# Patient Record
Sex: Female | Born: 1960
Health system: Southern US, Community
[De-identification: ages and names within clinical notes are randomized; demographics above are authoritative.]

## PROBLEM LIST (undated history)

## (undated) DIAGNOSIS — I10 Essential (primary) hypertension: Secondary | ICD-10-CM

## (undated) DIAGNOSIS — E785 Hyperlipidemia, unspecified: Secondary | ICD-10-CM

## (undated) DIAGNOSIS — M1990 Unspecified osteoarthritis, unspecified site: Secondary | ICD-10-CM

## (undated) DIAGNOSIS — M2550 Pain in unspecified joint: Secondary | ICD-10-CM

## (undated) DIAGNOSIS — T7840XA Allergy, unspecified, initial encounter: Secondary | ICD-10-CM

## (undated) DIAGNOSIS — K5900 Constipation, unspecified: Secondary | ICD-10-CM

## (undated) HISTORY — PX: COLONOSCOPY: SHX174

## (undated) HISTORY — DX: Allergy, unspecified, initial encounter: T78.40XA

## (undated) HISTORY — DX: Pain in unspecified joint: M25.50

## (undated) HISTORY — DX: Constipation, unspecified: K59.00

## (undated) HISTORY — DX: Hyperlipidemia, unspecified: E78.5

## (undated) HISTORY — DX: Unspecified osteoarthritis, unspecified site: M19.90

## (undated) HISTORY — DX: Essential (primary) hypertension: I10

---

## 2009-03-14 HISTORY — PX: CHOLECYSTECTOMY: SHX55

## 2011-12-15 ENCOUNTER — Encounter: Payer: Self-pay | Admitting: Family Medicine

## 2011-12-15 ENCOUNTER — Other Ambulatory Visit (HOSPITAL_COMMUNITY)
Admission: RE | Admit: 2011-12-15 | Discharge: 2011-12-15 | Disposition: A | Discharge status: Home / Self Care | Payer: 59 | Source: Ambulatory Visit | Attending: Family Medicine | Admitting: Family Medicine

## 2011-12-15 ENCOUNTER — Ambulatory Visit (INDEPENDENT_AMBULATORY_CARE_PROVIDER_SITE_OTHER): Payer: 59 | Admitting: Family Medicine

## 2011-12-15 VITALS — BP 132/88 | HR 57 | Temp 98.1°F | Ht 64.5 in | Wt 160.6 lb

## 2011-12-15 DIAGNOSIS — E785 Hyperlipidemia, unspecified: Secondary | ICD-10-CM

## 2011-12-15 DIAGNOSIS — Z01419 Encounter for gynecological examination (general) (routine) without abnormal findings: Secondary | ICD-10-CM | POA: Insufficient documentation

## 2011-12-15 DIAGNOSIS — Z124 Encounter for screening for malignant neoplasm of cervix: Secondary | ICD-10-CM

## 2011-12-15 DIAGNOSIS — Z0000 Encounter for general adult medical examination without abnormal findings: Secondary | ICD-10-CM

## 2011-12-15 DIAGNOSIS — I10 Essential (primary) hypertension: Secondary | ICD-10-CM

## 2011-12-15 LAB — BASIC METABOLIC PANEL
BUN: 15 mg/dL (ref 6–23)
CO2: 30 mEq/L (ref 19–32)
Calcium: 9.2 mg/dL (ref 8.4–10.5)
Creatinine, Ser: 0.9 mg/dL (ref 0.4–1.2)
GFR: 67.79 mL/min (ref >60.00)
Glucose, Bld: 83 mg/dL (ref 70–99)

## 2011-12-15 LAB — POCT URINALYSIS DIPSTICK
Blood, UA: NEGATIVE
Nitrite, UA: NEGATIVE
Protein, UA: NEGATIVE
Spec Grav, UA: <=1.005
Urobilinogen, UA: 0.2

## 2011-12-15 LAB — CBC WITH DIFFERENTIAL/PLATELET
Basophils Absolute: 0 10*3/uL (ref 0.0–0.1)
Eosinophils Absolute: 0.1 10*3/uL (ref 0.0–0.7)
Hemoglobin: 13.8 g/dL (ref 12.0–15.0)
Lymphocytes Relative: 33.5 % (ref 12.0–46.0)
MCHC: 33.5 g/dL (ref 30.0–36.0)
Monocytes Relative: 7.1 % (ref 3.0–12.0)
Neutro Abs: 2.8 10*3/uL (ref 1.4–7.7)
Neutrophils Relative %: 56.7 % (ref 43.0–77.0)
Platelets: 210 10*3/uL (ref 150.0–400.0)
RDW: 13 % (ref 11.5–14.6)

## 2011-12-15 LAB — LDL CHOLESTEROL, DIRECT: Direct LDL: 159.4 mg/dL

## 2011-12-15 LAB — HEPATIC FUNCTION PANEL
Albumin: 3.9 g/dL (ref 3.5–5.2)
Alkaline Phosphatase: 87 U/L (ref 39–117)
Total Bilirubin: 0.9 mg/dL (ref 0.3–1.2)

## 2011-12-15 LAB — LIPID PANEL
HDL: 49 mg/dL (ref >39.00)
Total CHOL/HDL Ratio: 5
VLDL: 23 mg/dL (ref 0.0–40.0)

## 2011-12-15 LAB — MICROALBUMIN / CREATININE URINE RATIO: Microalb, Ur: 0.2 mg/dL (ref 0.0–1.9)

## 2011-12-15 NOTE — Patient Instructions (Signed)
Preventive Care for Adults, Female A healthy lifestyle and preventive care can promote health and wellness. Preventive health guidelines for women include the following key practices.  A routine yearly physical is a good way to check with your caregiver about your health and preventive screening. It is a chance to share any concerns and updates on your health, and to receive a thorough exam.  Visit your dentist for a routine exam and preventive care every 6 months. Brush your teeth twice a day and floss once a day. Good oral hygiene prevents tooth decay and gum disease.  The frequency of eye exams is based on your age, health, family medical history, use of contact lenses, and other factors. Follow your caregiver's recommendations for frequency of eye exams.  Eat a healthy diet. Foods like vegetables, fruits, whole grains, low-fat dairy products, and lean protein foods contain the nutrients you need without too many calories. Decrease your intake of foods high in solid fats, added sugars, and salt. Eat the right amount of calories for you.Get information about a proper diet from your caregiver, if necessary.  Regular physical exercise is one of the most important things you can do for your health. Most adults should get at least 150 minutes of moderate-intensity exercise (any activity that increases your heart rate and causes you to sweat) each week. In addition, most adults need muscle-strengthening exercises on 2 or more days a week.  Maintain a healthy weight. The body mass index (BMI) is a screening tool to identify possible weight problems. It provides an estimate of body fat based on height and weight. Your caregiver can help determine your BMI, and can help you achieve or maintain a healthy weight.For adults 20 years and older:  A BMI below 18.5 is considered underweight.  A BMI of 18.5 to 24.9 is normal.  A BMI of 25 to 29.9 is considered overweight.  A BMI of 30 and above is  considered obese.  Maintain normal blood lipids and cholesterol levels by exercising and minimizing your intake of saturated fat. Eat a balanced diet with plenty of fruit and vegetables. Blood tests for lipids and cholesterol should begin at age 20 and be repeated every 5 years. If your lipid or cholesterol levels are high, you are over 50, or you are at high risk for heart disease, you may need your cholesterol levels checked more frequently.Ongoing high lipid and cholesterol levels should be treated with medicines if diet and exercise are not effective.  If you smoke, find out from your caregiver how to quit. If you do not use tobacco, do not start.  If you are pregnant, do not drink alcohol. If you are breastfeeding, be very cautious about drinking alcohol. If you are not pregnant and choose to drink alcohol, do not exceed 1 drink per day. One drink is considered to be 12 ounces (355 mL) of beer, 5 ounces (148 mL) of wine, or 1.5 ounces (44 mL) of liquor.  Avoid use of street drugs. Do not share needles with anyone. Ask for help if you need support or instructions about stopping the use of drugs.  High blood pressure causes heart disease and increases the risk of stroke. Your blood pressure should be checked at least every 1 to 2 years. Ongoing high blood pressure should be treated with medicines if weight loss and exercise are not effective.  If you are 55 to 53 years old, ask your caregiver if you should take aspirin to prevent strokes.  Diabetes   screening involves taking a blood sample to check your fasting blood sugar level. This should be done once every 3 years, after age 45, if you are within normal weight and without risk factors for diabetes. Testing should be considered at a younger age or be carried out more frequently if you are overweight and have at least 1 risk factor for diabetes.  Breast cancer screening is essential preventive care for women. You should practice "breast  self-awareness." This means understanding the normal appearance and feel of your breasts and may include breast self-examination. Any changes detected, no matter how small, should be reported to a caregiver. Women in their 20s and 30s should have a clinical breast exam (CBE) by a caregiver as part of a regular health exam every 1 to 3 years. After age 40, women should have a CBE every year. Starting at age 40, women should consider having a mammography (breast X-ray test) every year. Women who have a family history of breast cancer should talk to their caregiver about genetic screening. Women at a high risk of breast cancer should talk to their caregivers about having magnetic resonance imaging (MRI) and a mammography every year.  The Pap test is a screening test for cervical cancer. A Pap test can show cell changes on the cervix that might become cervical cancer if left untreated. A Pap test is a procedure in which cells are obtained and examined from the lower end of the uterus (cervix).  Women should have a Pap test starting at age 21.  Between ages 21 and 29, Pap tests should be repeated every 2 years.  Beginning at age 30, you should have a Pap test every 3 years as long as the past 3 Pap tests have been normal.  Some women have medical problems that increase the chance of getting cervical cancer. Talk to your caregiver about these problems. It is especially important to talk to your caregiver if a new problem develops soon after your last Pap test. In these cases, your caregiver may recommend more frequent screening and Pap tests.  The above recommendations are the same for women who have or have not gotten the vaccine for human papillomavirus (HPV).  If you had a hysterectomy for a problem that was not cancer or a condition that could lead to cancer, then you no longer need Pap tests. Even if you no longer need a Pap test, a regular exam is a good idea to make sure no other problems are  starting.  If you are between ages 65 and 70, and you have had normal Pap tests going back 10 years, you no longer need Pap tests. Even if you no longer need a Pap test, a regular exam is a good idea to make sure no other problems are starting.  If you have had past treatment for cervical cancer or a condition that could lead to cancer, you need Pap tests and screening for cancer for at least 20 years after your treatment.  If Pap tests have been discontinued, risk factors (such as a new sexual partner) need to be reassessed to determine if screening should be resumed.  The HPV test is an additional test that may be used for cervical cancer screening. The HPV test looks for the virus that can cause the cell changes on the cervix. The cells collected during the Pap test can be tested for HPV. The HPV test could be used to screen women aged 30 years and older, and should   be used in women of any age who have unclear Pap test results. After the age of 30, women should have HPV testing at the same frequency as a Pap test.  Colorectal cancer can be detected and often prevented. Most routine colorectal cancer screening begins at the age of 50 and continues through age 75. However, your caregiver may recommend screening at an earlier age if you have risk factors for colon cancer. On a yearly basis, your caregiver may provide home test kits to check for hidden blood in the stool. Use of a small camera at the end of a tube, to directly examine the colon (sigmoidoscopy or colonoscopy), can detect the earliest forms of colorectal cancer. Talk to your caregiver about this at age 50, when routine screening begins. Direct examination of the colon should be repeated every 5 to 10 years through age 75, unless early forms of pre-cancerous polyps or small growths are found.  Hepatitis C blood testing is recommended for all people born from 1945 through 1965 and any individual with known risks for hepatitis C.  Practice  safe sex. Use condoms and avoid high-risk sexual practices to reduce the spread of sexually transmitted infections (STIs). STIs include gonorrhea, chlamydia, syphilis, trichomonas, herpes, HPV, and human immunodeficiency virus (HIV). Herpes, HIV, and HPV are viral illnesses that have no cure. They can result in disability, cancer, and death. Sexually active women aged 25 and younger should be checked for chlamydia. Older women with new or multiple partners should also be tested for chlamydia. Testing for other STIs is recommended if you are sexually active and at increased risk.  Osteoporosis is a disease in which the bones lose minerals and strength with aging. This can result in serious bone fractures. The risk of osteoporosis can be identified using a bone density scan. Women ages 65 and over and women at risk for fractures or osteoporosis should discuss screening with their caregivers. Ask your caregiver whether you should take a calcium supplement or vitamin D to reduce the rate of osteoporosis.  Menopause can be associated with physical symptoms and risks. Hormone replacement therapy is available to decrease symptoms and risks. You should talk to your caregiver about whether hormone replacement therapy is right for you.  Use sunscreen with sun protection factor (SPF) of 30 or more. Apply sunscreen liberally and repeatedly throughout the day. You should seek shade when your shadow is shorter than you. Protect yourself by wearing long sleeves, pants, a wide-brimmed hat, and sunglasses year round, whenever you are outdoors.  Once a month, do a whole body skin exam, using a mirror to look at the skin on your back. Notify your caregiver of new moles, moles that have irregular borders, moles that are larger than a pencil eraser, or moles that have changed in shape or color.  Stay current with required immunizations.  Influenza. You need a dose every fall (or winter). The composition of the flu vaccine  changes each year, so being vaccinated once is not enough.  Pneumococcal polysaccharide. You need 1 to 2 doses if you smoke cigarettes or if you have certain chronic medical conditions. You need 1 dose at age 65 (or older) if you have never been vaccinated.  Tetanus, diphtheria, pertussis (Tdap, Td). Get 1 dose of Tdap vaccine if you are younger than age 65, are over 65 and have contact with an infant, are a healthcare worker, are pregnant, or simply want to be protected from whooping cough. After that, you need a Td   booster dose every 10 years. Consult your caregiver if you have not had at least 3 tetanus and diphtheria-containing shots sometime in your life or have a deep or dirty wound.  HPV. You need this vaccine if you are a woman age 26 or younger. The vaccine is given in 3 doses over 6 months.  Measles, mumps, rubella (MMR). You need at least 1 dose of MMR if you were born in 1957 or later. You may also need a second dose.  Meningococcal. If you are age 19 to 21 and a first-year college student living in a residence hall, or have one of several medical conditions, you need to get vaccinated against meningococcal disease. You may also need additional booster doses.  Zoster (shingles). If you are age 60 or older, you should get this vaccine.  Varicella (chickenpox). If you have never had chickenpox or you were vaccinated but received only 1 dose, talk to your caregiver to find out if you need this vaccine.  Hepatitis A. You need this vaccine if you have a specific risk factor for hepatitis A virus infection or you simply wish to be protected from this disease. The vaccine is usually given as 2 doses, 6 to 18 months apart.  Hepatitis B. You need this vaccine if you have a specific risk factor for hepatitis B virus infection or you simply wish to be protected from this disease. The vaccine is given in 3 doses, usually over 6 months. Preventive Services / Frequency Ages 19 to 39  Blood  pressure check.** / Every 1 to 2 years.  Lipid and cholesterol check.** / Every 5 years beginning at age 20.  Clinical breast exam.** / Every 3 years for women in their 20s and 30s.  Pap test.** / Every 2 years from ages 21 through 29. Every 3 years starting at age 30 through age 65 or 70 with a history of 3 consecutive normal Pap tests.  HPV screening.** / Every 3 years from ages 30 through ages 65 to 70 with a history of 3 consecutive normal Pap tests.  Hepatitis C blood test.** / For any individual with known risks for hepatitis C.  Skin self-exam. / Monthly.  Influenza immunization.** / Every year.  Pneumococcal polysaccharide immunization.** / 1 to 2 doses if you smoke cigarettes or if you have certain chronic medical conditions.  Tetanus, diphtheria, pertussis (Tdap, Td) immunization. / A one-time dose of Tdap vaccine. After that, you need a Td booster dose every 10 years.  HPV immunization. / 3 doses over 6 months, if you are 26 and younger.  Measles, mumps, rubella (MMR) immunization. / You need at least 1 dose of MMR if you were born in 1957 or later. You may also need a second dose.  Meningococcal immunization. / 1 dose if you are age 19 to 21 and a first-year college student living in a residence hall, or have one of several medical conditions, you need to get vaccinated against meningococcal disease. You may also need additional booster doses.  Varicella immunization.** / Consult your caregiver.  Hepatitis A immunization.** / Consult your caregiver. 2 doses, 6 to 18 months apart.  Hepatitis B immunization.** / Consult your caregiver. 3 doses usually over 6 months. Ages 40 to 64  Blood pressure check.** / Every 1 to 2 years.  Lipid and cholesterol check.** / Every 5 years beginning at age 20.  Clinical breast exam.** / Every year after age 40.  Mammogram.** / Every year beginning at age 40   and continuing for as long as you are in good health. Consult with your  caregiver.  Pap test.** / Every 3 years starting at age 30 through age 65 or 70 with a history of 3 consecutive normal Pap tests.  HPV screening.** / Every 3 years from ages 30 through ages 65 to 70 with a history of 3 consecutive normal Pap tests.  Fecal occult blood test (FOBT) of stool. / Every year beginning at age 50 and continuing until age 75. You may not need to do this test if you get a colonoscopy every 10 years.  Flexible sigmoidoscopy or colonoscopy.** / Every 5 years for a flexible sigmoidoscopy or every 10 years for a colonoscopy beginning at age 50 and continuing until age 75.  Hepatitis C blood test.** / For all people born from 1945 through 1965 and any individual with known risks for hepatitis C.  Skin self-exam. / Monthly.  Influenza immunization.** / Every year.  Pneumococcal polysaccharide immunization.** / 1 to 2 doses if you smoke cigarettes or if you have certain chronic medical conditions.  Tetanus, diphtheria, pertussis (Tdap, Td) immunization.** / A one-time dose of Tdap vaccine. After that, you need a Td booster dose every 10 years.  Measles, mumps, rubella (MMR) immunization. / You need at least 1 dose of MMR if you were born in 1957 or later. You may also need a second dose.  Varicella immunization.** / Consult your caregiver.  Meningococcal immunization.** / Consult your caregiver.  Hepatitis A immunization.** / Consult your caregiver. 2 doses, 6 to 18 months apart.  Hepatitis B immunization.** / Consult your caregiver. 3 doses, usually over 6 months. Ages 65 and over  Blood pressure check.** / Every 1 to 2 years.  Lipid and cholesterol check.** / Every 5 years beginning at age 20.  Clinical breast exam.** / Every year after age 40.  Mammogram.** / Every year beginning at age 40 and continuing for as long as you are in good health. Consult with your caregiver.  Pap test.** / Every 3 years starting at age 30 through age 65 or 70 with a 3  consecutive normal Pap tests. Testing can be stopped between 65 and 70 with 3 consecutive normal Pap tests and no abnormal Pap or HPV tests in the past 10 years.  HPV screening.** / Every 3 years from ages 30 through ages 65 or 70 with a history of 3 consecutive normal Pap tests. Testing can be stopped between 65 and 70 with 3 consecutive normal Pap tests and no abnormal Pap or HPV tests in the past 10 years.  Fecal occult blood test (FOBT) of stool. / Every year beginning at age 50 and continuing until age 75. You may not need to do this test if you get a colonoscopy every 10 years.  Flexible sigmoidoscopy or colonoscopy.** / Every 5 years for a flexible sigmoidoscopy or every 10 years for a colonoscopy beginning at age 50 and continuing until age 75.  Hepatitis C blood test.** / For all people born from 1945 through 1965 and any individual with known risks for hepatitis C.  Osteoporosis screening.** / A one-time screening for women ages 65 and over and women at risk for fractures or osteoporosis.  Skin self-exam. / Monthly.  Influenza immunization.** / Every year.  Pneumococcal polysaccharide immunization.** / 1 dose at age 65 (or older) if you have never been vaccinated.  Tetanus, diphtheria, pertussis (Tdap, Td) immunization. / A one-time dose of Tdap vaccine if you are over   65 and have contact with an infant, are a healthcare worker, or simply want to be protected from whooping cough. After that, you need a Td booster dose every 10 years.  Varicella immunization.** / Consult your caregiver.  Meningococcal immunization.** / Consult your caregiver.  Hepatitis A immunization.** / Consult your caregiver. 2 doses, 6 to 18 months apart.  Hepatitis B immunization.** / Check with your caregiver. 3 doses, usually over 6 months. ** Family history and personal history of risk and conditions may change your caregiver's recommendations. Document Released: 04/26/2001 Document Revised: 05/23/2011  Document Reviewed: 07/26/2010 [employer] Patient Information 2013 [employer].  

## 2011-12-15 NOTE — Progress Notes (Signed)
Subjective:     Tami Monroe is a 51 y.o. female and is here for a comprehensive physical exam. The patient reports problems - mild headache-- she thought it might be her bp.----she also has calf spasms in R leg that come and go.   Usually at night but did occur 1x during day.  She could see the spasms.    History   Social History  . Marital Status: Single    Spouse Name: N/A    Number of Children: N/A  . Years of Education: N/A   Occupational History  . Not on file.   Social History Main Topics  . Smoking status: Never Smoker   . Smokeless tobacco: Never Used  . Alcohol Use: Yes     OCC  . Drug Use: No  . Sexually Active: Not on file   Other Topics Concern  . Not on file   Social History Narrative  . No narrative on file   No health maintenance topics applied.  The following portions of the patient's history were reviewed and updated as appropriate:  She  has a past medical history of Allergy; Hypertension; and Hyperlipidemia. She  does not have a problem list on file. She  has past surgical history that includes Cholecystectomy (2011). Her family history includes Alcohol abuse in her father; Arthritis in her mother; Cancer in an unspecified family member; Diabetes in her mother; Heart disease in her mother; Hyperlipidemia in her mother; Hypertension in her mother; and Lung cancer in her father. She  reports that she has never smoked. She has never used smokeless tobacco. She reports that she drinks alcohol. She reports that she does not use illicit drugs. She currently has no medications in their medication list. No current outpatient prescriptions on file prior to visit.   She  has no known allergies..  Review of Systems Review of Systems  Constitutional: Negative for activity change, appetite change and fatigue.  HENT: Negative for hearing loss, congestion, tinnitus and ear discharge.  dentist-- due Eyes: Negative for visual disturbance (see optho  q1y)--due Respiratory: Negative for cough, chest tightness and shortness of breath.   Cardiovascular: Negative for chest pain, palpitations and leg swelling.  Gastrointestinal: Negative for abdominal pain, diarrhea, constipation and abdominal distention.  Genitourinary: Negative for urgency, frequency, decreased urine volume and difficulty urinating.  Musculoskeletal: Negative for back pain, arthralgias and gait problem.  Skin: Negative for color change, pallor and rash.  Neurological: Negative for dizziness, light-headedness, numbness and headaches.  Hematological: Negative for adenopathy. Does not bruise/bleed easily.  Psychiatric/Behavioral: Negative for suicidal ideas, confusion, sleep disturbance, self-injury, dysphoric mood, decreased concentration and agitation.       Objective:    BP 132/88  Pulse 57  Temp 98.1 F (36.7 C) (Oral)  Ht 5' 4.5" (1.638 m)  Wt 160 lb 9.6 oz (72.848 kg)  BMI 27.14 kg/m2  SpO2 97% General appearance: alert, cooperative, appears stated age and no distress Head: Normocephalic, without obvious abnormality, atraumatic Eyes: negative findings: lids and lashes normal, conjunctivae and sclerae normal and pupils equal, round, reactive to light and accomodation, positive findings: na Ears: normal TM's and external ear canals both ears Nose: Nares normal. Septum midline. Mucosa normal. No drainage or sinus tenderness. Throat: lips, mucosa, and tongue normal; teeth and gums normal Neck: no adenopathy, no carotid bruit, no JVD, supple, symmetrical, trachea midline and thyroid not enlarged, symmetric, no tenderness/mass/nodules Back: symmetric, no curvature. ROM normal. No CVA tenderness. Lungs: clear to auscultation bilaterally Breasts:  normal appearance, no masses or tenderness Heart: regular rate and rhythm, S1, S2 normal, no murmur, click, rub or gallop Abdomen: soft, non-tender; bowel sounds normal; no masses,  no organomegaly Pelvic: cervix normal in  appearance, external genitalia normal, no adnexal masses or tenderness, no cervical motion tenderness, rectovaginal septum normal, uterus normal size, shape, and consistency and vagina normal without discharge Extremities: extremities normal, atraumatic, no cyanosis or edema Pulses: 2+ and symmetric Skin: Skin color, texture, turgor normal. No rashes or lesions Lymph nodes: Cervical, supraclavicular, and axillary nodes normal. Neurologic: Alert and oriented X 3, normal strength and tone. Normal symmetric reflexes. Normal coordination and gait psych-- no anxiety, depression    Assessment:    Healthy female exam.      Plan:    ghm utd Check labs See After Visit Summary for Counseling Recommendations

## 2011-12-20 ENCOUNTER — Telehealth: Payer: Self-pay

## 2011-12-20 NOTE — Telephone Encounter (Signed)
e

## 2012-01-10 ENCOUNTER — Ambulatory Visit (HOSPITAL_BASED_OUTPATIENT_CLINIC_OR_DEPARTMENT_OTHER)
Admission: RE | Admit: 2012-01-10 | Discharge: 2012-01-10 | Disposition: A | Discharge status: Home / Self Care | Payer: 59 | Source: Ambulatory Visit | Attending: Family Medicine | Admitting: Family Medicine

## 2012-01-10 DIAGNOSIS — Z1231 Encounter for screening mammogram for malignant neoplasm of breast: Secondary | ICD-10-CM | POA: Insufficient documentation

## 2012-01-10 DIAGNOSIS — Z0000 Encounter for general adult medical examination without abnormal findings: Secondary | ICD-10-CM

## 2012-06-19 ENCOUNTER — Other Ambulatory Visit (INDEPENDENT_AMBULATORY_CARE_PROVIDER_SITE_OTHER): Payer: BC Managed Care – PPO

## 2012-06-19 DIAGNOSIS — E785 Hyperlipidemia, unspecified: Secondary | ICD-10-CM

## 2012-06-19 LAB — HEPATIC FUNCTION PANEL
ALT: 13 U/L (ref 0–35)
Bilirubin, Direct: 0 mg/dL (ref 0.0–0.3)
Total Bilirubin: 0.6 mg/dL (ref 0.3–1.2)

## 2012-06-19 LAB — LIPID PANEL
HDL: 44 mg/dL (ref >39.00)
Total CHOL/HDL Ratio: 5
VLDL: 20.4 mg/dL (ref 0.0–40.0)

## 2014-09-16 ENCOUNTER — Telehealth: Payer: Self-pay | Admitting: Family Medicine

## 2014-09-16 NOTE — Telephone Encounter (Signed)
pre visit letter mailed 09/16/14 °

## 2014-10-07 ENCOUNTER — Other Ambulatory Visit (HOSPITAL_COMMUNITY)
Admission: RE | Admit: 2014-10-07 | Discharge: 2014-10-07 | Disposition: A | Discharge status: Home / Self Care | Payer: BLUE CROSS/BLUE SHIELD | Source: Ambulatory Visit | Attending: Family Medicine | Admitting: Family Medicine

## 2014-10-07 ENCOUNTER — Encounter: Payer: Self-pay | Admitting: Family Medicine

## 2014-10-07 ENCOUNTER — Ambulatory Visit (INDEPENDENT_AMBULATORY_CARE_PROVIDER_SITE_OTHER): Payer: BLUE CROSS/BLUE SHIELD | Admitting: Family Medicine

## 2014-10-07 VITALS — BP 124/82 | HR 61 | Temp 98.1°F | Ht 65.0 in | Wt 197.4 lb

## 2014-10-07 DIAGNOSIS — Z124 Encounter for screening for malignant neoplasm of cervix: Secondary | ICD-10-CM

## 2014-10-07 DIAGNOSIS — Z01419 Encounter for gynecological examination (general) (routine) without abnormal findings: Secondary | ICD-10-CM | POA: Insufficient documentation

## 2014-10-07 DIAGNOSIS — Z1151 Encounter for screening for human papillomavirus (HPV): Secondary | ICD-10-CM | POA: Diagnosis present

## 2014-10-07 DIAGNOSIS — Z0000 Encounter for general adult medical examination without abnormal findings: Secondary | ICD-10-CM | POA: Diagnosis not present

## 2014-10-07 DIAGNOSIS — Z1231 Encounter for screening mammogram for malignant neoplasm of breast: Secondary | ICD-10-CM

## 2014-10-07 LAB — LIPID PANEL
CHOL/HDL RATIO: 5
Cholesterol: 284 mg/dL — ABNORMAL HIGH (ref 0–200)
HDL: 57.2 mg/dL (ref >39.00)
LDL Cholesterol: 205 mg/dL — ABNORMAL HIGH (ref 0–99)
NonHDL: 226.8
Triglycerides: 108 mg/dL (ref 0.0–149.0)
VLDL: 21.6 mg/dL (ref 0.0–40.0)

## 2014-10-07 LAB — BASIC METABOLIC PANEL
BUN: 18 mg/dL (ref 6–23)
CALCIUM: 9.5 mg/dL (ref 8.4–10.5)
CO2: 29 meq/L (ref 19–32)
CREATININE: 0.91 mg/dL (ref 0.40–1.20)
Chloride: 101 mEq/L (ref 96–112)
GFR: 67.94 mL/min (ref >60.00)
Glucose, Bld: 86 mg/dL (ref 70–99)
Potassium: 3.7 mEq/L (ref 3.5–5.1)
SODIUM: 138 meq/L (ref 135–145)

## 2014-10-07 LAB — CBC WITH DIFFERENTIAL/PLATELET
Basophils Absolute: 0 10*3/uL (ref 0.0–0.1)
Basophils Relative: 0.5 % (ref 0.0–3.0)
EOS ABS: 0.1 10*3/uL (ref 0.0–0.7)
Eosinophils Relative: 2.6 % (ref 0.0–5.0)
HEMATOCRIT: 43.5 % (ref 36.0–46.0)
Hemoglobin: 14.9 g/dL (ref 12.0–15.0)
Lymphocytes Relative: 37.2 % (ref 12.0–46.0)
Lymphs Abs: 2.1 10*3/uL (ref 0.7–4.0)
MCHC: 34.1 g/dL (ref 30.0–36.0)
MCV: 91.1 fl (ref 78.0–100.0)
MONO ABS: 0.4 10*3/uL (ref 0.1–1.0)
MONOS PCT: 7.4 % (ref 3.0–12.0)
Neutro Abs: 3 10*3/uL (ref 1.4–7.7)
Neutrophils Relative %: 52.3 % (ref 43.0–77.0)
PLATELETS: 252 10*3/uL (ref 150.0–400.0)
RBC: 4.78 Mil/uL (ref 3.87–5.11)
RDW: 13.4 % (ref 11.5–15.5)
WBC: 5.7 10*3/uL (ref 4.0–10.5)

## 2014-10-07 LAB — HEPATIC FUNCTION PANEL
ALK PHOS: 107 U/L (ref 39–117)
ALT: 19 U/L (ref 0–35)
AST: 21 U/L (ref 0–37)
Albumin: 4.5 g/dL (ref 3.5–5.2)
BILIRUBIN TOTAL: 0.7 mg/dL (ref 0.2–1.2)
Bilirubin, Direct: 0.2 mg/dL (ref 0.0–0.3)
TOTAL PROTEIN: 7.7 g/dL (ref 6.0–8.3)

## 2014-10-07 LAB — TSH: TSH: 1.84 u[IU]/mL (ref 0.35–4.50)

## 2014-10-07 NOTE — Patient Instructions (Signed)
Preventive Care for Adults A healthy lifestyle and preventive care can promote health and wellness. Preventive health guidelines for women include the following key practices.  A routine yearly physical is a good way to check with your health care provider about your health and preventive screening. It is a chance to share any concerns and updates on your health and to receive a thorough exam.  Visit your dentist for a routine exam and preventive care every 6 months. Brush your teeth twice a day and floss once a day. Good oral hygiene prevents tooth decay and gum disease.  The frequency of eye exams is based on your age, health, family medical history, use of contact lenses, and other factors. Follow your health care provider's recommendations for frequency of eye exams.  Eat a healthy diet. Foods like vegetables, fruits, whole grains, low-fat dairy products, and lean protein foods contain the nutrients you need without too many calories. Decrease your intake of foods high in solid fats, added sugars, and salt. Eat the right amount of calories for you.Get information about a proper diet from your health care provider, if necessary.  Regular physical exercise is one of the most important things you can do for your health. Most adults should get at least 150 minutes of moderate-intensity exercise (any activity that increases your heart rate and causes you to sweat) each week. In addition, most adults need muscle-strengthening exercises on 2 or more days a week.  Maintain a healthy weight. The body mass index (BMI) is a screening tool to identify possible weight problems. It provides an estimate of body fat based on height and weight. Your health care provider can find your BMI and can help you achieve or maintain a healthy weight.For adults 20 years and older:  A BMI below 18.5 is considered underweight.  A BMI of 18.5 to 24.9 is normal.  A BMI of 25 to 29.9 is considered overweight.  A BMI of  30 and above is considered obese.  Maintain normal blood lipids and cholesterol levels by exercising and minimizing your intake of saturated fat. Eat a balanced diet with plenty of fruit and vegetables. Blood tests for lipids and cholesterol should begin at age 76 and be repeated every 5 years. If your lipid or cholesterol levels are high, you are over 50, or you are at high risk for heart disease, you may need your cholesterol levels checked more frequently.Ongoing high lipid and cholesterol levels should be treated with medicines if diet and exercise are not working.  If you smoke, find out from your health care provider how to quit. If you do not use tobacco, do not start.  Lung cancer screening is recommended for adults aged 22-80 years who are at high risk for developing lung cancer because of a history of smoking. A yearly low-dose CT scan of the lungs is recommended for people who have at least a 30-pack-year history of smoking and are a current smoker or have quit within the past 15 years. A pack year of smoking is smoking an average of 1 pack of cigarettes a day for 1 year (for example: 1 pack a day for 30 years or 2 packs a day for 15 years). Yearly screening should continue until the smoker has stopped smoking for at least 15 years. Yearly screening should be stopped for people who develop a health problem that would prevent them from having lung cancer treatment.  If you are pregnant, do not drink alcohol. If you are breastfeeding,  be very cautious about drinking alcohol. If you are not pregnant and choose to drink alcohol, do not have more than 1 drink per day. One drink is considered to be 12 ounces (355 mL) of beer, 5 ounces (148 mL) of wine, or 1.5 ounces (44 mL) of liquor.  Avoid use of street drugs. Do not share needles with anyone. Ask for help if you need support or instructions about stopping the use of drugs.  High blood pressure causes heart disease and increases the risk of  stroke. Your blood pressure should be checked at least every 1 to 2 years. Ongoing high blood pressure should be treated with medicines if weight loss and exercise do not work.  If you are 3-86 years old, ask your health care provider if you should take aspirin to prevent strokes.  Diabetes screening involves taking a blood sample to check your fasting blood sugar level. This should be done once every 3 years, after age 67, if you are within normal weight and without risk factors for diabetes. Testing should be considered at a younger age or be carried out more frequently if you are overweight and have at least 1 risk factor for diabetes.  Breast cancer screening is essential preventive care for women. You should practice "breast self-awareness." This means understanding the normal appearance and feel of your breasts and may include breast self-examination. Any changes detected, no matter how small, should be reported to a health care provider. Women in their 8s and 30s should have a clinical breast exam (CBE) by a health care provider as part of a regular health exam every 1 to 3 years. After age 70, women should have a CBE every year. Starting at age 25, women should consider having a mammogram (breast X-ray test) every year. Women who have a family history of breast cancer should talk to their health care provider about genetic screening. Women at a high risk of breast cancer should talk to their health care providers about having an MRI and a mammogram every year.  Breast cancer gene (BRCA)-related cancer risk assessment is recommended for women who have family members with BRCA-related cancers. BRCA-related cancers include breast, ovarian, tubal, and peritoneal cancers. Having family members with these cancers may be associated with an increased risk for harmful changes (mutations) in the breast cancer genes BRCA1 and BRCA2. Results of the assessment will determine the need for genetic counseling and  BRCA1 and BRCA2 testing.  Routine pelvic exams to screen for cancer are no longer recommended for nonpregnant women who are considered low risk for cancer of the pelvic organs (ovaries, uterus, and vagina) and who do not have symptoms. Ask your health care provider if a screening pelvic exam is right for you.  If you have had past treatment for cervical cancer or a condition that could lead to cancer, you need Pap tests and screening for cancer for at least 20 years after your treatment. If Pap tests have been discontinued, your risk factors (such as having a new sexual partner) need to be reassessed to determine if screening should be resumed. Some women have medical problems that increase the chance of getting cervical cancer. In these cases, your health care provider may recommend more frequent screening and Pap tests.  The HPV test is an additional test that may be used for cervical cancer screening. The HPV test looks for the virus that can cause the cell changes on the cervix. The cells collected during the Pap test can be  tested for HPV. The HPV test could be used to screen women aged 30 years and older, and should be used in women of any age who have unclear Pap test results. After the age of 30, women should have HPV testing at the same frequency as a Pap test.  Colorectal cancer can be detected and often prevented. Most routine colorectal cancer screening begins at the age of 50 years and continues through age 75 years. However, your health care provider may recommend screening at an earlier age if you have risk factors for colon cancer. On a yearly basis, your health care provider may provide home test kits to check for hidden blood in the stool. Use of a small camera at the end of a tube, to directly examine the colon (sigmoidoscopy or colonoscopy), can detect the earliest forms of colorectal cancer. Talk to your health care provider about this at age 50, when routine screening begins. Direct  exam of the colon should be repeated every 5-10 years through age 75 years, unless early forms of pre-cancerous polyps or small growths are found.  People who are at an increased risk for hepatitis B should be screened for this virus. You are considered at high risk for hepatitis B if:  You were born in a country where hepatitis B occurs often. Talk with your health care provider about which countries are considered high risk.  Your parents were born in a high-risk country and you have not received a shot to protect against hepatitis B (hepatitis B vaccine).  You have HIV or AIDS.  You use needles to inject street drugs.  You live with, or have sex with, someone who has hepatitis B.  You get hemodialysis treatment.  You take certain medicines for conditions like cancer, organ transplantation, and autoimmune conditions.  Hepatitis C blood testing is recommended for all people born from 1945 through 1965 and any individual with known risks for hepatitis C.  Practice safe sex. Use condoms and avoid high-risk sexual practices to reduce the spread of sexually transmitted infections (STIs). STIs include gonorrhea, chlamydia, syphilis, trichomonas, herpes, HPV, and human immunodeficiency virus (HIV). Herpes, HIV, and HPV are viral illnesses that have no cure. They can result in disability, cancer, and death.  You should be screened for sexually transmitted illnesses (STIs) including gonorrhea and chlamydia if:  You are sexually active and are younger than 24 years.  You are older than 24 years and your health care provider tells you that you are at risk for this type of infection.  Your sexual activity has changed since you were last screened and you are at an increased risk for chlamydia or gonorrhea. Ask your health care provider if you are at risk.  If you are at risk of being infected with HIV, it is recommended that you take a prescription medicine daily to prevent HIV infection. This is  called preexposure prophylaxis (PrEP). You are considered at risk if:  You are a heterosexual woman, are sexually active, and are at increased risk for HIV infection.  You take drugs by injection.  You are sexually active with a partner who has HIV.  Talk with your health care provider about whether you are at high risk of being infected with HIV. If you choose to begin PrEP, you should first be tested for HIV. You should then be tested every 3 months for as long as you are taking PrEP.  Osteoporosis is a disease in which the bones lose minerals and strength   with aging. This can result in serious bone fractures or breaks. The risk of osteoporosis can be identified using a bone density scan. Women ages 65 years and over and women at risk for fractures or osteoporosis should discuss screening with their health care providers. Ask your health care provider whether you should take a calcium supplement or vitamin D to reduce the rate of osteoporosis.  Menopause can be associated with physical symptoms and risks. Hormone replacement therapy is available to decrease symptoms and risks. You should talk to your health care provider about whether hormone replacement therapy is right for you.  Use sunscreen. Apply sunscreen liberally and repeatedly throughout the day. You should seek shade when your shadow is shorter than you. Protect yourself by wearing long sleeves, pants, a wide-brimmed hat, and sunglasses year round, whenever you are outdoors.  Once a month, do a whole body skin exam, using a mirror to look at the skin on your back. Tell your health care provider of new moles, moles that have irregular borders, moles that are larger than a pencil eraser, or moles that have changed in shape or color.  Stay current with required vaccines (immunizations).  Influenza vaccine. All adults should be immunized every year.  Tetanus, diphtheria, and acellular pertussis (Td, Tdap) vaccine. Pregnant women should  receive 1 dose of Tdap vaccine during each pregnancy. The dose should be obtained regardless of the length of time since the last dose. Immunization is preferred during the 27th-36th week of gestation. An adult who has not previously received Tdap or who does not know her vaccine status should receive 1 dose of Tdap. This initial dose should be followed by tetanus and diphtheria toxoids (Td) booster doses every 10 years. Adults with an unknown or incomplete history of completing a 3-dose immunization series with Td-containing vaccines should begin or complete a primary immunization series including a Tdap dose. Adults should receive a Td booster every 10 years.  Varicella vaccine. An adult without evidence of immunity to varicella should receive 2 doses or a second dose if she has previously received 1 dose. Pregnant females who do not have evidence of immunity should receive the first dose after pregnancy. This first dose should be obtained before leaving the health care facility. The second dose should be obtained 4-8 weeks after the first dose.  Human papillomavirus (HPV) vaccine. Females aged 13-26 years who have not received the vaccine previously should obtain the 3-dose series. The vaccine is not recommended for use in pregnant females. However, pregnancy testing is not needed before receiving a dose. If a female is found to be pregnant after receiving a dose, no treatment is needed. In that case, the remaining doses should be delayed until after the pregnancy. Immunization is recommended for any person with an immunocompromised condition through the age of 26 years if she did not get any or all doses earlier. During the 3-dose series, the second dose should be obtained 4-8 weeks after the first dose. The third dose should be obtained 24 weeks after the first dose and 16 weeks after the second dose.  Zoster vaccine. One dose is recommended for adults aged 60 years or older unless certain conditions are  present.  Measles, mumps, and rubella (MMR) vaccine. Adults born before 1957 generally are considered immune to measles and mumps. Adults born in 1957 or later should have 1 or more doses of MMR vaccine unless there is a contraindication to the vaccine or there is laboratory evidence of immunity to   each of the three diseases. A routine second dose of MMR vaccine should be obtained at least 28 days after the first dose for students attending postsecondary schools, health care workers, or international travelers. People who received inactivated measles vaccine or an unknown type of measles vaccine during 1963-1967 should receive 2 doses of MMR vaccine. People who received inactivated mumps vaccine or an unknown type of mumps vaccine before 1979 and are at high risk for mumps infection should consider immunization with 2 doses of MMR vaccine. For females of childbearing age, rubella immunity should be determined. If there is no evidence of immunity, females who are not pregnant should be vaccinated. If there is no evidence of immunity, females who are pregnant should delay immunization until after pregnancy. Unvaccinated health care workers born before 1957 who lack laboratory evidence of measles, mumps, or rubella immunity or laboratory confirmation of disease should consider measles and mumps immunization with 2 doses of MMR vaccine or rubella immunization with 1 dose of MMR vaccine.  Pneumococcal 13-valent conjugate (PCV13) vaccine. When indicated, a person who is uncertain of her immunization history and has no record of immunization should receive the PCV13 vaccine. An adult aged 19 years or older who has certain medical conditions and has not been previously immunized should receive 1 dose of PCV13 vaccine. This PCV13 should be followed with a dose of pneumococcal polysaccharide (PPSV23) vaccine. The PPSV23 vaccine dose should be obtained at least 8 weeks after the dose of PCV13 vaccine. An adult aged 19  years or older who has certain medical conditions and previously received 1 or more doses of PPSV23 vaccine should receive 1 dose of PCV13. The PCV13 vaccine dose should be obtained 1 or more years after the last PPSV23 vaccine dose.  Pneumococcal polysaccharide (PPSV23) vaccine. When PCV13 is also indicated, PCV13 should be obtained first. All adults aged 65 years and older should be immunized. An adult younger than age 65 years who has certain medical conditions should be immunized. Any person who resides in a nursing home or long-term care facility should be immunized. An adult smoker should be immunized. People with an immunocompromised condition and certain other conditions should receive both PCV13 and PPSV23 vaccines. People with human immunodeficiency virus (HIV) infection should be immunized as soon as possible after diagnosis. Immunization during chemotherapy or radiation therapy should be avoided. Routine use of PPSV23 vaccine is not recommended for American Indians, [place] Natives, or people younger than 65 years unless there are medical conditions that require PPSV23 vaccine. When indicated, people who have unknown immunization and have no record of immunization should receive PPSV23 vaccine. One-time revaccination 5 years after the first dose of PPSV23 is recommended for people aged 19-64 years who have chronic kidney failure, nephrotic syndrome, asplenia, or immunocompromised conditions. People who received 1-2 doses of PPSV23 before age 65 years should receive another dose of PPSV23 vaccine at age 65 years or later if at least 5 years have passed since the previous dose. Doses of PPSV23 are not needed for people immunized with PPSV23 at or after age 65 years.  Meningococcal vaccine. Adults with asplenia or persistent complement component deficiencies should receive 2 doses of quadrivalent meningococcal conjugate (MenACWY-D) vaccine. The doses should be obtained at least 2 months apart.  Microbiologists working with certain meningococcal bacteria, military recruits, people at risk during an outbreak, and people who travel to or live in countries with a high rate of meningitis should be immunized. A first-year college student up through age   21 years who is living in a residence hall should receive a dose if she did not receive a dose on or after her 16th birthday. Adults who have certain high-risk conditions should receive one or more doses of vaccine.  Hepatitis A vaccine. Adults who wish to be protected from this disease, have certain high-risk conditions, work with hepatitis A-infected animals, work in hepatitis A research labs, or travel to or work in countries with a high rate of hepatitis A should be immunized. Adults who were previously unvaccinated and who anticipate close contact with an international adoptee during the first 60 days after arrival in the [place] from a country with a high rate of hepatitis A should be immunized.  Hepatitis B vaccine. Adults who wish to be protected from this disease, have certain high-risk conditions, may be exposed to blood or other infectious body fluids, are household contacts or sex partners of hepatitis B positive people, are clients or workers in certain care facilities, or travel to or work in countries with a high rate of hepatitis B should be immunized.  Haemophilus influenzae type b (Hib) vaccine. A previously unvaccinated person with asplenia or sickle cell disease or having a scheduled splenectomy should receive 1 dose of Hib vaccine. Regardless of previous immunization, a recipient of a hematopoietic stem cell transplant should receive a 3-dose series 6-12 months after her successful transplant. Hib vaccine is not recommended for adults with HIV infection. Preventive Services / Frequency Ages 64 to 68 years  Blood pressure check.** / Every 1 to 2 years.  Lipid and cholesterol check.** / Every 5 years beginning at age  22.  Clinical breast exam.** / Every 3 years for women in their 88s and 53s.  BRCA-related cancer risk assessment.** / For women who have family members with a BRCA-related cancer (breast, ovarian, tubal, or peritoneal cancers).  Pap test.** / Every 2 years from ages 90 through 51. Every 3 years starting at age 21 through age 56 or 3 with a history of 3 consecutive normal Pap tests.  HPV screening.** / Every 3 years from ages 24 through ages 1 to 46 with a history of 3 consecutive normal Pap tests.  Hepatitis C blood test.** / For any individual with known risks for hepatitis C.  Skin self-exam. / Monthly.  Influenza vaccine. / Every year.  Tetanus, diphtheria, and acellular pertussis (Tdap, Td) vaccine.** / Consult your health care provider. Pregnant women should receive 1 dose of Tdap vaccine during each pregnancy. 1 dose of Td every 10 years.  Varicella vaccine.** / Consult your health care provider. Pregnant females who do not have evidence of immunity should receive the first dose after pregnancy.  HPV vaccine. / 3 doses over 6 months, if 72 and younger. The vaccine is not recommended for use in pregnant females. However, pregnancy testing is not needed before receiving a dose.  Measles, mumps, rubella (MMR) vaccine.** / You need at least 1 dose of MMR if you were born in 1957 or later. You may also need a 2nd dose. For females of childbearing age, rubella immunity should be determined. If there is no evidence of immunity, females who are not pregnant should be vaccinated. If there is no evidence of immunity, females who are pregnant should delay immunization until after pregnancy.  Pneumococcal 13-valent conjugate (PCV13) vaccine.** / Consult your health care provider.  Pneumococcal polysaccharide (PPSV23) vaccine.** / 1 to 2 doses if you smoke cigarettes or if you have certain conditions.  Meningococcal vaccine.** /  1 dose if you are age 19 to 21 years and a first-year college  student living in a residence hall, or have one of several medical conditions, you need to get vaccinated against meningococcal disease. You may also need additional booster doses.  Hepatitis A vaccine.** / Consult your health care provider.  Hepatitis B vaccine.** / Consult your health care provider.  Haemophilus influenzae type b (Hib) vaccine.** / Consult your health care provider. Ages 40 to 64 years  Blood pressure check.** / Every 1 to 2 years.  Lipid and cholesterol check.** / Every 5 years beginning at age 20 years.  Lung cancer screening. / Every year if you are aged 55-80 years and have a 30-pack-year history of smoking and currently smoke or have quit within the past 15 years. Yearly screening is stopped once you have quit smoking for at least 15 years or develop a health problem that would prevent you from having lung cancer treatment.  Clinical breast exam.** / Every year after age 40 years.  BRCA-related cancer risk assessment.** / For women who have family members with a BRCA-related cancer (breast, ovarian, tubal, or peritoneal cancers).  Mammogram.** / Every year beginning at age 40 years and continuing for as long as you are in good health. Consult with your health care provider.  Pap test.** / Every 3 years starting at age 30 years through age 65 or 70 years with a history of 3 consecutive normal Pap tests.  HPV screening.** / Every 3 years from ages 30 years through ages 65 to 70 years with a history of 3 consecutive normal Pap tests.  Fecal occult blood test (FOBT) of stool. / Every year beginning at age 50 years and continuing until age 75 years. You may not need to do this test if you get a colonoscopy every 10 years.  Flexible sigmoidoscopy or colonoscopy.** / Every 5 years for a flexible sigmoidoscopy or every 10 years for a colonoscopy beginning at age 50 years and continuing until age 75 years.  Hepatitis C blood test.** / For all people born from 1945 through  1965 and any individual with known risks for hepatitis C.  Skin self-exam. / Monthly.  Influenza vaccine. / Every year.  Tetanus, diphtheria, and acellular pertussis (Tdap/Td) vaccine.** / Consult your health care provider. Pregnant women should receive 1 dose of Tdap vaccine during each pregnancy. 1 dose of Td every 10 years.  Varicella vaccine.** / Consult your health care provider. Pregnant females who do not have evidence of immunity should receive the first dose after pregnancy.  Zoster vaccine.** / 1 dose for adults aged 60 years or older.  Measles, mumps, rubella (MMR) vaccine.** / You need at least 1 dose of MMR if you were born in 1957 or later. You may also need a 2nd dose. For females of childbearing age, rubella immunity should be determined. If there is no evidence of immunity, females who are not pregnant should be vaccinated. If there is no evidence of immunity, females who are pregnant should delay immunization until after pregnancy.  Pneumococcal 13-valent conjugate (PCV13) vaccine.** / Consult your health care provider.  Pneumococcal polysaccharide (PPSV23) vaccine.** / 1 to 2 doses if you smoke cigarettes or if you have certain conditions.  Meningococcal vaccine.** / Consult your health care provider.  Hepatitis A vaccine.** / Consult your health care provider.  Hepatitis B vaccine.** / Consult your health care provider.  Haemophilus influenzae type b (Hib) vaccine.** / Consult your health care provider. Ages 65   years and over  Blood pressure check.** / Every 1 to 2 years.  Lipid and cholesterol check.** / Every 5 years beginning at age 22 years.  Lung cancer screening. / Every year if you are aged 73-80 years and have a 30-pack-year history of smoking and currently smoke or have quit within the past 15 years. Yearly screening is stopped once you have quit smoking for at least 15 years or develop a health problem that would prevent you from having lung cancer  treatment.  Clinical breast exam.** / Every year after age 4 years.  BRCA-related cancer risk assessment.** / For women who have family members with a BRCA-related cancer (breast, ovarian, tubal, or peritoneal cancers).  Mammogram.** / Every year beginning at age 40 years and continuing for as long as you are in good health. Consult with your health care provider.  Pap test.** / Every 3 years starting at age 9 years through age 34 or 91 years with 3 consecutive normal Pap tests. Testing can be stopped between 65 and 70 years with 3 consecutive normal Pap tests and no abnormal Pap or HPV tests in the past 10 years.  HPV screening.** / Every 3 years from ages 57 years through ages 64 or 45 years with a history of 3 consecutive normal Pap tests. Testing can be stopped between 65 and 70 years with 3 consecutive normal Pap tests and no abnormal Pap or HPV tests in the past 10 years.  Fecal occult blood test (FOBT) of stool. / Every year beginning at age 15 years and continuing until age 17 years. You may not need to do this test if you get a colonoscopy every 10 years.  Flexible sigmoidoscopy or colonoscopy.** / Every 5 years for a flexible sigmoidoscopy or every 10 years for a colonoscopy beginning at age 86 years and continuing until age 71 years.  Hepatitis C blood test.** / For all people born from [birth year] through 1967 and any individual with known risks for hepatitis C.  Osteoporosis screening.** / A one-time screening for women ages 83 years and over and women at risk for fractures or osteoporosis.  Skin self-exam. / Monthly.  Influenza vaccine. / Every year.  Tetanus, diphtheria, and acellular pertussis (Tdap/Td) vaccine.** / 1 dose of Td every 10 years.  Varicella vaccine.** / Consult your health care provider.  Zoster vaccine.** / 1 dose for adults aged 61 years or older.  Pneumococcal 13-valent conjugate (PCV13) vaccine.** / Consult your health care provider.  Pneumococcal  polysaccharide (PPSV23) vaccine.** / 1 dose for all adults aged 28 years and older.  Meningococcal vaccine.** / Consult your health care provider.  Hepatitis A vaccine.** / Consult your health care provider.  Hepatitis B vaccine.** / Consult your health care provider.  Haemophilus influenzae type b (Hib) vaccine.** / Consult your health care provider. ** Family history and personal history of risk and conditions may change your health care provider's recommendations. Document Released: 04/26/2001 Document Revised: 07/15/2013 Document Reviewed: 07/26/2010 [facility] Patient Information 2015 [place], [place]. This information is not intended to replace advice given to you by your health care provider. Make sure you discuss any questions you have with your health care provider.

## 2014-10-07 NOTE — Progress Notes (Signed)
Subjective:     Tami Monroe is a 54 y.o. female and is here for a comprehensive physical exam. The patient reports problems - struggling to lose weight.  History   Social History  . Marital Status: Single    Spouse Name: N/A  . Number of Children: N/A  . Years of Education: N/A   Occupational History  . 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 Smoking status: Never Smoker   . Smokeless tobacco: Never Used  . Alcohol Use: Yes     Comment: OCC  . Drug Use: No  . Sexual Activity:    Partners: Male   Other Topics Concern  . Not on file   Social History Narrative   Exercise---  Gym 3-4 x a week   Health Maintenance  Topic Date Due  . MAMMOGRAM  01/09/2014  . HIV Screening  10/07/2015 (Originally 08/28/1975)  . INFLUENZA VACCINE  10/13/2014  . PAP SMEAR  12/15/2014  . TETANUS/TDAP  04/08/2015  . COLONOSCOPY  03/14/2018    The following portions of the patient's history were reviewed and updated as appropriate:  She  has a past medical history of Allergy; Hypertension; and Hyperlipidemia. She  does not have a problem list on file. She  has past surgical history that includes Cholecystectomy (2011). Her family history includes Alcohol abuse in her father; Arthritis in her mother; Cancer in an other family member; Diabetes in her mother; Heart disease in her mother; Hyperlipidemia in her mother; Hypertension in her mother; Lung cancer in her father. She  reports that she has never smoked. She has never used smokeless tobacco. She reports that she drinks alcohol. She reports that she does not use illicit drugs. She currently has no medications in their medication list. No current outpatient prescriptions on file prior to visit.   No current facility-administered medications on file prior to visit.   She has No Known Allergies..  Review of Systems Review of Systems  Constitutional: Negative for activity change, appetite change and fatigue.   HENT: Negative for hearing loss, congestion, tinnitus and ear discharge.  dentist q78m Eyes: Negative for visual disturbance (see optho q1y -- vision corrected to 20/20 with glasses).  Respiratory: Negative for cough, chest tightness and shortness of breath.   Cardiovascular: Negative for chest pain, palpitations and leg swelling.  Gastrointestinal: Negative for abdominal pain, diarrhea, constipation and abdominal distention.  Genitourinary: Negative for urgency, frequency, decreased urine volume and difficulty urinating.  Musculoskeletal: Negative for back pain, arthralgias and gait problem.  Skin: Negative for color change, pallor and rash.  Neurological: Negative for dizziness, light-headedness, numbness and headaches.  Hematological: Negative for adenopathy. Does not bruise/bleed easily.  Psychiatric/Behavioral: Negative for suicidal ideas, confusion, sleep disturbance, self-injury, dysphoric mood, decreased concentration and agitation.       Objective:    BP 124/82 mmHg  Pulse 61  Temp(Src) 98.1 F (36.7 C) (Oral)  Ht 5\' 5"  (1.651 m)  Wt 197 lb 6.4 oz (89.54 kg)  BMI 32.85 kg/m2  SpO2 96% General appearance: alert, cooperative, appears stated age and no distress Head: Normocephalic, without obvious abnormality, atraumatic Eyes: conjunctivae/corneas clear. PERRL, EOM's intact. Fundi benign. Ears: normal TM's and external ear canals both ears Nose: Nares normal. Septum midline. Mucosa normal. No drainage or sinus tenderness. Throat: lips, mucosa, and tongue normal; teeth and gums normal Neck: no adenopathy, no carotid bruit, no JVD, supple, symmetrical, trachea midline and thyroid not enlarged, symmetric, no tenderness/mass/nodules  Back: symmetric, no curvature. ROM normal. No CVA tenderness. Lungs: clear to auscultation bilaterally Breasts: normal appearance, no masses or tenderness Heart: S1, S2 normal Abdomen: soft, non-tender; bowel sounds normal; no masses,  no  organomegaly Pelvic: cervix normal in appearance, external genitalia normal, no adnexal masses or tenderness, no cervical motion tenderness, rectovaginal septum normal, uterus normal size, shape, and consistency, vagina normal without discharge and pap done, rectal heme neg brown stool Extremities: extremities normal, atraumatic, no cyanosis or edema Pulses: 2+ and symmetric Skin: Skin color, texture, turgor normal. No rashes or lesions Lymph nodes: Cervical, supraclavicular, and axillary nodes normal. Neurologic: Alert and oriented X 3, normal strength and tone. Normal symmetric reflexes. Normal coordination and gait Psych- no depression, no anxiety      Assessment:    Healthy female exam.       Plan:     ghm utd Check labs See After Visit Summary for Counseling Recommendations    1. Encounter for screening mammogram for breast cancer   - MM Digital Screening; Future  2. Preventative health care   - Basic metabolic panel - CBC with Differential/Platelet - Hepatic function panel - Lipid panel - POCT urinalysis dipstick - TSH  3. Screening for malignant neoplasm of cervix   - Cytology - PAP

## 2014-10-07 NOTE — Progress Notes (Signed)
Pre visit review using our clinic review tool, if applicable. No additional management support is needed unless otherwise documented below in the visit note. 

## 2014-10-08 LAB — CYTOLOGY - PAP

## 2014-10-17 ENCOUNTER — Ambulatory Visit (HOSPITAL_BASED_OUTPATIENT_CLINIC_OR_DEPARTMENT_OTHER)
Admission: RE | Admit: 2014-10-17 | Discharge: 2014-10-17 | Disposition: A | Discharge status: Home / Self Care | Payer: BLUE CROSS/BLUE SHIELD | Source: Ambulatory Visit | Attending: Family Medicine | Admitting: Family Medicine

## 2014-10-17 DIAGNOSIS — Z1231 Encounter for screening mammogram for malignant neoplasm of breast: Secondary | ICD-10-CM | POA: Diagnosis not present

## 2015-10-09 ENCOUNTER — Encounter: Payer: Self-pay | Admitting: Family Medicine

## 2015-10-09 ENCOUNTER — Other Ambulatory Visit: Payer: Self-pay | Admitting: Family Medicine

## 2015-10-09 ENCOUNTER — Ambulatory Visit (INDEPENDENT_AMBULATORY_CARE_PROVIDER_SITE_OTHER): Payer: BLUE CROSS/BLUE SHIELD | Admitting: Family Medicine

## 2015-10-09 VITALS — BP 137/84 | HR 63 | Temp 98.2°F | Resp 17 | Ht 65.0 in | Wt 208.2 lb

## 2015-10-09 DIAGNOSIS — Z23 Encounter for immunization: Secondary | ICD-10-CM | POA: Diagnosis not present

## 2015-10-09 DIAGNOSIS — R030 Elevated blood-pressure reading, without diagnosis of hypertension: Secondary | ICD-10-CM

## 2015-10-09 DIAGNOSIS — IMO0001 Reserved for inherently not codable concepts without codable children: Secondary | ICD-10-CM

## 2015-10-09 DIAGNOSIS — Z0000 Encounter for general adult medical examination without abnormal findings: Secondary | ICD-10-CM | POA: Diagnosis not present

## 2015-10-09 DIAGNOSIS — R600 Localized edema: Secondary | ICD-10-CM

## 2015-10-09 DIAGNOSIS — Z1231 Encounter for screening mammogram for malignant neoplasm of breast: Secondary | ICD-10-CM

## 2015-10-09 LAB — CBC WITH DIFFERENTIAL/PLATELET
Basophils Absolute: 0 10*3/uL (ref 0.0–0.1)
Basophils Relative: 0.5 % (ref 0.0–3.0)
Eosinophils Absolute: 0.1 10*3/uL (ref 0.0–0.7)
Eosinophils Relative: 1.8 % (ref 0.0–5.0)
HCT: 40.1 % (ref 36.0–46.0)
Hemoglobin: 13.8 g/dL (ref 12.0–15.0)
LYMPHS ABS: 1.8 10*3/uL (ref 0.7–4.0)
Lymphocytes Relative: 33.5 % (ref 12.0–46.0)
MCHC: 34.5 g/dL (ref 30.0–36.0)
MCV: 88.4 fl (ref 78.0–100.0)
MONOS PCT: 7.3 % (ref 3.0–12.0)
Monocytes Absolute: 0.4 10*3/uL (ref 0.1–1.0)
Neutro Abs: 3 10*3/uL (ref 1.4–7.7)
Neutrophils Relative %: 56.9 % (ref 43.0–77.0)
Platelets: 237 10*3/uL (ref 150.0–400.0)
RBC: 4.54 Mil/uL (ref 3.87–5.11)
RDW: 13.6 % (ref 11.5–15.5)
WBC: 5.3 10*3/uL (ref 4.0–10.5)

## 2015-10-09 LAB — POCT URINALYSIS DIPSTICK
Bilirubin, UA: NEGATIVE
Glucose, UA: NEGATIVE
KETONES UA: NEGATIVE
LEUKOCYTES UA: NEGATIVE
NITRITE UA: NEGATIVE
PH UA: 6
PROTEIN UA: NEGATIVE
RBC UA: NEGATIVE
Spec Grav, UA: 1.02
Urobilinogen, UA: NEGATIVE

## 2015-10-09 LAB — LIPID PANEL
Cholesterol: 267 mg/dL — ABNORMAL HIGH (ref 0–200)
HDL: 44.8 mg/dL (ref >39.00)
LDL Cholesterol: 189 mg/dL — ABNORMAL HIGH (ref 0–99)
NONHDL: 222.23
TRIGLYCERIDES: 164 mg/dL — AB (ref 0.0–149.0)
Total CHOL/HDL Ratio: 6
VLDL: 32.8 mg/dL (ref 0.0–40.0)

## 2015-10-09 LAB — COMPREHENSIVE METABOLIC PANEL
ALBUMIN: 4.3 g/dL (ref 3.5–5.2)
ALT: 20 U/L (ref 0–35)
AST: 21 U/L (ref 0–37)
Alkaline Phosphatase: 116 U/L (ref 39–117)
BILIRUBIN TOTAL: 0.8 mg/dL (ref 0.2–1.2)
BUN: 21 mg/dL (ref 6–23)
CALCIUM: 9.4 mg/dL (ref 8.4–10.5)
CO2: 29 meq/L (ref 19–32)
Chloride: 102 mEq/L (ref 96–112)
Creatinine, Ser: 0.92 mg/dL (ref 0.40–1.20)
GFR: 66.85 mL/min (ref >60.00)
Glucose, Bld: 95 mg/dL (ref 70–99)
Potassium: 4 mEq/L (ref 3.5–5.1)
SODIUM: 136 meq/L (ref 135–145)
Total Protein: 7.2 g/dL (ref 6.0–8.3)

## 2015-10-09 LAB — TSH: TSH: 1.78 u[IU]/mL (ref 0.35–4.50)

## 2015-10-09 MED ORDER — HYDROCHLOROTHIAZIDE 25 MG PO TABS: 25.0000 mg | ORAL_TABLET | Freq: Every day | ORAL | 11 refills | Status: DC

## 2015-10-09 NOTE — Progress Notes (Signed)
Subjective:     Tami Monroe is a 55 y.o. female and is here for a comprehensive physical exam. The patient reports no problems.  Social History   Social History  . Marital status: Single    Spouse name: N/A  . Number of children: N/A  . Years of education: N/A   Occupational History  . 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 Smoking status: Never Smoker  . Smokeless tobacco: Never Used  . Alcohol use Yes     Comment: OCC  . Drug use: No  . Sexual activity: Not Currently    Partners: Male   Other Topics Concern  . Not on file   Social History Narrative   Exercise---  no   Health Maintenance  Topic Date Due  . Hepatitis C Screening  1960/06/04  . HIV Screening  08/28/1975  . TETANUS/TDAP  04/08/2015  . INFLUENZA VACCINE  10/13/2015  . MAMMOGRAM  10/16/2016  . PAP SMEAR  10/06/2017  . COLONOSCOPY  03/14/2018    The following portions of the patient's history were reviewed and updated as appropriate: She  has a past medical history of Allergy; Hyperlipidemia; and Hypertension. She  does not have any pertinent problems on file. She  has a past surgical history that includes Cholecystectomy (2011). Her family history includes Alcohol abuse in her father; Arthritis in her mother; Bladder Cancer (age of onset: 72) in her mother; Crohn's disease (age of onset: 55) in her brother; Diabetes in her mother; Diabetes (age of onset: 3) in her sister; Diabetes Mellitus II (age of onset: 32) in her brother; Heart disease in her mother; Hyperlipidemia in her mother; Hypertension in her mother; Lung cancer in her father. She  reports that she has never smoked. She has never used smokeless tobacco. She reports that she drinks alcohol. She reports that she does not use drugs. She has a current medication list which includes the following prescription(s): hydrochlorothiazide. No current outpatient prescriptions on file prior to visit.   No current  facility-administered medications on file prior to visit.    She has No Known Allergies..  Review of Systems Review of Systems  Constitutional: Negative for activity change, appetite change and fatigue.  HENT: Negative for hearing loss, congestion, tinnitus and ear discharge.  dentist q77m Eyes: Negative for visual disturbance (see optho q1y -- vision corrected to 20/20 with glasses).  Respiratory: Negative for cough, chest tightness and shortness of breath.   Cardiovascular: Negative for chest pain, palpitations and leg swelling.  Gastrointestinal: Negative for abdominal pain, diarrhea, constipation and abdominal distention.  Genitourinary: Negative for urgency, frequency, decreased urine volume and difficulty urinating.  Musculoskeletal: Negative for back pain, arthralgias and gait problem.  Skin: Negative for color change, pallor and rash.  Neurological: Negative for dizziness, light-headedness, numbness and headaches.  Hematological: Negative for adenopathy. Does not bruise/bleed easily.  Psychiatric/Behavioral: Negative for suicidal ideas, confusion, sleep disturbance, self-injury, dysphoric mood, decreased concentration and agitation.      Objective:    BP 137/84 (BP Location: Left Arm, Patient Position: Sitting)   Pulse 63   Temp 98.2 F (36.8 C)   Resp 17   Ht 5\' 5"  (1.651 m)   Wt 208 lb 3.2 oz (94.4 kg)   SpO2 97%   BMI 34.65 kg/m  General appearance: alert, cooperative, appears stated age and no distress Head: Normocephalic, without obvious abnormality, atraumatic Eyes: conjunctivae/corneas clear. PERRL, EOM's intact. Fundi benign. Ears: normal TM's  and external ear canals both ears Nose: Nares normal. Septum midline. Mucosa normal. No drainage or sinus tenderness. Throat: lips, mucosa, and tongue normal; teeth and gums normal Neck: no adenopathy, no carotid bruit, no JVD, supple, symmetrical, trachea midline and thyroid not enlarged, symmetric, no  tenderness/mass/nodules Back: symmetric, no curvature. ROM normal. No CVA tenderness. Lungs: clear to auscultation bilaterally Breasts: normal appearance, no masses or tenderness Heart: regular rate and rhythm, S1, S2 normal, no murmur, click, rub or gallop Abdomen: soft, non-tender; bowel sounds normal; no masses,  no organomegaly Pelvic: deferred Extremities: extremities normal, atraumatic, no cyanosis or edema Pulses: 2+ and symmetric Skin: Skin color, texture, turgor normal. No rashes or lesions Lymph nodes: Cervical, supraclavicular, and axillary nodes normal. Neurologic: Alert and oriented X 3, normal strength and tone. Normal symmetric reflexes. Normal coordination and gait    Assessment:    Healthy female exam.      Plan:.    ghm utd-- tdap given today Check labs See After Visit Summary for Counseling Recommendations    1. Preventative health care See above - Comprehensive metabolic panel - Lipid panel - TSH - POCT urinalysis dipstick - CBC with Differential/Platelet - Hepatitis C antibody  2. Elevated BP Dash diet Encouraged diet and exercise - hydrochlorothiazide (HYDRODIURIL) 25 MG tablet; Take 1 tablet (25 mg total) by mouth daily. 1 po qd prn  Dispense: 30 tablet; Refill: 11  3. Bilateral edema of lower extremity Elevate legs rto 2-3 weesks - hydrochlorothiazide (HYDRODIURIL) 25 MG tablet; Take 1 tablet (25 mg total) by mouth daily. 1 po qd prn  Dispense: 30 tablet; Refill: 11

## 2015-10-09 NOTE — Assessment & Plan Note (Signed)
hctz Watch salt rto 2-3 weeks

## 2015-10-09 NOTE — Progress Notes (Signed)
Pre visit review using our clinic review tool, if applicable. No additional management support is needed unless otherwise documented below in the visit note. 

## 2015-10-09 NOTE — Patient Instructions (Signed)
Preventive Care for Adults, Female A healthy lifestyle and preventive care can promote health and wellness. Preventive health guidelines for women include the following key practices.  A routine yearly physical is a good way to check with your health care provider about your health and preventive screening. It is a chance to share any concerns and updates on your health and to receive a thorough exam.  Visit your dentist for a routine exam and preventive care every 6 months. Brush your teeth twice a day and floss once a day. Good oral hygiene prevents tooth decay and gum disease.  The frequency of eye exams is based on your age, health, family medical history, use of contact lenses, and other factors. Follow your health care provider's recommendations for frequency of eye exams.  Eat a healthy diet. Foods like vegetables, fruits, whole grains, low-fat dairy products, and lean protein foods contain the nutrients you need without too many calories. Decrease your intake of foods high in solid fats, added sugars, and salt. Eat the right amount of calories for you.Get information about a proper diet from your health care provider, if necessary.  Regular physical exercise is one of the most important things you can do for your health. Most adults should get at least 150 minutes of moderate-intensity exercise (any activity that increases your heart rate and causes you to sweat) each week. In addition, most adults need muscle-strengthening exercises on 2 or more days a week.  Maintain a healthy weight. The body mass index (BMI) is a screening tool to identify possible weight problems. It provides an estimate of body fat based on height and weight. Your health care provider can find your BMI and can help you achieve or maintain a healthy weight.For adults 20 years and older:  A BMI below 18.5 is considered underweight.  A BMI of 18.5 to 24.9 is normal.  A BMI of 25 to 29.9 is considered overweight.  A  BMI of 30 and above is considered obese.  Maintain normal blood lipids and cholesterol levels by exercising and minimizing your intake of saturated fat. Eat a balanced diet with plenty of fruit and vegetables. Blood tests for lipids and cholesterol should begin at age 45 and be repeated every 5 years. If your lipid or cholesterol levels are high, you are over 50, or you are at high risk for heart disease, you may need your cholesterol levels checked more frequently.Ongoing high lipid and cholesterol levels should be treated with medicines if diet and exercise are not working.  If you smoke, find out from your health care provider how to quit. If you do not use tobacco, do not start.  Lung cancer screening is recommended for adults aged 45-80 years who are at high risk for developing lung cancer because of a history of smoking. A yearly low-dose CT scan of the lungs is recommended for people who have at least a 30-pack-year history of smoking and are a current smoker or have quit within the past 15 years. A pack year of smoking is smoking an average of 1 pack of cigarettes a day for 1 year (for example: 1 pack a day for 30 years or 2 packs a day for 15 years). Yearly screening should continue until the smoker has stopped smoking for at least 15 years. Yearly screening should be stopped for people who develop a health problem that would prevent them from having lung cancer treatment.  If you are pregnant, do not drink alcohol. If you are  breastfeeding, be very cautious about drinking alcohol. If you are not pregnant and choose to drink alcohol, do not have more than 1 drink per day. One drink is considered to be 12 ounces (355 mL) of beer, 5 ounces (148 mL) of wine, or 1.5 ounces (44 mL) of liquor.  Avoid use of street drugs. Do not share needles with anyone. Ask for help if you need support or instructions about stopping the use of drugs.  High blood pressure causes heart disease and increases the risk  of stroke. Your blood pressure should be checked at least every 1 to 2 years. Ongoing high blood pressure should be treated with medicines if weight loss and exercise do not work.  If you are 55-79 years old, ask your health care provider if you should take aspirin to prevent strokes.  Diabetes screening is done by taking a blood sample to check your blood glucose level after you have not eaten for a certain period of time (fasting). If you are not overweight and you do not have risk factors for diabetes, you should be screened once every 3 years starting at age 45. If you are overweight or obese and you are 40-70 years of age, you should be screened for diabetes every year as part of your cardiovascular risk assessment.  Breast cancer screening is essential preventive care for women. You should practice "breast self-awareness." This means understanding the normal appearance and feel of your breasts and may include breast self-examination. Any changes detected, no matter how small, should be reported to a health care provider. Women in their 20s and 30s should have a clinical breast exam (CBE) by a health care provider as part of a regular health exam every 1 to 3 years. After age 40, women should have a CBE every year. Starting at age 40, women should consider having a mammogram (breast X-ray test) every year. Women who have a family history of breast cancer should talk to their health care provider about genetic screening. Women at a high risk of breast cancer should talk to their health care providers about having an MRI and a mammogram every year.  Breast cancer gene (BRCA)-related cancer risk assessment is recommended for women who have family members with BRCA-related cancers. BRCA-related cancers include breast, ovarian, tubal, and peritoneal cancers. Having family members with these cancers may be associated with an increased risk for harmful changes (mutations) in the breast cancer genes BRCA1 and  BRCA2. Results of the assessment will determine the need for genetic counseling and BRCA1 and BRCA2 testing.  Your health care provider may recommend that you be screened regularly for cancer of the pelvic organs (ovaries, uterus, and vagina). This screening involves a pelvic examination, including checking for microscopic changes to the surface of your cervix (Pap test). You may be encouraged to have this screening done every 3 years, beginning at age 21.  For women ages 30-65, health care providers may recommend pelvic exams and Pap testing every 3 years, or they may recommend the Pap and pelvic exam, combined with testing for human papilloma virus (HPV), every 5 years. Some types of HPV increase your risk of cervical cancer. Testing for HPV may also be done on women of any age with unclear Pap test results.  Other health care providers may not recommend any screening for nonpregnant women who are considered low risk for pelvic cancer and who do not have symptoms. Ask your health care provider if a screening pelvic exam is right for   you.  If you have had past treatment for cervical cancer or a condition that could lead to cancer, you need Pap tests and screening for cancer for at least 20 years after your treatment. If Pap tests have been discontinued, your risk factors (such as having a new sexual partner) need to be reassessed to determine if screening should resume. Some women have medical problems that increase the chance of getting cervical cancer. In these cases, your health care provider may recommend more frequent screening and Pap tests.  Colorectal cancer can be detected and often prevented. Most routine colorectal cancer screening begins at the age of 50 years and continues through age 75 years. However, your health care provider may recommend screening at an earlier age if you have risk factors for colon cancer. On a yearly basis, your health care provider may provide home test kits to check  for hidden blood in the stool. Use of a small camera at the end of a tube, to directly examine the colon (sigmoidoscopy or colonoscopy), can detect the earliest forms of colorectal cancer. Talk to your health care provider about this at age 50, when routine screening begins. Direct exam of the colon should be repeated every 5-10 years through age 75 years, unless early forms of precancerous polyps or small growths are found.  People who are at an increased risk for hepatitis B should be screened for this virus. You are considered at high risk for hepatitis B if:  You were born in a country where hepatitis B occurs often. Talk with your health care provider about which countries are considered high risk.  Your parents were born in a high-risk country and you have not received a shot to protect against hepatitis B (hepatitis B vaccine).  You have HIV or AIDS.  You use needles to inject street drugs.  You live with, or have sex with, someone who has hepatitis B.  You get hemodialysis treatment.  You take certain medicines for conditions like cancer, organ transplantation, and autoimmune conditions.  Hepatitis C blood testing is recommended for all people born from 1945 through 1965 and any individual with known risks for hepatitis C.  Practice safe sex. Use condoms and avoid high-risk sexual practices to reduce the spread of sexually transmitted infections (STIs). STIs include gonorrhea, chlamydia, syphilis, trichomonas, herpes, HPV, and human immunodeficiency virus (HIV). Herpes, HIV, and HPV are viral illnesses that have no cure. They can result in disability, cancer, and death.  You should be screened for sexually transmitted illnesses (STIs) including gonorrhea and chlamydia if:  You are sexually active and are younger than 24 years.  You are older than 24 years and your health care provider tells you that you are at risk for this type of infection.  Your sexual activity has changed  since you were last screened and you are at an increased risk for chlamydia or gonorrhea. Ask your health care provider if you are at risk.  If you are at risk of being infected with HIV, it is recommended that you take a prescription medicine daily to prevent HIV infection. This is called preexposure prophylaxis (PrEP). You are considered at risk if:  You are sexually active and do not regularly use condoms or know the HIV status of your partner(s).  You take drugs by injection.  You are sexually active with a partner who has HIV.  Talk with your health care provider about whether you are at high risk of being infected with HIV. If   you choose to begin PrEP, you should first be tested for HIV. You should then be tested every 3 months for as long as you are taking PrEP.  Osteoporosis is a disease in which the bones lose minerals and strength with aging. This can result in serious bone fractures or breaks. The risk of osteoporosis can be identified using a bone density scan. Women ages 67 years and over and women at risk for fractures or osteoporosis should discuss screening with their health care providers. Ask your health care provider whether you should take a calcium supplement or vitamin D to reduce the rate of osteoporosis.  Menopause can be associated with physical symptoms and risks. Hormone replacement therapy is available to decrease symptoms and risks. You should talk to your health care provider about whether hormone replacement therapy is right for you.  Use sunscreen. Apply sunscreen liberally and repeatedly throughout the day. You should seek shade when your shadow is shorter than you. Protect yourself by wearing long sleeves, pants, a wide-brimmed hat, and sunglasses year round, whenever you are outdoors.  Once a month, do a whole body skin exam, using a mirror to look at the skin on your back. Tell your health care provider of new moles, moles that have irregular borders, moles that  are larger than a pencil eraser, or moles that have changed in shape or color.  Stay current with required vaccines (immunizations).  Influenza vaccine. All adults should be immunized every year.  Tetanus, diphtheria, and acellular pertussis (Td, Tdap) vaccine. Pregnant women should receive 1 dose of Tdap vaccine during each pregnancy. The dose should be obtained regardless of the length of time since the last dose. Immunization is preferred during the 27th-36th week of gestation. An adult who has not previously received Tdap or who does not know her vaccine status should receive 1 dose of Tdap. This initial dose should be followed by tetanus and diphtheria toxoids (Td) booster doses every 10 years. Adults with an unknown or incomplete history of completing a 3-dose immunization series with Td-containing vaccines should begin or complete a primary immunization series including a Tdap dose. Adults should receive a Td booster every 10 years.  Varicella vaccine. An adult without evidence of immunity to varicella should receive 2 doses or a second dose if she has previously received 1 dose. Pregnant females who do not have evidence of immunity should receive the first dose after pregnancy. This first dose should be obtained before leaving the health care facility. The second dose should be obtained 4-8 weeks after the first dose.  Human papillomavirus (HPV) vaccine. Females aged 13-26 years who have not received the vaccine previously should obtain the 3-dose series. The vaccine is not recommended for use in pregnant females. However, pregnancy testing is not needed before receiving a dose. If a female is found to be pregnant after receiving a dose, no treatment is needed. In that case, the remaining doses should be delayed until after the pregnancy. Immunization is recommended for any person with an immunocompromised condition through the age of 61 years if she did not get any or all doses earlier. During the  3-dose series, the second dose should be obtained 4-8 weeks after the first dose. The third dose should be obtained 24 weeks after the first dose and 16 weeks after the second dose.  Zoster vaccine. One dose is recommended for adults aged 30 years or older unless certain conditions are present.  Measles, mumps, and rubella (MMR) vaccine. Adults born  before 1957 generally are considered immune to measles and mumps. Adults born in 1957 or later should have 1 or more doses of MMR vaccine unless there is a contraindication to the vaccine or there is laboratory evidence of immunity to each of the three diseases. A routine second dose of MMR vaccine should be obtained at least 28 days after the first dose for students attending postsecondary schools, health care workers, or international travelers. People who received inactivated measles vaccine or an unknown type of measles vaccine during 1963-1967 should receive 2 doses of MMR vaccine. People who received inactivated mumps vaccine or an unknown type of mumps vaccine before 1979 and are at high risk for mumps infection should consider immunization with 2 doses of MMR vaccine. For females of childbearing age, rubella immunity should be determined. If there is no evidence of immunity, females who are not pregnant should be vaccinated. If there is no evidence of immunity, females who are pregnant should delay immunization until after pregnancy. Unvaccinated health care workers born before 1957 who lack laboratory evidence of measles, mumps, or rubella immunity or laboratory confirmation of disease should consider measles and mumps immunization with 2 doses of MMR vaccine or rubella immunization with 1 dose of MMR vaccine.  Pneumococcal 13-valent conjugate (PCV13) vaccine. When indicated, a person who is uncertain of his immunization history and has no record of immunization should receive the PCV13 vaccine. All adults 65 years of age and older should receive this  vaccine. An adult aged 19 years or older who has certain medical conditions and has not been previously immunized should receive 1 dose of PCV13 vaccine. This PCV13 should be followed with a dose of pneumococcal polysaccharide (PPSV23) vaccine. Adults who are at high risk for pneumococcal disease should obtain the PPSV23 vaccine at least 8 weeks after the dose of PCV13 vaccine. Adults older than 57 years of age who have normal immune system function should obtain the PPSV23 vaccine dose at least 1 year after the dose of PCV13 vaccine.  Pneumococcal polysaccharide (PPSV23) vaccine. When PCV13 is also indicated, PCV13 should be obtained first. All adults aged 65 years and older should be immunized. An adult younger than age 65 years who has certain medical conditions should be immunized. Any person who resides in a nursing home or long-term care facility should be immunized. An adult smoker should be immunized. People with an immunocompromised condition and certain other conditions should receive both PCV13 and PPSV23 vaccines. People with human immunodeficiency virus (HIV) infection should be immunized as soon as possible after diagnosis. Immunization during chemotherapy or radiation therapy should be avoided. Routine use of PPSV23 vaccine is not recommended for American Indians, [place] Natives, or people younger than 65 years unless there are medical conditions that require PPSV23 vaccine. When indicated, people who have unknown immunization and have no record of immunization should receive PPSV23 vaccine. One-time revaccination 5 years after the first dose of PPSV23 is recommended for people aged 19-64 years who have chronic kidney failure, nephrotic syndrome, asplenia, or immunocompromised conditions. People who received 1-2 doses of PPSV23 before age 65 years should receive another dose of PPSV23 vaccine at age 65 years or later if at least 5 years have passed since the previous dose. Doses of PPSV23 are not  needed for people immunized with PPSV23 at or after age 65 years.  Meningococcal vaccine. Adults with asplenia or persistent complement component deficiencies should receive 2 doses of quadrivalent meningococcal conjugate (MenACWY-D) vaccine. The doses should be obtained   at least 2 months apart. Microbiologists working with certain meningococcal bacteria, [place] recruits, people at risk during an outbreak, and people who travel to or live in countries with a high rate of meningitis should be immunized. A first-year college student up through age 34 years who is living in a residence hall should receive a dose if she did not receive a dose on or after her 16th birthday. Adults who have certain high-risk conditions should receive one or more doses of vaccine.  Hepatitis A vaccine. Adults who wish to be protected from this disease, have certain high-risk conditions, work with hepatitis A-infected animals, work in hepatitis A research labs, or travel to or work in countries with a high rate of hepatitis A should be immunized. Adults who were previously unvaccinated and who anticipate close contact with an international adoptee during the first 60 days after arrival in the [place] from a country with a high rate of hepatitis A should be immunized.  Hepatitis B vaccine. Adults who wish to be protected from this disease, have certain high-risk conditions, may be exposed to blood or other infectious body fluids, are household contacts or sex partners of hepatitis B positive people, are clients or workers in certain care facilities, or travel to or work in countries with a high rate of hepatitis B should be immunized.  Haemophilus influenzae type b (Hib) vaccine. A previously unvaccinated person with asplenia or sickle cell disease or having a scheduled splenectomy should receive 1 dose of Hib vaccine. Regardless of previous immunization, a recipient of a hematopoietic stem cell transplant should receive a  3-dose series 6-12 months after her successful transplant. Hib vaccine is not recommended for adults with HIV infection. Preventive Services / Frequency Ages 35 to 4 years  Blood pressure check.** / Every 3-5 years.  Lipid and cholesterol check.** / Every 5 years beginning at age 60.  Clinical breast exam.** / Every 3 years for women in their 71s and 10s.  BRCA-related cancer risk assessment.** / For women who have family members with a BRCA-related cancer (breast, ovarian, tubal, or peritoneal cancers).  Pap test.** / Every 2 years from ages 76 through 26. Every 3 years starting at age 61 through age 76 or 93 with a history of 3 consecutive normal Pap tests.  HPV screening.** / Every 3 years from ages 37 through ages 60 to 51 with a history of 3 consecutive normal Pap tests.  Hepatitis C blood test.** / For any individual with known risks for hepatitis C.  Skin self-exam. / Monthly.  Influenza vaccine. / Every year.  Tetanus, diphtheria, and acellular pertussis (Tdap, Td) vaccine.** / Consult your health care provider. Pregnant women should receive 1 dose of Tdap vaccine during each pregnancy. 1 dose of Td every 10 years.  Varicella vaccine.** / Consult your health care provider. Pregnant females who do not have evidence of immunity should receive the first dose after pregnancy.  HPV vaccine. / 3 doses over 6 months, if 93 and younger. The vaccine is not recommended for use in pregnant females. However, pregnancy testing is not needed before receiving a dose.  Measles, mumps, rubella (MMR) vaccine.** / You need at least 1 dose of MMR if you were born in 1957 or later. You may also need a 2nd dose. For females of childbearing age, rubella immunity should be determined. If there is no evidence of immunity, females who are not pregnant should be vaccinated. If there is no evidence of immunity, females who are  pregnant should delay immunization until after pregnancy.  Pneumococcal  13-valent conjugate (PCV13) vaccine.** / Consult your health care provider.  Pneumococcal polysaccharide (PPSV23) vaccine.** / 1 to 2 doses if you smoke cigarettes or if you have certain conditions.  Meningococcal vaccine.** / 1 dose if you are age 68 to 8 years and a Market researcher living in a residence hall, or have one of several medical conditions, you need to get vaccinated against meningococcal disease. You may also need additional booster doses.  Hepatitis A vaccine.** / Consult your health care provider.  Hepatitis B vaccine.** / Consult your health care provider.  Haemophilus influenzae type b (Hib) vaccine.** / Consult your health care provider. Ages 7 to 53 years  Blood pressure check.** / Every year.  Lipid and cholesterol check.** / Every 5 years beginning at age 25 years.  Lung cancer screening. / Every year if you are aged 11-80 years and have a 30-pack-year history of smoking and currently smoke or have quit within the past 15 years. Yearly screening is stopped once you have quit smoking for at least 15 years or develop a health problem that would prevent you from having lung cancer treatment.  Clinical breast exam.** / Every year after age 48 years.  BRCA-related cancer risk assessment.** / For women who have family members with a BRCA-related cancer (breast, ovarian, tubal, or peritoneal cancers).  Mammogram.** / Every year beginning at age 41 years and continuing for as long as you are in good health. Consult with your health care provider.  Pap test.** / Every 3 years starting at age 65 years through age 37 or 70 years with a history of 3 consecutive normal Pap tests.  HPV screening.** / Every 3 years from ages 72 years through ages 60 to 40 years with a history of 3 consecutive normal Pap tests.  Fecal occult blood test (FOBT) of stool. / Every year beginning at age 21 years and continuing until age 5 years. You may not need to do this test if you get  a colonoscopy every 10 years.  Flexible sigmoidoscopy or colonoscopy.** / Every 5 years for a flexible sigmoidoscopy or every 10 years for a colonoscopy beginning at age 35 years and continuing until age 48 years.  Hepatitis C blood test.** / For all people born from [birth year] through 1965 and any individual with known risks for hepatitis C.  Skin self-exam. / Monthly.  Influenza vaccine. / Every year.  Tetanus, diphtheria, and acellular pertussis (Tdap/Td) vaccine.** / Consult your health care provider. Pregnant women should receive 1 dose of Tdap vaccine during each pregnancy. 1 dose of Td every 10 years.  Varicella vaccine.** / Consult your health care provider. Pregnant females who do not have evidence of immunity should receive the first dose after pregnancy.  Zoster vaccine.** / 1 dose for adults aged 30 years or older.  Measles, mumps, rubella (MMR) vaccine.** / You need at least 1 dose of MMR if you were born in 1957 or later. You may also need a second dose. For females of childbearing age, rubella immunity should be determined. If there is no evidence of immunity, females who are not pregnant should be vaccinated. If there is no evidence of immunity, females who are pregnant should delay immunization until after pregnancy.  Pneumococcal 13-valent conjugate (PCV13) vaccine.** / Consult your health care provider.  Pneumococcal polysaccharide (PPSV23) vaccine.** / 1 to 2 doses if you smoke cigarettes or if you have certain conditions.  Meningococcal vaccine.** /  Consult your health care provider.  Hepatitis A vaccine.** / Consult your health care provider.  Hepatitis B vaccine.** / Consult your health care provider.  Haemophilus influenzae type b (Hib) vaccine.** / Consult your health care provider. Ages 89 years and over  Blood pressure check.** / Every year.  Lipid and cholesterol check.** / Every 5 years beginning at age 56 years.  Lung cancer screening. / Every year if you  are aged 47-80 years and have a 30-pack-year history of smoking and currently smoke or have quit within the past 15 years. Yearly screening is stopped once you have quit smoking for at least 15 years or develop a health problem that would prevent you from having lung cancer treatment.  Clinical breast exam.** / Every year after age 46 years.  BRCA-related cancer risk assessment.** / For women who have family members with a BRCA-related cancer (breast, ovarian, tubal, or peritoneal cancers).  Mammogram.** / Every year beginning at age 31 years and continuing for as long as you are in good health. Consult with your health care provider.  Pap test.** / Every 3 years starting at age 51 years through age 54 or 81 years with 3 consecutive normal Pap tests. Testing can be stopped between 65 and 70 years with 3 consecutive normal Pap tests and no abnormal Pap or HPV tests in the past 10 years.  HPV screening.** / Every 3 years from ages 57 years through ages 9 or 3 years with a history of 3 consecutive normal Pap tests. Testing can be stopped between 65 and 70 years with 3 consecutive normal Pap tests and no abnormal Pap or HPV tests in the past 10 years.  Fecal occult blood test (FOBT) of stool. / Every year beginning at age 68 years and continuing until age 2 years. You may not need to do this test if you get a colonoscopy every 10 years.  Flexible sigmoidoscopy or colonoscopy.** / Every 5 years for a flexible sigmoidoscopy or every 10 years for a colonoscopy beginning at age 80 years and continuing until age 56 years.  Hepatitis C blood test.** / For all people born from [birth year] through 1967 and any individual with known risks for hepatitis C.  Osteoporosis screening.** / A one-time screening for women ages 71 years and over and women at risk for fractures or osteoporosis.  Skin self-exam. / Monthly.  Influenza vaccine. / Every year.  Tetanus, diphtheria, and acellular pertussis (Tdap/Td)  vaccine.** / 1 dose of Td every 10 years.  Varicella vaccine.** / Consult your health care provider.  Zoster vaccine.** / 1 dose for adults aged 45 years or older.  Pneumococcal 13-valent conjugate (PCV13) vaccine.** / Consult your health care provider.  Pneumococcal polysaccharide (PPSV23) vaccine.** / 1 dose for all adults aged 94 years and older.  Meningococcal vaccine.** / Consult your health care provider.  Hepatitis A vaccine.** / Consult your health care provider.  Hepatitis B vaccine.** / Consult your health care provider.  Haemophilus influenzae type b (Hib) vaccine.** / Consult your health care provider. ** Family history and personal history of risk and conditions may change your health care provider's recommendations.   This information is not intended to replace advice given to you by your health care provider. Make sure you discuss any questions you have with your health care provider.   Document Released: 04/26/2001 Document Revised: 03/21/2014 Document Reviewed: 07/26/2010 Elsevier Interactive Patient Education 2016 [place] DASH stands for "Dietary Approaches to Stop Hypertension."  The DASH eating plan is a healthy eating plan that has been shown to reduce high blood pressure (hypertension). Additional health benefits may include reducing the risk of type 2 diabetes mellitus, heart disease, and stroke. The DASH eating plan may also help with weight loss. WHAT DO I NEED TO KNOW ABOUT THE DASH EATING PLAN? For the DASH eating plan, you will follow these general guidelines:  Choose foods with a percent daily value for sodium of less than 5% (as listed on the food label).  Use salt-free seasonings or herbs instead of table salt or sea salt.  Check with your health care provider or pharmacist before using salt substitutes.  Eat lower-sodium products, often labeled as "lower sodium" or "no salt added."  Eat fresh foods.  Eat more vegetables,  fruits, and low-fat dairy products.  Choose whole grains. Look for the word "whole" as the first word in the ingredient list.  Choose fish and skinless chicken or [place] more often than red meat. Limit fish, poultry, and meat to 6 oz (170 g) each day.  Limit sweets, desserts, sugars, and sugary drinks.  Choose heart-healthy fats.  Limit cheese to 1 oz (28 g) per day.  Eat more home-cooked food and less restaurant, buffet, and fast food.  Limit fried foods.  Cook foods using methods other than frying.  Limit canned vegetables. If you do use them, rinse them well to decrease the sodium.  When eating at a restaurant, ask that your food be prepared with less salt, or no salt if possible. WHAT FOODS CAN I EAT? Seek help from a dietitian for individual calorie needs. Grains Whole grain or whole wheat bread. Brown rice. Whole grain or whole wheat pasta. Quinoa, bulgur, and whole grain cereals. Low-sodium cereals. Corn or whole wheat flour tortillas. Whole grain cornbread. Whole grain crackers. Low-sodium crackers. Vegetables Fresh or frozen vegetables (raw, steamed, roasted, or grilled). Low-sodium or reduced-sodium tomato and vegetable juices. Low-sodium or reduced-sodium tomato sauce and paste. Low-sodium or reduced-sodium canned vegetables.  Fruits All fresh, canned (in natural juice), or frozen fruits. Meat and Other Protein Products Ground beef (85% or leaner), grass-fed beef, or beef trimmed of fat. Skinless chicken or [place]. Ground chicken or [place]. Pork trimmed of fat. All fish and seafood. Eggs. Dried beans, peas, or lentils. Unsalted nuts and seeds. Unsalted canned beans. Dairy Low-fat dairy products, such as skim or 1% milk, 2% or reduced-fat cheeses, low-fat ricotta or cottage cheese, or plain low-fat yogurt. Low-sodium or reduced-sodium cheeses. Fats and Oils Tub margarines without trans fats. Light or reduced-fat mayonnaise and salad dressings (reduced sodium). Avocado.  Safflower, olive, or canola oils. Natural peanut or almond butter. Other Unsalted popcorn and pretzels. The items listed above may not be a complete list of recommended foods or beverages. Contact your dietitian for more options. WHAT FOODS ARE NOT RECOMMENDED? Grains White bread. White pasta. White rice. Refined cornbread. Bagels and croissants. Crackers that contain trans fat. Vegetables Creamed or fried vegetables. Vegetables in a cheese sauce. Regular canned vegetables. Regular canned tomato sauce and paste. Regular tomato and vegetable juices. Fruits Dried fruits. Canned fruit in light or heavy syrup. Fruit juice. Meat and Other Protein Products Fatty cuts of meat. Ribs, chicken wings, bacon, sausage, bologna, salami, chitterlings, fatback, hot dogs, bratwurst, and packaged luncheon meats. Salted nuts and seeds. Canned beans with salt. Dairy Whole or 2% milk, cream, half-and-half, and cream cheese. Whole-fat or sweetened yogurt. Full-fat cheeses or blue cheese. Nondairy creamers and whipped toppings. Processed  cheese spreads, or cheese curds. Condiments Onion and garlic salt, seasoned salt, table salt, and sea salt. Canned and packaged gravies. Worcestershire sauce. Tartar sauce. Barbecue sauce. Teriyaki sauce. Soy sauce, including reduced sodium. Steak sauce. Fish sauce. Oyster sauce. Cocktail sauce. Horseradish. Ketchup and mustard. Meat flavorings and tenderizers. Bouillon cubes. Hot sauce. Tabasco sauce. Marinades. Taco seasonings. Relishes. Fats and Oils Butter, stick margarine, lard, shortening, ghee, and bacon fat. Coconut, palm kernel, or palm oils. Regular salad dressings. Other Pickles and olives. Salted popcorn and pretzels. The items listed above may not be a complete list of foods and beverages to avoid. Contact your dietitian for more information. WHERE CAN I FIND MORE INFORMATION? National Heart, Lung, and Blood Institute:  www.nhlbi.nih.gov/health/health-topics/topics/dash/   This information is not intended to replace advice given to you by your health care provider. Make sure you discuss any questions you have with your health care provider.   Document Released: 02/17/2011 Document Revised: 03/21/2014 Document Reviewed: 01/02/2013 Elsevier Interactive Patient Education 2016 Elsevier Inc[employer]  

## 2015-10-10 LAB — HEPATITIS C ANTIBODY: HCV AB: NEGATIVE

## 2015-10-20 ENCOUNTER — Ambulatory Visit (HOSPITAL_BASED_OUTPATIENT_CLINIC_OR_DEPARTMENT_OTHER)
Admission: RE | Admit: 2015-10-20 | Discharge: 2015-10-20 | Disposition: A | Discharge status: Home / Self Care | Payer: BLUE CROSS/BLUE SHIELD | Source: Ambulatory Visit | Attending: Family Medicine | Admitting: Family Medicine

## 2015-10-20 DIAGNOSIS — Z1231 Encounter for screening mammogram for malignant neoplasm of breast: Secondary | ICD-10-CM | POA: Diagnosis present

## 2015-10-27 ENCOUNTER — Encounter: Payer: Self-pay | Admitting: Family Medicine

## 2015-10-27 ENCOUNTER — Ambulatory Visit (INDEPENDENT_AMBULATORY_CARE_PROVIDER_SITE_OTHER): Payer: BLUE CROSS/BLUE SHIELD | Admitting: Family Medicine

## 2015-10-27 VITALS — BP 110/78 | HR 70 | Temp 98.7°F | Wt 199.6 lb

## 2015-10-27 DIAGNOSIS — R21 Rash and other nonspecific skin eruption: Secondary | ICD-10-CM | POA: Diagnosis not present

## 2015-10-27 DIAGNOSIS — R030 Elevated blood-pressure reading, without diagnosis of hypertension: Secondary | ICD-10-CM

## 2015-10-27 DIAGNOSIS — IMO0001 Reserved for inherently not codable concepts without codable children: Secondary | ICD-10-CM

## 2015-10-27 MED ORDER — LEVOCETIRIZINE DIHYDROCHLORIDE 5 MG PO TABS: 5.0000 mg | ORAL_TABLET | Freq: Every evening | ORAL | 5 refills | Status: DC

## 2015-10-27 MED ORDER — LIRAGLUTIDE -WEIGHT MANAGEMENT 18 MG/3ML ~~LOC~~ SOPN: 3.0000 mg | PEN_INJECTOR | Freq: Every day | SUBCUTANEOUS | 2 refills | Status: DC

## 2015-10-27 NOTE — Progress Notes (Signed)
Pre visit review using our clinic review tool, if applicable. No additional management support is needed unless otherwise documented below in the visit note. 

## 2015-10-27 NOTE — Assessment & Plan Note (Signed)
con't hctz Slightly elevated Not as high at home

## 2015-10-27 NOTE — Progress Notes (Signed)
Patient ID: Tami Monroe, female    DOB: 1961/02/05  Age: 55 y.o. MRN: [id number]    Subjective:  Subjective  HPI Lilyani Labra presents for f/u bp-- she joined weight watchers and is doing well  She now c/o red spots on legs from thurs-sun and it is slowly getting better  She is still taking the hctz Review of Systems  Constitutional: Negative for activity change, appetite change, fatigue and unexpected weight change.  Respiratory: Negative for cough and shortness of breath.   Cardiovascular: Negative for chest pain and palpitations.  Skin: Positive for rash.  Psychiatric/Behavioral: Negative for behavioral problems and dysphoric mood. The patient is not nervous/anxious.     History Past Medical History:  Diagnosis Date  . Allergy   . Hyperlipidemia   . Hypertension     She has a past surgical history that includes Cholecystectomy (2011).   Her family history includes Alcohol abuse in her father; Arthritis in her mother; Bladder Cancer (age of onset: 87) in her mother; Crohn's disease (age of onset: 55) in her brother; Diabetes in her mother; Diabetes (age of onset: 68) in her sister; Diabetes Mellitus II (age of onset: 37) in her brother; Heart disease in her mother; Hyperlipidemia in her mother; Hypertension in her mother; Lung cancer in her father.She reports that she has never smoked. She has never used smokeless tobacco. She reports that she drinks alcohol. She reports that she does not use drugs.  Current Outpatient Prescriptions on File Prior to Visit  Medication Sig Dispense Refill  . hydrochlorothiazide (HYDRODIURIL) 25 MG tablet Take 1 tablet (25 mg total) by mouth daily. 1 po qd prn 30 tablet 11   No current facility-administered medications on file prior to visit.      Objective:  Objective  Physical Exam  Constitutional: She is oriented to person, place, and time. She appears well-developed and well-nourished.  HENT:  Head: Normocephalic and atraumatic.  Eyes:  Conjunctivae and EOM are normal.  Neck: Normal range of motion. Neck supple. No JVD present. Carotid bruit is not present. No thyromegaly present.  Cardiovascular: Normal rate, regular rhythm and normal heart sounds.   No murmur heard. Pulmonary/Chest: Effort normal and breath sounds normal. No respiratory distress. She has no wheezes. She has no rales. She exhibits no tenderness.  Musculoskeletal: She exhibits no edema.  Neurological: She is alert and oriented to person, place, and time.  Skin: Skin is warm and dry. Rash noted.  Psychiatric: She has a normal mood and affect. Her behavior is normal. Judgment and thought content normal.  Nursing note and vitals reviewed.  BP 110/78   Pulse 70   Temp 98.7 F (37.1 C) (Oral)   Wt 199 lb 9.6 oz (90.5 kg)   SpO2 98%   BMI 33.22 kg/m  Wt Readings from Last 3 Encounters:  10/27/15 199 lb 9.6 oz (90.5 kg)  10/09/15 208 lb 3.2 oz (94.4 kg)  10/07/14 197 lb 6.4 oz (89.5 kg)     Lab Results  Component Value Date   WBC 5.3 10/09/2015   HGB 13.8 10/09/2015   HCT 40.1 10/09/2015   PLT 237.0 10/09/2015   GLUCOSE 95 10/09/2015   CHOL 267 (H) 10/09/2015   TRIG 164.0 (H) 10/09/2015   HDL 44.80 10/09/2015   LDLDIRECT 168.8 06/19/2012   LDLCALC 189 (H) 10/09/2015   ALT 20 10/09/2015   AST 21 10/09/2015   NA 136 10/09/2015   K 4.0 10/09/2015   CL 102 10/09/2015   CREATININE  0.92 10/09/2015   BUN 21 10/09/2015   CO2 29 10/09/2015   TSH 1.78 10/09/2015   MICROALBUR 0.2 12/15/2011    Mm Screening Breast Tomo Bilateral  Result Date: 10/21/2015 CLINICAL DATA:  Screening. EXAM: 2D DIGITAL SCREENING BILATERAL MAMMOGRAM WITH CAD AND ADJUNCT TOMO COMPARISON:  Previous exam(s). ACR Breast Density Category a: The breast tissue is almost entirely fatty. FINDINGS: There are no findings suspicious for malignancy. Images were processed with CAD. IMPRESSION: No mammographic evidence of malignancy. A result letter of this screening mammogram will be  mailed directly to the patient. RECOMMENDATION: Screening mammogram in one year. (Code:SM-B-01Y) BI-RADS CATEGORY  1: Negative. Electronically Signed   By: Franki Cabot M.D.   On: 10/21/2015 09:49     Assessment & Plan:  Plan  I am having Ms. Mazzeo start on levocetirizine and Liraglutide -Weight Management. I am also having her maintain her hydrochlorothiazide.  Meds ordered this encounter  Medications  . levocetirizine (XYZAL) 5 MG tablet    Sig: Take 1 tablet (5 mg total) by mouth every evening.    Dispense:  30 tablet    Refill:  5  . Liraglutide -Weight Management (SAXENDA) 18 MG/3ML SOPN    Sig: Inject 3 mg into the skin daily.    Dispense:  3 mL    Refill:  2    Problem List Items Addressed This Visit      Unprioritized   Elevated BP    con't hctz Slightly elevated Not as high at home         Other Visit Diagnoses    Rash and nonspecific skin eruption    -  Primary   Relevant Medications   levocetirizine (XYZAL) 5 MG tablet   Morbid obesity due to excess calories (HCC)       Relevant Medications   Liraglutide -Weight Management (SAXENDA) 18 MG/3ML SOPN      Follow-up: Return in about 3 months (around 01/27/2016) for weight loss.  Ann Held, DO

## 2015-10-27 NOTE — Patient Instructions (Signed)

## 2015-11-30 ENCOUNTER — Telehealth: Payer: Self-pay

## 2015-11-30 NOTE — Telephone Encounter (Signed)
Pt returned call. She would like a call back after 1p if possible.   CB: [phone]

## 2016-01-28 ENCOUNTER — Ambulatory Visit (INDEPENDENT_AMBULATORY_CARE_PROVIDER_SITE_OTHER): Payer: BLUE CROSS/BLUE SHIELD | Admitting: Family Medicine

## 2016-01-28 ENCOUNTER — Encounter: Payer: Self-pay | Admitting: Family Medicine

## 2016-01-28 VITALS — BP 104/62 | HR 71 | Temp 98.1°F | Resp 16 | Ht 64.0 in | Wt 192.6 lb

## 2016-01-28 DIAGNOSIS — I10 Essential (primary) hypertension: Secondary | ICD-10-CM | POA: Diagnosis not present

## 2016-01-28 DIAGNOSIS — R030 Elevated blood-pressure reading, without diagnosis of hypertension: Secondary | ICD-10-CM

## 2016-01-28 DIAGNOSIS — R600 Localized edema: Secondary | ICD-10-CM | POA: Diagnosis not present

## 2016-01-28 MED ORDER — LIRAGLUTIDE -WEIGHT MANAGEMENT 18 MG/3ML ~~LOC~~ SOPN: 3.0000 mg | PEN_INJECTOR | Freq: Every day | SUBCUTANEOUS | 2 refills | Status: DC

## 2016-01-28 MED ORDER — INSULIN PEN NEEDLE 32G X 6 MM MISC: 3 refills | Status: DC

## 2016-01-28 NOTE — Progress Notes (Signed)
Patient ID: Tami Monroe, female    DOB: 1960/06/07  Age: 55 y.o. MRN: [id number]    Subjective:  Subjective  HPI Tami Monroe presents for f/u weight and bp.  No complaints.   Review of Systems  Constitutional: Negative for appetite change, diaphoresis, fatigue and unexpected weight change.  Eyes: Negative for pain, redness and visual disturbance.  Respiratory: Negative for cough, chest tightness, shortness of breath and wheezing.   Cardiovascular: Negative for chest pain, palpitations and leg swelling.  Endocrine: Negative for cold intolerance, heat intolerance, polydipsia, polyphagia and polyuria.  Genitourinary: Negative for difficulty urinating, dysuria and frequency.  Neurological: Negative for dizziness, light-headedness, numbness and headaches.    History Past Medical History:  Diagnosis Date  . Allergy   . Hyperlipidemia   . Hypertension     She has a past surgical history that includes Cholecystectomy (2011).   Her family history includes Alcohol abuse in her father; Arthritis in her mother; Bladder Cancer (age of onset: 31) in her mother; Crohn's disease (age of onset: 13) in her brother; Diabetes in her mother; Diabetes (age of onset: 56) in her sister; Diabetes Mellitus II (age of onset: 63) in her brother; Heart disease in her mother; Hyperlipidemia in her mother; Hypertension in her mother; Lung cancer in her father.She reports that she has never smoked. She has never used smokeless tobacco. She reports that she drinks alcohol. She reports that she does not use drugs.  Current Outpatient Prescriptions on File Prior to Visit  Medication Sig Dispense Refill  . levocetirizine (XYZAL) 5 MG tablet Take 1 tablet (5 mg total) by mouth every evening. (Patient not taking: Reported on 01/28/2016) 30 tablet 5   No current facility-administered medications on file prior to visit.      Objective:  Objective  Physical Exam  Constitutional: She is oriented to person, place, and  time. She appears well-developed and well-nourished.  HENT:  Head: Normocephalic and atraumatic.  Eyes: Conjunctivae and EOM are normal.  Neck: Normal range of motion. Neck supple. No JVD present. Carotid bruit is not present. No thyromegaly present.  Cardiovascular: Normal rate, regular rhythm and normal heart sounds.   No murmur heard. Pulmonary/Chest: Effort normal and breath sounds normal. No respiratory distress. She has no wheezes. She has no rales. She exhibits no tenderness.  Musculoskeletal: She exhibits no edema.  Neurological: She is alert and oriented to person, place, and time.  Psychiatric: She has a normal mood and affect.  Nursing note and vitals reviewed.  BP 104/62 (BP Location: Left Arm, Patient Position: Sitting, Cuff Size: Normal)   Pulse 71   Temp 98.1 F (36.7 C) (Oral)   Resp 16   Ht 5\' 4"  (1.626 m)   Wt 192 lb 9.6 oz (87.4 kg)   SpO2 97%   BMI 33.06 kg/m  Wt Readings from Last 3 Encounters:  01/28/16 192 lb 9.6 oz (87.4 kg)  10/27/15 199 lb 9.6 oz (90.5 kg)  10/09/15 208 lb 3.2 oz (94.4 kg)     Lab Results  Component Value Date   WBC 5.3 10/09/2015   HGB 13.8 10/09/2015   HCT 40.1 10/09/2015   PLT 237.0 10/09/2015   GLUCOSE 95 10/09/2015   CHOL 267 (H) 10/09/2015   TRIG 164.0 (H) 10/09/2015   HDL 44.80 10/09/2015   LDLDIRECT 168.8 06/19/2012   LDLCALC 189 (H) 10/09/2015   ALT 20 10/09/2015   AST 21 10/09/2015   NA 136 10/09/2015   K 4.0 10/09/2015   CL  102 10/09/2015   CREATININE 0.92 10/09/2015   BUN 21 10/09/2015   CO2 29 10/09/2015   TSH 1.78 10/09/2015   MICROALBUR 0.2 12/15/2011    Mm Screening Breast Tomo Bilateral  Result Date: 10/21/2015 CLINICAL DATA:  Screening. EXAM: 2D DIGITAL SCREENING BILATERAL MAMMOGRAM WITH CAD AND ADJUNCT TOMO COMPARISON:  Previous exam(s). ACR Breast Density Category a: The breast tissue is almost entirely fatty. FINDINGS: There are no findings suspicious for malignancy. Images were processed with CAD.  IMPRESSION: No mammographic evidence of malignancy. A result letter of this screening mammogram will be mailed directly to the patient. RECOMMENDATION: Screening mammogram in one year. (Code:SM-B-01Y) BI-RADS CATEGORY  1: Negative. Electronically Signed   By: Franki Cabot M.D.   On: 10/21/2015 09:49     Assessment & Plan:  Plan  I am having Ms. Marcou start on Insulin Pen Needle. I am also having her maintain her levocetirizine, Liraglutide -Weight Management, and hydrochlorothiazide.  Meds ordered this encounter  Medications  . DISCONTD: Liraglutide -Weight Management (SAXENDA) 18 MG/3ML SOPN    Sig: Inject 3 mg into the skin daily.    Dispense:  3 mL    Refill:  2  . Insulin Pen Needle 32G X 6 MM MISC    Sig: Use daily    Dispense:  100 each    Refill:  3  . Liraglutide -Weight Management (SAXENDA) 18 MG/3ML SOPN    Sig: Inject 3 mg into the skin daily.    Dispense:  3 mL    Refill:  2  . hydrochlorothiazide (HYDRODIURIL) 25 MG tablet    Sig: Take 1 tablet (25 mg total) by mouth daily. 1 po qd prn    Dispense:  30 tablet    Refill:  11    Problem List Items Addressed This Visit      Unprioritized   Elevated BP without diagnosis of hypertension    D/w diet and exercise rto 3 months or sooner prn      Lower extremity edema    con't hctz Elevated legs prn      Morbid obesity due to excess calories (HCC)    saxenda D/w pt diet and exercise Pt instructed on how to take saxenda-- will slowly work up to 3 mg daily rto 3 months or sooner prn      Relevant Medications   Insulin Pen Needle 32G X 6 MM MISC   Liraglutide -Weight Management (SAXENDA) 18 MG/3ML SOPN    Other Visit Diagnoses    Essential hypertension    -  Primary   Relevant Medications   hydrochlorothiazide (HYDRODIURIL) 25 MG tablet   Bilateral edema of lower extremity       Relevant Medications   hydrochlorothiazide (HYDRODIURIL) 25 MG tablet      Follow-up: Return in about 3 months (around  04/29/2016), or weight check.  Ann Held, DO

## 2016-01-28 NOTE — Progress Notes (Signed)
Pre visit review using our clinic review tool, if applicable. No additional management support is needed unless otherwise documented below in the visit note. 

## 2016-01-28 NOTE — Patient Instructions (Signed)
Hypertension Hypertension, commonly called high blood pressure, is when the force of blood pumping through your arteries is too strong. Your arteries are the blood vessels that carry blood from your heart throughout your body. A blood pressure reading consists of a higher number over a lower number, such as 110/72. The higher number (systolic) is the pressure inside your arteries when your heart pumps. The lower number (diastolic) is the pressure inside your arteries when your heart relaxes. Ideally you want your blood pressure below 120/80. Hypertension forces your heart to work harder to pump blood. Your arteries may become narrow or stiff. Having untreated or uncontrolled hypertension can cause heart attack, stroke, kidney disease, and other problems. What increases the risk? Some risk factors for high blood pressure are controllable. Others are not. Risk factors you cannot control include:  Race. You may be at higher risk if you are African American.  Age. Risk increases with age.  Gender. Men are at higher risk than women before age 45 years. After age 65, women are at higher risk than men. Risk factors you can control include:  Not getting enough exercise or physical activity.  Being overweight.  Getting too much fat, sugar, calories, or salt in your diet.  Drinking too much alcohol. What are the signs or symptoms? Hypertension does not usually cause signs or symptoms. Extremely high blood pressure (hypertensive crisis) may cause headache, anxiety, shortness of breath, and nosebleed. How is this diagnosed? To check if you have hypertension, your health care provider will measure your blood pressure while you are seated, with your arm held at the level of your heart. It should be measured at least twice using the same arm. Certain conditions can cause a difference in blood pressure between your right and left arms. A blood pressure reading that is higher than normal on one occasion does  not mean that you need treatment. If it is not clear whether you have high blood pressure, you may be asked to return on a different day to have your blood pressure checked again. Or, you may be asked to monitor your blood pressure at home for 1 or more weeks. How is this treated? Treating high blood pressure includes making lifestyle changes and possibly taking medicine. Living a healthy lifestyle can help lower high blood pressure. You may need to change some of your habits. Lifestyle changes may include:  Following the DASH diet. This diet is high in fruits, vegetables, and whole grains. It is low in salt, red meat, and added sugars.  Keep your sodium intake below 2,300 mg per day.  Getting at least 30-45 minutes of aerobic exercise at least 4 times per week.  Losing weight if necessary.  Not smoking.  Limiting alcoholic beverages.  Learning ways to reduce stress. Your health care provider may prescribe medicine if lifestyle changes are not enough to get your blood pressure under control, and if one of the following is true:  You are 18-59 years of age and your systolic blood pressure is above 140.  You are 60 years of age or older, and your systolic blood pressure is above 150.  Your diastolic blood pressure is above 90.  You have diabetes, and your systolic blood pressure is over 140 or your diastolic blood pressure is over 90.  You have kidney disease and your blood pressure is above 140/90.  You have heart disease and your blood pressure is above 140/90. Your personal target blood pressure may vary depending on your medical   conditions, your age, and other factors. Follow these instructions at home:  Have your blood pressure rechecked as directed by your health care provider.  Take medicines only as directed by your health care provider. Follow the directions carefully. Blood pressure medicines must be taken as prescribed. The medicine does not work as well when you skip  doses. Skipping doses also puts you at risk for problems.  Do not smoke.  Monitor your blood pressure at home as directed by your health care provider. Contact a health care provider if:  You think you are having a reaction to medicines taken.  You have recurrent headaches or feel dizzy.  You have swelling in your ankles.  You have trouble with your vision. Get help right away if:  You develop a severe headache or confusion.  You have unusual weakness, numbness, or feel faint.  You have severe chest or abdominal pain.  You vomit repeatedly.  You have trouble breathing. This information is not intended to replace advice given to you by your health care provider. Make sure you discuss any questions you have with your health care provider. Document Released: 02/28/2005 Document Revised: 08/06/2015 Document Reviewed: 12/21/2012 Elsevier Interactive Patient Education  2017 Elsevier Inc.  

## 2016-01-31 DIAGNOSIS — Z6831 Body mass index (BMI) 31.0-31.9, adult: Secondary | ICD-10-CM

## 2016-01-31 DIAGNOSIS — R600 Localized edema: Secondary | ICD-10-CM | POA: Insufficient documentation

## 2016-01-31 DIAGNOSIS — E6609 Other obesity due to excess calories: Secondary | ICD-10-CM | POA: Insufficient documentation

## 2016-01-31 HISTORY — DX: Other obesity due to excess calories: E66.09

## 2016-01-31 HISTORY — DX: Localized edema: R60.0

## 2016-01-31 MED ORDER — HYDROCHLOROTHIAZIDE 25 MG PO TABS: 25.0000 mg | ORAL_TABLET | Freq: Every day | ORAL | 11 refills | Status: DC

## 2016-01-31 NOTE — Assessment & Plan Note (Signed)
con't hctz Elevated legs prn

## 2016-01-31 NOTE — Assessment & Plan Note (Signed)
D/w diet and exercise rto 3 months or sooner prn

## 2016-01-31 NOTE — Assessment & Plan Note (Signed)
saxenda D/w pt diet and exercise Pt instructed on how to take saxenda-- will slowly work up to 3 mg daily rto 3 months or sooner prn

## 2016-05-05 ENCOUNTER — Ambulatory Visit (INDEPENDENT_AMBULATORY_CARE_PROVIDER_SITE_OTHER): Payer: BLUE CROSS/BLUE SHIELD | Admitting: Family Medicine

## 2016-05-05 ENCOUNTER — Encounter: Payer: Self-pay | Admitting: Family Medicine

## 2016-05-05 DIAGNOSIS — E785 Hyperlipidemia, unspecified: Secondary | ICD-10-CM

## 2016-05-05 DIAGNOSIS — R030 Elevated blood-pressure reading, without diagnosis of hypertension: Secondary | ICD-10-CM

## 2016-05-05 DIAGNOSIS — R600 Localized edema: Secondary | ICD-10-CM | POA: Diagnosis not present

## 2016-05-05 NOTE — Progress Notes (Signed)
Pre visit review using our clinic review tool, if applicable. No additional management support is needed unless otherwise documented below in the visit note. 

## 2016-05-05 NOTE — Patient Instructions (Signed)

## 2016-05-05 NOTE — Progress Notes (Signed)
Subjective:    Patient ID: Tami Monroe, female    DOB: 20-Oct-1960, 56 y.o.   MRN: [id number]   I acted as a Education administrator for Dr. Royden Purl, LPN   Chief Complaint  Patient presents with  . Hypertension    follow up  . Weight Check    follow up    Hypertension  This is a chronic problem. The problem is controlled. Pertinent negatives include no blurred vision, chest pain, headaches or palpitations.    Patient is in today for follow up hypertension and weigh check.  Past Medical History:  Diagnosis Date  . Allergy   . Hyperlipidemia   . Hypertension     Past Surgical History:  Procedure Laterality Date  . CHOLECYSTECTOMY  2011    Family History  Problem Relation Age of Onset  . Arthritis Mother   . Hyperlipidemia Mother   . Heart disease Mother   . Hypertension Mother   . Diabetes Mother   . Bladder Cancer Mother 35  . Lung cancer Father     smoker  . Alcohol abuse Father   . Cancer      [place] cancer  . Diabetes Sister 68  . Diabetes Mellitus II Brother 49  . Crohn's disease Brother 15    Social History   Social History  . Marital status: Single    Spouse name: N/A  . Number of children: N/A  . Years of education: N/A   Occupational History  . 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 Smoking status: Never Smoker  . Smokeless tobacco: Never Used  . Alcohol use Yes     Comment: OCC  . Drug use: No  . Sexual activity: Not Currently    Partners: Male   Other Topics Concern  . Not on file   Social History Narrative   -Exercise---  no       Outpatient Medications Prior to Visit  Medication Sig Dispense Refill  . hydrochlorothiazide (HYDRODIURIL) 25 MG tablet Take 1 tablet (25 mg total) by mouth daily. 1 po qd prn 30 tablet 11  . Insulin Pen Needle 32G X 6 MM MISC Use daily 100 each 3  . levocetirizine (XYZAL) 5 MG tablet Take 1 tablet (5 mg total) by mouth every evening. 30 tablet 5  . Liraglutide -Weight  Management (SAXENDA) 18 MG/3ML SOPN Inject 3 mg into the skin daily. 3 mL 2   No facility-administered medications prior to visit.     No Known Allergies  Review of Systems  Constitutional: Negative for fever.  HENT: Negative for congestion.   Eyes: Negative for blurred vision.  Respiratory: Negative for cough.   Cardiovascular: Negative for chest pain and palpitations.  Gastrointestinal: Negative for vomiting.  Musculoskeletal: Negative for back pain.  Skin: Negative for rash.  Neurological: Negative for loss of consciousness and headaches.       Objective:    Physical Exam  Constitutional: She is oriented to person, place, and time. She appears well-developed and well-nourished. No distress.  HENT:  Head: Normocephalic and atraumatic.  Eyes: Conjunctivae are normal. Pupils are equal, round, and reactive to light.  Neck: Normal range of motion. No thyromegaly present.  Cardiovascular: Normal rate and regular rhythm.   Pulmonary/Chest: Effort normal and breath sounds normal. She has no wheezes.  Abdominal: Soft. Bowel sounds are normal. There is no tenderness.  Musculoskeletal: Normal range of motion. She exhibits no edema or deformity.  Neurological:  She is alert and oriented to person, place, and time.  Skin: Skin is warm and dry. She is not diaphoretic.  Psychiatric: She has a normal mood and affect.    BP 102/70 (BP Location: Left Arm, Patient Position: Sitting, Cuff Size: Normal)   Pulse 76   Temp 98 F (36.7 C) (Oral)   Resp 16   Ht 5\' 4"  (1.626 m)   Wt 195 lb 9.6 oz (88.7 kg)   SpO2 96%   BMI 33.57 kg/m  Wt Readings from Last 3 Encounters:  05/05/16 195 lb 9.6 oz (88.7 kg)  01/28/16 192 lb 9.6 oz (87.4 kg)  10/27/15 199 lb 9.6 oz (90.5 kg)     Lab Results  Component Value Date   WBC 5.3 10/09/2015   HGB 13.8 10/09/2015   HCT 40.1 10/09/2015   PLT 237.0 10/09/2015   GLUCOSE 94 05/05/2016   CHOL 236 (H) 05/05/2016   TRIG 297.0 (H) 05/05/2016   HDL  39.90 05/05/2016   LDLDIRECT 155.0 05/05/2016   LDLCALC 189 (H) 10/09/2015   ALT 21 05/05/2016   AST 23 05/05/2016   NA 139 05/05/2016   K 4.5 05/05/2016   CL 100 05/05/2016   CREATININE 1.26 (H) 05/05/2016   BUN 21 05/05/2016   CO2 33 (H) 05/05/2016   TSH 1.78 10/09/2015   MICROALBUR 0.2 12/15/2011    Lab Results  Component Value Date   TSH 1.78 10/09/2015   Lab Results  Component Value Date   WBC 5.3 10/09/2015   HGB 13.8 10/09/2015   HCT 40.1 10/09/2015   MCV 88.4 10/09/2015   PLT 237.0 10/09/2015   Lab Results  Component Value Date   NA 139 05/05/2016   K 4.5 05/05/2016   CO2 33 (H) 05/05/2016   GLUCOSE 94 05/05/2016   BUN 21 05/05/2016   CREATININE 1.26 (H) 05/05/2016   BILITOT 0.5 05/05/2016   ALKPHOS 100 05/05/2016   AST 23 05/05/2016   ALT 21 05/05/2016   PROT 7.1 05/05/2016   ALBUMIN 4.2 05/05/2016   CALCIUM 9.7 05/05/2016   GFR 46.41 (L) 05/05/2016   Lab Results  Component Value Date   CHOL 236 (H) 05/05/2016   Lab Results  Component Value Date   HDL 39.90 05/05/2016   Lab Results  Component Value Date   LDLCALC 189 (H) 10/09/2015   Lab Results  Component Value Date   TRIG 297.0 (H) 05/05/2016   Lab Results  Component Value Date   CHOLHDL 6 05/05/2016   No results found for: HGBA1C     Assessment & Plan:   Problem List Items Addressed This Visit      Unprioritized   Elevated BP without diagnosis of hypertension   Relevant Orders   Comprehensive metabolic panel (Completed)   Morbid obesity due to excess calories ([place]) - Primary    Other Visit Diagnoses    Hyperlipidemia, unspecified hyperlipidemia type       Relevant Orders   Lipid panel (Completed)   Bilateral edema of lower extremity          I am having Ms. Monroe maintain her levocetirizine, Insulin Pen Needle, Liraglutide -Weight Management, and hydrochlorothiazide.  No orders of the defined types were placed in this encounter.   CMA served as Education administrator during this  visit. History, Physical and Plan performed by medical provider. Documentation and orders reviewed and attested to.  Ann Held, DO Patient ID: Tami Monroe, female   DOB: 1960/05/03, 56 y.o.  MRN: [id number]

## 2016-05-06 LAB — LIPID PANEL
Cholesterol: 236 mg/dL — ABNORMAL HIGH (ref 0–200)
HDL: 39.9 mg/dL (ref >39.00)
NONHDL: 196.27
TRIGLYCERIDES: 297 mg/dL — AB (ref 0.0–149.0)
Total CHOL/HDL Ratio: 6
VLDL: 59.4 mg/dL — ABNORMAL HIGH (ref 0.0–40.0)

## 2016-05-06 LAB — COMPREHENSIVE METABOLIC PANEL
ALK PHOS: 100 U/L (ref 39–117)
ALT: 21 U/L (ref 0–35)
AST: 23 U/L (ref 0–37)
Albumin: 4.2 g/dL (ref 3.5–5.2)
BILIRUBIN TOTAL: 0.5 mg/dL (ref 0.2–1.2)
BUN: 21 mg/dL (ref 6–23)
CALCIUM: 9.7 mg/dL (ref 8.4–10.5)
CO2: 33 mEq/L — ABNORMAL HIGH (ref 19–32)
CREATININE: 1.26 mg/dL — AB (ref 0.40–1.20)
Chloride: 100 mEq/L (ref 96–112)
GFR: 46.41 mL/min — AB (ref >60.00)
GLUCOSE: 94 mg/dL (ref 70–99)
Potassium: 4.5 mEq/L (ref 3.5–5.1)
Sodium: 139 mEq/L (ref 135–145)
TOTAL PROTEIN: 7.1 g/dL (ref 6.0–8.3)

## 2016-05-06 LAB — LDL CHOLESTEROL, DIRECT: LDL DIRECT: 155 mg/dL

## 2016-05-08 MED ORDER — LIRAGLUTIDE -WEIGHT MANAGEMENT 18 MG/3ML ~~LOC~~ SOPN: 3.0000 mg | PEN_INJECTOR | Freq: Every day | SUBCUTANEOUS | 2 refills | Status: DC

## 2016-05-08 MED ORDER — HYDROCHLOROTHIAZIDE 25 MG PO TABS: 25.0000 mg | ORAL_TABLET | Freq: Every day | ORAL | 11 refills | Status: DC

## 2016-10-01 ENCOUNTER — Other Ambulatory Visit: Payer: Self-pay | Admitting: Family Medicine

## 2016-10-01 DIAGNOSIS — R600 Localized edema: Secondary | ICD-10-CM

## 2016-10-10 ENCOUNTER — Telehealth: Payer: Self-pay | Admitting: Family Medicine

## 2016-10-10 DIAGNOSIS — L259 Unspecified contact dermatitis, unspecified cause: Secondary | ICD-10-CM | POA: Diagnosis not present

## 2016-10-10 NOTE — Telephone Encounter (Signed)
[facility] - Client TELEPHONE ADVICE RECORD [facility]  Patient Name: Tami Monroe  DOB: [birth year]    Initial Comment Caller states that she has a rash that is oozing; she thinks she has poison sumac   Nurse Assessment      Guidelines    Guideline Title Affirmed Question Affirmed Notes       Final Disposition User   FINAL ATTEMPT MADE - no message left [place], Therapist, sports, [employer]

## 2016-10-11 ENCOUNTER — Encounter: Payer: Self-pay | Admitting: Family Medicine

## 2016-10-11 ENCOUNTER — Ambulatory Visit (INDEPENDENT_AMBULATORY_CARE_PROVIDER_SITE_OTHER): Payer: BLUE CROSS/BLUE SHIELD | Admitting: Family Medicine

## 2016-10-11 VITALS — BP 120/80 | HR 80 | Temp 98.0°F | Ht 65.0 in | Wt 197.2 lb

## 2016-10-11 DIAGNOSIS — L237 Allergic contact dermatitis due to plants, except food: Secondary | ICD-10-CM | POA: Diagnosis not present

## 2016-10-11 DIAGNOSIS — R600 Localized edema: Secondary | ICD-10-CM | POA: Diagnosis not present

## 2016-10-11 DIAGNOSIS — L03113 Cellulitis of right upper limb: Secondary | ICD-10-CM

## 2016-10-11 MED ORDER — HYDROCHLOROTHIAZIDE 25 MG PO TABS: 25.0000 mg | ORAL_TABLET | Freq: Every day | ORAL | 3 refills | Status: DC

## 2016-10-11 MED ORDER — METHYLPREDNISOLONE ACETATE 80 MG/ML IJ SUSP
80.0000 mg | Freq: Once | INTRAMUSCULAR | Status: AC
  Administered 2016-10-11: 80 mg via INTRAMUSCULAR

## 2016-10-11 MED ORDER — CEPHALEXIN 500 MG PO CAPS: 500.0000 mg | ORAL_CAPSULE | Freq: Two times a day (BID) | ORAL | 0 refills | Status: DC

## 2016-10-11 NOTE — Patient Instructions (Signed)
Poison Ivy Dermatitis Poison ivy dermatitis is redness and soreness (inflammation) of the skin. It is caused by a chemical that is found on the leaves of the poison ivy plant. You may also have itching, a rash, and blisters. Symptoms often clear up in 1-2 weeks. You may get this condition by touching a poison ivy plant. You can also get it by touching something that has the chemical on it. This may include animals or objects that have come in contact with the plant. Follow these instructions at home: General instructions  Take or apply over-the-counter and prescription medicines only as told by your doctor.  If you touch poison ivy, wash your skin with soap and cold water right away.  Use hydrocortisone creams or calamine lotion as needed to help with itching.  Take oatmeal baths as needed. Use colloidal oatmeal. You can get this at a pharmacy or grocery store. Follow the instructions on the package.  Do not scratch or rub your skin.  While you have the rash, wash your clothes right after you wear them. Prevention  Know what poison ivy looks like so you can avoid it. This plant has three leaves with flowering branches on a single stem. The leaves are glossy. They have uneven edges that come to a point at the front.  If you have touched poison ivy, wash with soap and water right away. Be sure to wash under your fingernails.  When hiking or camping, wear long pants, a long-sleeved shirt, tall socks, and hiking boots. You can also use a lotion on your skin that helps to prevent contact with the chemical on the plant.  If you think that your clothes or outdoor gear came in contact with poison ivy, rinse them off with a garden hose before you bring them inside your house. Contact a doctor if:  You have open sores in the rash area.  You have more redness, swelling, or pain in the affected area.  You have redness that spreads beyond the rash area.  You have fluid, blood, or pus coming from  the affected area.  You have a fever.  You have a rash over a large area of your body.  You have a rash on your eyes, mouth, or genitals.  Your rash does not get better after a few days. Get help right away if:  Your face swells or your eyes swell shut.  You have trouble breathing.  You have trouble swallowing. This information is not intended to replace advice given to you by your health care provider. Make sure you discuss any questions you have with your health care provider. Document Released: 04/02/2010 Document Revised: 08/06/2015 Document Reviewed: 08/06/2014 Elsevier Interactive Patient Education  2018 Elsevier Inc.  

## 2016-10-11 NOTE — Progress Notes (Signed)
Patient ID: Tami Monroe, female    DOB: May 01, 1960  Age: 56 y.o. MRN: [id number]    Subjective:  Subjective  HPI Tami Monroe presents for poison sumac -- she had from her back yard.  She states she went to [facility] over weekend and was given pred taper but it is not better   Review of Systems  Constitutional: Negative for fever.  HENT: Negative for congestion.   Respiratory: Negative for shortness of breath.   Cardiovascular: Negative for chest pain, palpitations and leg swelling.  Gastrointestinal: Negative for abdominal pain, blood in stool and nausea.  Genitourinary: Negative for dysuria and frequency.  Skin: Positive for rash.  Allergic/Immunologic: Negative for environmental allergies.  Neurological: Negative for dizziness and headaches.  Psychiatric/Behavioral: The patient is not nervous/anxious.     History Past Medical History:  Diagnosis Date  . Allergy   . Hyperlipidemia   . Hypertension     She has a past surgical history that includes Cholecystectomy (2011).   Her family history includes Alcohol abuse in her father; Arthritis in her mother; Bladder Cancer (age of onset: 57) in her mother; Cancer in her unknown relative; Crohn's disease (age of onset: 32) in her brother; Diabetes in her mother; Diabetes (age of onset: 28) in her sister; Diabetes Mellitus II (age of onset: 71) in her brother; Heart disease in her mother; Hyperlipidemia in her mother; Hypertension in her mother; Lung cancer in her father.She reports that she has never smoked. She has never used smokeless tobacco. She reports that she drinks alcohol. She reports that she does not use drugs.  No current outpatient prescriptions on file prior to visit.   No current facility-administered medications on file prior to visit.      Objective:  Objective  Physical Exam  Constitutional: She is oriented to person, place, and time. She appears well-developed and well-nourished.  HENT:  Head: Normocephalic and  atraumatic.  Eyes: Conjunctivae and EOM are normal.  Neck: Normal range of motion. Neck supple. No JVD present. Carotid bruit is not present. No thyromegaly present.  Cardiovascular: Normal rate, regular rhythm and normal heart sounds.   No murmur heard. Pulmonary/Chest: Effort normal and breath sounds normal. No respiratory distress. She has no wheezes. She has no rales. She exhibits no tenderness.  Musculoskeletal: She exhibits no edema.  Neurological: She is alert and oriented to person, place, and time.  Skin: Rash noted. Rash is vesicular. There is erythema.     Psychiatric: She has a normal mood and affect.  Nursing note and vitals reviewed.    BP 120/80 (BP Location: Left Arm, Patient Position: Sitting, Cuff Size: Normal)   Pulse 80   Temp 98 F (36.7 C) (Oral)   Ht 5\' 5"  (1.651 m)   Wt 197 lb 4 oz (89.5 kg)   SpO2 98%   BMI 32.82 kg/m  Wt Readings from Last 3 Encounters:  10/11/16 197 lb 4 oz (89.5 kg)  05/05/16 195 lb 9.6 oz (88.7 kg)  01/28/16 192 lb 9.6 oz (87.4 kg)     Lab Results  Component Value Date   WBC 5.3 10/09/2015   HGB 13.8 10/09/2015   HCT 40.1 10/09/2015   PLT 237.0 10/09/2015   GLUCOSE 94 05/05/2016   CHOL 236 (H) 05/05/2016   TRIG 297.0 (H) 05/05/2016   HDL 39.90 05/05/2016   LDLDIRECT 155.0 05/05/2016   LDLCALC 189 (H) 10/09/2015   ALT 21 05/05/2016   AST 23 05/05/2016   NA 139 05/05/2016  K 4.5 05/05/2016   CL 100 05/05/2016   CREATININE 1.26 (H) 05/05/2016   BUN 21 05/05/2016   CO2 33 (H) 05/05/2016   TSH 1.78 10/09/2015   MICROALBUR 0.2 12/15/2011    Mm Screening Breast Tomo Bilateral  Result Date: 10/20/2015 CLINICAL DATA:  Screening. EXAM: 2D DIGITAL SCREENING BILATERAL MAMMOGRAM WITH CAD AND ADJUNCT TOMO COMPARISON:  Previous exam(s). ACR Breast Density Category a: The breast tissue is almost entirely fatty. FINDINGS: There are no findings suspicious for malignancy. Images were processed with CAD. IMPRESSION: No mammographic  evidence of malignancy. A result letter of this screening mammogram will be mailed directly to the patient. RECOMMENDATION: Screening mammogram in one year. (Code:SM-B-01Y) BI-RADS CATEGORY  1: Negative. Electronically Signed   By: Franki Cabot M.D.   On: 10/21/2015 09:49     Assessment & Plan:  Plan  I have discontinued [name]'s levocetirizine, Insulin Pen Needle, and Liraglutide -Weight Management. I am also having her start on cephALEXin. Additionally, I am having her maintain her predniSONE, betamethasone dipropionate, and hydrochlorothiazide. We administered methylPREDNISolone acetate.  Meds ordered this encounter  Medications  . predniSONE (DELTASONE) 10 MG tablet    Sig: Take 6 tablets by mouth daily for 2 days decrease to 1 tablet every 2 days until done.  . betamethasone dipropionate (DIPROLENE) 0.05 % ointment    Sig: Apply topically 2 (two) times daily.  . hydrochlorothiazide (HYDRODIURIL) 25 MG tablet    Sig: Take 1 tablet (25 mg total) by mouth daily. 1 po qd prn    Dispense:  90 tablet    Refill:  3  . cephALEXin (KEFLEX) 500 MG capsule    Sig: Take 1 capsule (500 mg total) by mouth 2 (two) times daily.    Dispense:  20 capsule    Refill:  0  . methylPREDNISolone acetate (DEPO-MEDROL) injection 80 mg    Problem List Items Addressed This Visit    None    Visit Diagnoses    Poison sumac    -  Primary   Relevant Medications   cephALEXin (KEFLEX) 500 MG capsule   methylPREDNISolone acetate (DEPO-MEDROL) injection 80 mg (Completed)   Bilateral edema of lower extremity       Relevant Medications   hydrochlorothiazide (HYDRODIURIL) 25 MG tablet   Cellulitis of right arm       Relevant Medications   cephALEXin (KEFLEX) 500 MG capsule   methylPREDNISolone acetate (DEPO-MEDROL) injection 80 mg (Completed)      Follow-up: Return if symptoms worsen or fail to improve.  Ann Held, DO

## 2016-10-11 NOTE — Progress Notes (Signed)
Pre visit review using our clinic review tool, if applicable. No additional management support is needed unless otherwise documented below in the visit note. 

## 2016-11-05 DIAGNOSIS — N3001 Acute cystitis with hematuria: Secondary | ICD-10-CM | POA: Diagnosis not present

## 2016-11-05 DIAGNOSIS — R3989 Other symptoms and signs involving the genitourinary system: Secondary | ICD-10-CM | POA: Diagnosis not present

## 2016-12-29 ENCOUNTER — Other Ambulatory Visit (HOSPITAL_COMMUNITY)
Admission: RE | Admit: 2016-12-29 | Discharge: 2016-12-29 | Disposition: A | Discharge status: Home / Self Care | Payer: BLUE CROSS/BLUE SHIELD | Source: Ambulatory Visit | Attending: Family Medicine | Admitting: Family Medicine

## 2016-12-29 ENCOUNTER — Ambulatory Visit (INDEPENDENT_AMBULATORY_CARE_PROVIDER_SITE_OTHER): Payer: BLUE CROSS/BLUE SHIELD | Admitting: Family Medicine

## 2016-12-29 ENCOUNTER — Encounter: Payer: Self-pay | Admitting: Family Medicine

## 2016-12-29 VITALS — BP 120/80 | HR 77 | Temp 97.8°F | Ht 65.0 in | Wt 199.0 lb

## 2016-12-29 DIAGNOSIS — Z23 Encounter for immunization: Secondary | ICD-10-CM

## 2016-12-29 DIAGNOSIS — Z1231 Encounter for screening mammogram for malignant neoplasm of breast: Secondary | ICD-10-CM | POA: Diagnosis not present

## 2016-12-29 DIAGNOSIS — Z124 Encounter for screening for malignant neoplasm of cervix: Secondary | ICD-10-CM | POA: Insufficient documentation

## 2016-12-29 DIAGNOSIS — R350 Frequency of micturition: Secondary | ICD-10-CM | POA: Diagnosis not present

## 2016-12-29 DIAGNOSIS — E2839 Other primary ovarian failure: Secondary | ICD-10-CM | POA: Diagnosis not present

## 2016-12-29 DIAGNOSIS — Z0000 Encounter for general adult medical examination without abnormal findings: Secondary | ICD-10-CM | POA: Diagnosis not present

## 2016-12-29 DIAGNOSIS — D229 Melanocytic nevi, unspecified: Secondary | ICD-10-CM

## 2016-12-29 DIAGNOSIS — R252 Cramp and spasm: Secondary | ICD-10-CM | POA: Diagnosis not present

## 2016-12-29 DIAGNOSIS — I10 Essential (primary) hypertension: Secondary | ICD-10-CM | POA: Diagnosis not present

## 2016-12-29 DIAGNOSIS — N6320 Unspecified lump in the left breast, unspecified quadrant: Secondary | ICD-10-CM

## 2016-12-29 DIAGNOSIS — Z1239 Encounter for other screening for malignant neoplasm of breast: Secondary | ICD-10-CM

## 2016-12-29 LAB — LIPID PANEL
CHOL/HDL RATIO: 6
CHOLESTEROL: 270 mg/dL — AB (ref 0–200)
HDL: 47.7 mg/dL (ref >39.00)
LDL Cholesterol: 187 mg/dL — ABNORMAL HIGH (ref 0–99)
NonHDL: 222.25
Triglycerides: 174 mg/dL — ABNORMAL HIGH (ref 0.0–149.0)
VLDL: 34.8 mg/dL (ref 0.0–40.0)

## 2016-12-29 LAB — TSH: TSH: 1.68 u[IU]/mL (ref 0.35–4.50)

## 2016-12-29 LAB — POCT URINALYSIS DIPSTICK
Bilirubin, UA: NEGATIVE
Blood, UA: NEGATIVE
GLUCOSE UA: NEGATIVE
Ketones, UA: NEGATIVE
LEUKOCYTES UA: NEGATIVE
NITRITE UA: NEGATIVE
PROTEIN UA: NEGATIVE
Spec Grav, UA: 1.015 (ref 1.010–1.025)
UROBILINOGEN UA: 0.2 U/dL
pH, UA: 6 (ref 5.0–8.0)

## 2016-12-29 LAB — COMPREHENSIVE METABOLIC PANEL
ALT: 22 U/L (ref 0–35)
AST: 22 U/L (ref 0–37)
Albumin: 4.4 g/dL (ref 3.5–5.2)
Alkaline Phosphatase: 106 U/L (ref 39–117)
BILIRUBIN TOTAL: 0.9 mg/dL (ref 0.2–1.2)
BUN: 17 mg/dL (ref 6–23)
CALCIUM: 9.8 mg/dL (ref 8.4–10.5)
CHLORIDE: 97 meq/L (ref 96–112)
CO2: 32 meq/L (ref 19–32)
CREATININE: 0.91 mg/dL (ref 0.40–1.20)
GFR: 67.41 mL/min (ref >60.00)
GLUCOSE: 90 mg/dL (ref 70–99)
Potassium: 3.4 mEq/L — ABNORMAL LOW (ref 3.5–5.1)
SODIUM: 138 meq/L (ref 135–145)
Total Protein: 7.6 g/dL (ref 6.0–8.3)

## 2016-12-29 LAB — CBC WITH DIFFERENTIAL/PLATELET
BASOS PCT: 0.3 % (ref 0.0–3.0)
Basophils Absolute: 0 10*3/uL (ref 0.0–0.1)
EOS PCT: 1.9 % (ref 0.0–5.0)
Eosinophils Absolute: 0.1 10*3/uL (ref 0.0–0.7)
HEMATOCRIT: 42.6 % (ref 36.0–46.0)
HEMOGLOBIN: 14.5 g/dL (ref 12.0–15.0)
LYMPHS PCT: 28.8 % (ref 12.0–46.0)
Lymphs Abs: 2.1 10*3/uL (ref 0.7–4.0)
MCHC: 34 g/dL (ref 30.0–36.0)
MCV: 91.9 fl (ref 78.0–100.0)
MONO ABS: 0.4 10*3/uL (ref 0.1–1.0)
MONOS PCT: 5.8 % (ref 3.0–12.0)
Neutro Abs: 4.6 10*3/uL (ref 1.4–7.7)
Neutrophils Relative %: 63.2 % (ref 43.0–77.0)
Platelets: 274 10*3/uL (ref 150.0–400.0)
RBC: 4.64 Mil/uL (ref 3.87–5.11)
RDW: 13.4 % (ref 11.5–15.5)
WBC: 7.3 10*3/uL (ref 4.0–10.5)

## 2016-12-29 NOTE — Patient Instructions (Signed)
Preventive Care 40-64 Years, Female Preventive care refers to lifestyle choices and visits with your health care provider that can promote health and wellness. What does preventive care include?  A yearly physical exam. This is also called an annual well check.  Dental exams once or twice a year.  Routine eye exams. Ask your health care provider how often you should have your eyes checked.  Personal lifestyle choices, including: ? Daily care of your teeth and gums. ? Regular physical activity. ? Eating a healthy diet. ? Avoiding tobacco and drug use. ? Limiting alcohol use. ? Practicing safe sex. ? Taking low-dose aspirin daily starting at age 58. ? Taking vitamin and mineral supplements as recommended by your health care provider. What happens during an annual well check? The services and screenings done by your health care provider during your annual well check will depend on your age, overall health, lifestyle risk factors, and family history of disease. Counseling Your health care provider may ask you questions about your:  Alcohol use.  Tobacco use.  Drug use.  Emotional well-being.  Home and relationship well-being.  Sexual activity.  Eating habits.  Work and work Statistician.  Method of birth control.  Menstrual cycle.  Pregnancy history.  Screening You may have the following tests or measurements:  Height, weight, and BMI.  Blood pressure.  Lipid and cholesterol levels. These may be checked every 5 years, or more frequently if you are over 81 years old.  Skin check.  Lung cancer screening. You may have this screening every year starting at age 78 if you have a 30-pack-year history of smoking and currently smoke or have quit within the past 15 years.  Fecal occult blood test (FOBT) of the stool. You may have this test every year starting at age 65.  Flexible sigmoidoscopy or colonoscopy. You may have a sigmoidoscopy every 5 years or a colonoscopy  every 10 years starting at age 30.  Hepatitis C blood test.  Hepatitis B blood test.  Sexually transmitted disease (STD) testing.  Diabetes screening. This is done by checking your blood sugar (glucose) after you have not eaten for a while (fasting). You may have this done every 1-3 years.  Mammogram. This may be done every 1-2 years. Talk to your health care provider about when you should start having regular mammograms. This may depend on whether you have a family history of breast cancer.  BRCA-related cancer screening. This may be done if you have a family history of breast, ovarian, tubal, or peritoneal cancers.  Pelvic exam and Pap test. This may be done every 3 years starting at age 80. Starting at age 36, this may be done every 5 years if you have a Pap test in combination with an HPV test.  Bone density scan. This is done to screen for osteoporosis. You may have this scan if you are at high risk for osteoporosis.  Discuss your test results, treatment options, and if necessary, the need for more tests with your health care provider. Vaccines Your health care provider may recommend certain vaccines, such as:  Influenza vaccine. This is recommended every year.  Tetanus, diphtheria, and acellular pertussis (Tdap, Td) vaccine. You may need a Td booster every 10 years.  Varicella vaccine. You may need this if you have not been vaccinated.  Zoster vaccine. You may need this after age 5.  Measles, mumps, and rubella (MMR) vaccine. You may need at least one dose of MMR if you were born in  1957 or later. You may also need a second dose.  Pneumococcal 13-valent conjugate (PCV13) vaccine. You may need this if you have certain conditions and were not previously vaccinated.  Pneumococcal polysaccharide (PPSV23) vaccine. You may need one or two doses if you smoke cigarettes or if you have certain conditions.  Meningococcal vaccine. You may need this if you have certain  conditions.  Hepatitis A vaccine. You may need this if you have certain conditions or if you travel or work in places where you may be exposed to hepatitis A.  Hepatitis B vaccine. You may need this if you have certain conditions or if you travel or work in places where you may be exposed to hepatitis B.  Haemophilus influenzae type b (Hib) vaccine. You may need this if you have certain conditions.  Talk to your health care provider about which screenings and vaccines you need and how often you need them. This information is not intended to replace advice given to you by your health care provider. Make sure you discuss any questions you have with your health care provider. Document Released: 03/27/2015 Document Revised: 11/18/2015 Document Reviewed: 12/30/2014 Elsevier Interactive Patient Education  2017 [employer].

## 2016-12-29 NOTE — Progress Notes (Signed)
Subjective:     Tami Monroe is a 56 y.o. female and is here for a comprehensive physical exam. The patient reports problems - c/o L breast lump after fall -- it has improved but she still feels a pinching and strange sensation in L breast .  She also c/o cramps in legs at night,  She denies rls.  Symptoms x a few years and it is getting worse.   Pt also thinks she may have a UTI--- had a uti in august and symptoms resolved with tx but this am she felt pressure.    Social History   Social History  . Marital status: Single    Spouse name: N/A  . Number of children: N/A  . Years of education: N/A   Occupational History  . 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 Smoking status: Never Smoker  . Smokeless tobacco: Never Used  . Alcohol use Yes     Comment: OCC  . Drug use: No  . Sexual activity: Not Currently    Partners: Male   Other Topics Concern  . Not on file   Social History Narrative   -Exercise---  no      Health Maintenance  Topic Date Due  . HIV Screening  08/28/1973  . INFLUENZA VACCINE  10/12/2016  . PAP SMEAR  10/06/2017  . MAMMOGRAM  10/19/2017  . COLONOSCOPY  03/14/2018  . TETANUS/TDAP  10/08/2025  . Hepatitis C Screening  Completed    The following portions of the patient's history were reviewed and updated as appropriate:  She  has a past medical history of Allergy; Hyperlipidemia; and Hypertension. She  does not have any pertinent problems on file. She  has a past surgical history that includes Cholecystectomy (2011). Her family history includes Alcohol abuse in her father; Arthritis in her mother; Bladder Cancer (age of onset: 42) in her mother; Cancer in her unknown relative; Crohn's disease (age of onset: 27) in her brother; Diabetes in her mother; Diabetes (age of onset: 73) in her sister; Diabetes Mellitus II (age of onset: 45) in her brother; Heart disease in her mother; Hyperlipidemia in her mother; Hypertension in  her mother; Lung cancer in her father. She  reports that she has never smoked. She has never used smokeless tobacco. She reports that she drinks alcohol. She reports that she does not use drugs. She has a current medication list which includes the following prescription(s): hydrochlorothiazide. Current Outpatient Prescriptions on File Prior to Visit  Medication Sig Dispense Refill  . hydrochlorothiazide (HYDRODIURIL) 25 MG tablet Take 1 tablet (25 mg total) by mouth daily. 1 po qd prn 90 tablet 3   No current facility-administered medications on file prior to visit.    She has No Known Allergies..  Review of Systems Review of Systems  Constitutional: Negative for activity change, appetite change and fatigue.  HENT: Negative for hearing loss, congestion, tinnitus and ear discharge.  dentist--due Eyes: Negative for visual disturbance (see optho q1y -- vision corrected to 20/20 with glasses).  Respiratory: Negative for cough, chest tightness and shortness of breath.   Cardiovascular: Negative for chest pain, palpitations and leg swelling.  Gastrointestinal: Negative for abdominal pain, diarrhea, constipation and abdominal distention.  Genitourinary: Negative for urgency, frequency, decreased urine volume and difficulty urinating. + suprapubic pressure Musculoskeletal: Negative for back pain, arthralgias and gait problem.  Skin: Negative for color change, pallor and rash.  Neurological: Negative for dizziness, light-headedness, numbness and  headaches.  Hematological: Negative for adenopathy. Does not bruise/bleed easily.  Psychiatric/Behavioral: Negative for suicidal ideas, confusion, sleep disturbance, self-injury, dysphoric mood, decreased concentration and agitation.       Objective:    BP 120/80   Pulse 77   Temp 97.8 F (36.6 C) (Oral)   Ht 5\' 5"  (1.651 m)   Wt 199 lb (90.3 kg)   SpO2 99%   BMI 33.12 kg/m  General appearance: alert, cooperative, appears stated age and no  distress Head: Normocephalic, without obvious abnormality, atraumatic Eyes: conjunctivae/corneas clear. PERRL, EOM's intact. Fundi benign. Ears: normal TM's and external ear canals both ears Nose: Nares normal. Septum midline. Mucosa normal. No drainage or sinus tenderness. Throat: lips, mucosa, and tongue normal; teeth and gums normal Neck: no adenopathy, no carotid bruit, no JVD, supple, symmetrical, trachea midline and thyroid not enlarged, symmetric, no tenderness/mass/nodules Back: symmetric, no curvature. ROM normal. No CVA tenderness. Lungs: clear to auscultation bilaterally Breasts: positive findings: thickening 6 o'clock L breast  Heart: regular rate and rhythm, S1, S2 normal, no murmur, click, rub or gallop Abdomen: soft, non-tender; bowel sounds normal; no masses,  no organomegaly Pelvic: cervix normal in appearance, external genitalia normal, no adnexal masses or tenderness, no bladder tenderness and no cervical motion tenderness -rectal- heme neg brown stool Extremities: extremities normal, atraumatic, no cyanosis or edema Pulses: 2+ and symmetric Skin: Skin color, texture, turgor normal. No rashes or lesions Lymph nodes: Cervical, supraclavicular, and axillary nodes normal. Neurologic: Alert and oriented X 3, normal strength and tone. Normal symmetric reflexes. Normal coordination and gait    Assessment:    Healthy female exam.      Plan:     ghm utd Check labs See After Visit Summary for Counseling Recommendations    1. Need for immunization against influenza  - Flu Vaccine QUAD 6+ mos IM (Fluarix)  2. Screening for cervical cancer   - Cytology - PAP  3. Urinary frequency  - POCT Urinalysis Dipstick - CULTURE, URINE COMPREHENSIVE - POCT Urinalysis Dipstick (Automated)  4. Essential hypertension Well controlled, no changes to meds. Encouraged heart healthy diet such as the DASH diet and exercise as tolerated.  - CBC with Differential/Platelet - Lipid  panel - TSH - Comprehensive metabolic panel - POCT Urinalysis Dipstick (Automated)  5. Preventative health care See above - CBC with Differential/Platelet - Lipid panel - TSH - Comprehensive metabolic panel - POCT Urinalysis Dipstick (Automated)  6. Breast cancer screening   7. Estrogen deficiency  - DG Bone Density; Future  8. Suspicious nevus  - Ambulatory referral to Dermatology  9. Mass of left breast  - MM DIAG BREAST TOMO BILATERAL; Future - US BREAST LTD UNI LEFT INC AXILLA; Future  10. Leg cramps Check labs

## 2016-12-31 LAB — CULTURE, URINE COMPREHENSIVE
MICRO NUMBER: 81165443
SPECIMEN QUALITY:: ADEQUATE

## 2017-01-02 ENCOUNTER — Other Ambulatory Visit: Payer: Self-pay | Admitting: Family Medicine

## 2017-01-02 DIAGNOSIS — E876 Hypokalemia: Secondary | ICD-10-CM

## 2017-01-02 DIAGNOSIS — E785 Hyperlipidemia, unspecified: Secondary | ICD-10-CM

## 2017-01-02 MED ORDER — CIPROFLOXACIN HCL 250 MG PO TABS: 250.0000 mg | ORAL_TABLET | Freq: Two times a day (BID) | ORAL | 0 refills | Status: DC

## 2017-01-03 LAB — CYTOLOGY - PAP
Diagnosis: NEGATIVE
HPV: NOT DETECTED

## 2017-01-10 ENCOUNTER — Ambulatory Visit (HOSPITAL_BASED_OUTPATIENT_CLINIC_OR_DEPARTMENT_OTHER)
Admission: RE | Admit: 2017-01-10 | Discharge: 2017-01-10 | Disposition: A | Discharge status: Home / Self Care | Payer: BLUE CROSS/BLUE SHIELD | Source: Ambulatory Visit | Attending: Family Medicine | Admitting: Family Medicine

## 2017-01-10 DIAGNOSIS — E2839 Other primary ovarian failure: Secondary | ICD-10-CM | POA: Diagnosis not present

## 2017-01-10 DIAGNOSIS — Z87891 Personal history of nicotine dependence: Secondary | ICD-10-CM | POA: Insufficient documentation

## 2017-01-10 DIAGNOSIS — Z780 Asymptomatic menopausal state: Secondary | ICD-10-CM | POA: Insufficient documentation

## 2017-01-12 ENCOUNTER — Ambulatory Visit
Admission: RE | Admit: 2017-01-12 | Discharge: 2017-01-12 | Disposition: A | Discharge status: Home / Self Care | Payer: BLUE CROSS/BLUE SHIELD | Source: Ambulatory Visit | Attending: Family Medicine | Admitting: Family Medicine

## 2017-01-12 ENCOUNTER — Ambulatory Visit: Payer: BLUE CROSS/BLUE SHIELD

## 2017-01-12 DIAGNOSIS — R928 Other abnormal and inconclusive findings on diagnostic imaging of breast: Secondary | ICD-10-CM | POA: Diagnosis not present

## 2017-01-12 DIAGNOSIS — N6320 Unspecified lump in the left breast, unspecified quadrant: Secondary | ICD-10-CM

## 2017-01-30 DIAGNOSIS — Z23 Encounter for immunization: Secondary | ICD-10-CM | POA: Diagnosis not present

## 2017-01-30 DIAGNOSIS — L814 Other melanin hyperpigmentation: Secondary | ICD-10-CM | POA: Diagnosis not present

## 2017-01-30 DIAGNOSIS — L821 Other seborrheic keratosis: Secondary | ICD-10-CM | POA: Diagnosis not present

## 2017-04-03 ENCOUNTER — Telehealth: Payer: Self-pay | Admitting: Family Medicine

## 2017-04-03 NOTE — Telephone Encounter (Signed)
Called left pt a message the lab will not be open until 8:00am tomorrow 04/04/17 and she needs to move her time.

## 2017-04-04 ENCOUNTER — Other Ambulatory Visit: Payer: BLUE CROSS/BLUE SHIELD

## 2017-04-06 ENCOUNTER — Other Ambulatory Visit (INDEPENDENT_AMBULATORY_CARE_PROVIDER_SITE_OTHER): Payer: BLUE CROSS/BLUE SHIELD

## 2017-04-06 DIAGNOSIS — E785 Hyperlipidemia, unspecified: Secondary | ICD-10-CM | POA: Diagnosis not present

## 2017-04-06 DIAGNOSIS — E876 Hypokalemia: Secondary | ICD-10-CM

## 2017-04-06 LAB — COMPREHENSIVE METABOLIC PANEL
ALBUMIN: 4.2 g/dL (ref 3.5–5.2)
ALT: 25 U/L (ref 0–35)
AST: 25 U/L (ref 0–37)
Alkaline Phosphatase: 95 U/L (ref 39–117)
BUN: 18 mg/dL (ref 6–23)
CALCIUM: 9.6 mg/dL (ref 8.4–10.5)
CHLORIDE: 100 meq/L (ref 96–112)
CO2: 30 mEq/L (ref 19–32)
Creatinine, Ser: 0.96 mg/dL (ref 0.40–1.20)
GFR: 63.31 mL/min (ref >60.00)
Glucose, Bld: 107 mg/dL — ABNORMAL HIGH (ref 70–99)
POTASSIUM: 4.6 meq/L (ref 3.5–5.1)
Sodium: 139 mEq/L (ref 135–145)
TOTAL PROTEIN: 7.2 g/dL (ref 6.0–8.3)
Total Bilirubin: 0.8 mg/dL (ref 0.2–1.2)

## 2017-04-06 LAB — LIPID PANEL
CHOLESTEROL: 248 mg/dL — AB (ref 0–200)
HDL: 45.4 mg/dL (ref >39.00)
LDL CALC: 169 mg/dL — AB (ref 0–99)
NonHDL: 202.33
TRIGLYCERIDES: 166 mg/dL — AB (ref 0.0–149.0)
Total CHOL/HDL Ratio: 5
VLDL: 33.2 mg/dL (ref 0.0–40.0)

## 2017-04-12 ENCOUNTER — Other Ambulatory Visit: Payer: Self-pay

## 2017-04-12 DIAGNOSIS — E785 Hyperlipidemia, unspecified: Secondary | ICD-10-CM

## 2017-06-26 DIAGNOSIS — L814 Other melanin hyperpigmentation: Secondary | ICD-10-CM | POA: Diagnosis not present

## 2017-06-26 DIAGNOSIS — D1801 Hemangioma of skin and subcutaneous tissue: Secondary | ICD-10-CM | POA: Diagnosis not present

## 2017-06-26 DIAGNOSIS — L570 Actinic keratosis: Secondary | ICD-10-CM | POA: Diagnosis not present

## 2017-06-26 DIAGNOSIS — L719 Rosacea, unspecified: Secondary | ICD-10-CM | POA: Diagnosis not present

## 2017-06-26 DIAGNOSIS — L821 Other seborrheic keratosis: Secondary | ICD-10-CM | POA: Diagnosis not present

## 2017-07-11 ENCOUNTER — Other Ambulatory Visit (INDEPENDENT_AMBULATORY_CARE_PROVIDER_SITE_OTHER): Payer: BLUE CROSS/BLUE SHIELD

## 2017-07-11 DIAGNOSIS — E785 Hyperlipidemia, unspecified: Secondary | ICD-10-CM | POA: Diagnosis not present

## 2017-07-11 LAB — LIPID PANEL
CHOLESTEROL: 254 mg/dL — AB (ref 0–200)
HDL: 47.3 mg/dL (ref >39.00)
LDL CALC: 177 mg/dL — AB (ref 0–99)
NonHDL: 207.08
Total CHOL/HDL Ratio: 5
Triglycerides: 148 mg/dL (ref 0.0–149.0)
VLDL: 29.6 mg/dL (ref 0.0–40.0)

## 2017-07-11 LAB — COMPREHENSIVE METABOLIC PANEL
ALBUMIN: 4.2 g/dL (ref 3.5–5.2)
ALT: 43 U/L — ABNORMAL HIGH (ref 0–35)
AST: 32 U/L (ref 0–37)
Alkaline Phosphatase: 87 U/L (ref 39–117)
BUN: 20 mg/dL (ref 6–23)
CALCIUM: 9.7 mg/dL (ref 8.4–10.5)
CHLORIDE: 100 meq/L (ref 96–112)
CO2: 31 mEq/L (ref 19–32)
Creatinine, Ser: 0.98 mg/dL (ref 0.40–1.20)
GFR: 61.77 mL/min (ref >60.00)
Glucose, Bld: 104 mg/dL — ABNORMAL HIGH (ref 70–99)
POTASSIUM: 4.4 meq/L (ref 3.5–5.1)
SODIUM: 140 meq/L (ref 135–145)
Total Bilirubin: 0.9 mg/dL (ref 0.2–1.2)
Total Protein: 7 g/dL (ref 6.0–8.3)

## 2017-11-01 ENCOUNTER — Encounter: Payer: Self-pay | Admitting: Family Medicine

## 2017-11-01 ENCOUNTER — Telehealth: Payer: Self-pay | Admitting: Family Medicine

## 2017-11-01 ENCOUNTER — Ambulatory Visit: Payer: BLUE CROSS/BLUE SHIELD | Admitting: Family Medicine

## 2017-11-01 VITALS — BP 120/78 | HR 68 | Temp 98.2°F | Ht 65.0 in | Wt 190.4 lb

## 2017-11-01 DIAGNOSIS — R350 Frequency of micturition: Secondary | ICD-10-CM

## 2017-11-01 LAB — POC URINALSYSI DIPSTICK (AUTOMATED)
Bilirubin, UA: NEGATIVE
Blood, UA: NEGATIVE
Glucose, UA: NEGATIVE
Ketones, UA: NEGATIVE
Leukocytes, UA: NEGATIVE
Nitrite, UA: NEGATIVE
Protein, UA: NEGATIVE
Spec Grav, UA: 1.015
Urobilinogen, UA: 0.2 U/dL
pH, UA: 5.5

## 2017-11-01 MED ORDER — SULFAMETHOXAZOLE-TRIMETHOPRIM 800-160 MG PO TABS: 1.0000 | ORAL_TABLET | Freq: Two times a day (BID) | ORAL | 0 refills | Status: AC

## 2017-11-01 NOTE — Progress Notes (Signed)
Pre visit review using our clinic review tool, if applicable. No additional management support is needed unless otherwise documented below in the visit note. 

## 2017-11-01 NOTE — Telephone Encounter (Signed)
Hydrochlorothiazide 25 mg refill request  Has appt with Dr. Nani Ravens this morning (8/21) at 11:30.    Address during OV?

## 2017-11-01 NOTE — Patient Instructions (Signed)
Stay hydrated.   Warning signs/symptoms: Uncontrollable nausea/vomiting, fevers, worsening symptoms despite treatment, confusion.  Give us around 2 business days to get culture back to you.  Let us know if you need anything. 

## 2017-11-01 NOTE — Progress Notes (Signed)
Chief Complaint  Patient presents with  . Urinary Frequency    pressure    Tami Monroe is a 57 y.o. female here for possible UTI.  Duration: 2 months. Symptoms: urinary frequency, urgency, cloudiness, odor, pressure over bladder Denies: hematuria, urinary hesitancy, urinary retention, fever, nausea, vomiting and flank pain, vaginal discharge Hx of recurrent UTI? No  Denies new sexual partners. Feels like previous UTI's.   ROS:  Constitutional: denies fever GU: As noted in HPI  Past Medical History:  Diagnosis Date  . Allergy   . Hyperlipidemia   . Hypertension     BP 120/78 (BP Location: Left Arm, Patient Position: Sitting, Cuff Size: Normal)   Pulse 68   Temp 98.2 F (36.8 C) (Oral)   Ht 5\' 5"  (1.651 m)   Wt 190 lb 6 oz (86.4 kg)   SpO2 97%   BMI 31.68 kg/m  General: Awake, alert, appears stated age Heart: RRR Lungs: CTAB, normal respiratory effort, no accessory muscle usage Abd: BS+, soft, NT, ND, no masses or organomegaly MSK: No CVA tenderness, neg Lloyd's sign Psych: Age appropriate judgment and insight  Frequent urination - Plan: POCT Urinalysis Dipstick (Automated), Urine Culture, sulfamethoxazole-trimethoprim (BACTRIM DS,SEPTRA DS) 800-160 MG tablet  UA neg. Based off of s/s's, will tx. Await culture. Stay hydrated. Seek immediate care if pt starts to develop fevers, new/worsening symptoms, uncontrollable N/V. F/u prn. The patient voiced understanding and agreement to the plan.  [place], DO 11/01/17 11:35 AM

## 2017-11-01 NOTE — Telephone Encounter (Signed)
Copied from [place] [id number]. Topic: Quick Communication - Rx Refill/Question >> Nov 01, 2017 10:27 AM Scherrie Gerlach wrote: Medication: hydrochlorothiazide (HYDRODIURIL) 25 MG tablet   Pt has cpe on 01/04/18 and will be traveling most of Sept. Pt requesting enough to get her through to this appt. [id number] - [place], [place] - [address] AT [place] [address] [phone] (Phone) [phone] (Fax)

## 2017-11-03 ENCOUNTER — Other Ambulatory Visit: Payer: Self-pay | Admitting: Family Medicine

## 2017-11-03 DIAGNOSIS — R600 Localized edema: Secondary | ICD-10-CM

## 2017-11-03 LAB — URINE CULTURE
MICRO NUMBER:: 90996706
SPECIMEN QUALITY:: ADEQUATE

## 2018-01-04 ENCOUNTER — Ambulatory Visit (INDEPENDENT_AMBULATORY_CARE_PROVIDER_SITE_OTHER): Payer: BLUE CROSS/BLUE SHIELD | Admitting: Family Medicine

## 2018-01-04 ENCOUNTER — Ambulatory Visit (HOSPITAL_BASED_OUTPATIENT_CLINIC_OR_DEPARTMENT_OTHER)
Admission: RE | Admit: 2018-01-04 | Discharge: 2018-01-04 | Disposition: A | Discharge status: Home / Self Care | Payer: BLUE CROSS/BLUE SHIELD | Source: Ambulatory Visit | Attending: Family Medicine | Admitting: Family Medicine

## 2018-01-04 ENCOUNTER — Encounter: Payer: Self-pay | Admitting: Family Medicine

## 2018-01-04 VITALS — BP 122/80 | HR 52 | Temp 98.4°F | Resp 16 | Ht 64.5 in | Wt 195.4 lb

## 2018-01-04 DIAGNOSIS — Z0000 Encounter for general adult medical examination without abnormal findings: Secondary | ICD-10-CM

## 2018-01-04 DIAGNOSIS — R600 Localized edema: Secondary | ICD-10-CM

## 2018-01-04 DIAGNOSIS — M25551 Pain in right hip: Secondary | ICD-10-CM

## 2018-01-04 DIAGNOSIS — Z23 Encounter for immunization: Secondary | ICD-10-CM

## 2018-01-04 DIAGNOSIS — M533 Sacrococcygeal disorders, not elsewhere classified: Secondary | ICD-10-CM | POA: Insufficient documentation

## 2018-01-04 HISTORY — DX: Sacrococcygeal disorders, not elsewhere classified: M53.3

## 2018-01-04 LAB — LIPID PANEL
CHOLESTEROL: 240 mg/dL — AB (ref 0–200)
HDL: 46.4 mg/dL (ref >39.00)
LDL Cholesterol: 156 mg/dL — ABNORMAL HIGH (ref 0–99)
NonHDL: 194.09
TRIGLYCERIDES: 192 mg/dL — AB (ref 0.0–149.0)
Total CHOL/HDL Ratio: 5
VLDL: 38.4 mg/dL (ref 0.0–40.0)

## 2018-01-04 LAB — POC URINALSYSI DIPSTICK (AUTOMATED)
Bilirubin, UA: NEGATIVE
Blood, UA: NEGATIVE
Glucose, UA: NEGATIVE
Ketones, UA: NEGATIVE
Leukocytes, UA: NEGATIVE
Nitrite, UA: NEGATIVE
PH UA: 6 (ref 5.0–8.0)
Protein, UA: NEGATIVE
SPEC GRAV UA: 1.015 (ref 1.010–1.025)
UROBILINOGEN UA: 0.2 U/dL

## 2018-01-04 LAB — CBC WITH DIFFERENTIAL/PLATELET
BASOS ABS: 0 10*3/uL (ref 0.0–0.1)
Basophils Relative: 0.4 % (ref 0.0–3.0)
EOS ABS: 0.1 10*3/uL (ref 0.0–0.7)
Eosinophils Relative: 2.8 % (ref 0.0–5.0)
HCT: 41.7 % (ref 36.0–46.0)
Hemoglobin: 14.4 g/dL (ref 12.0–15.0)
LYMPHS ABS: 2 10*3/uL (ref 0.7–4.0)
Lymphocytes Relative: 37.7 % (ref 12.0–46.0)
MCHC: 34.5 g/dL (ref 30.0–36.0)
MCV: 90.4 fl (ref 78.0–100.0)
MONO ABS: 0.4 10*3/uL (ref 0.1–1.0)
Monocytes Relative: 7.7 % (ref 3.0–12.0)
NEUTROS PCT: 51.4 % (ref 43.0–77.0)
Neutro Abs: 2.7 10*3/uL (ref 1.4–7.7)
Platelets: 242 10*3/uL (ref 150.0–400.0)
RBC: 4.61 Mil/uL (ref 3.87–5.11)
RDW: 13.4 % (ref 11.5–15.5)
WBC: 5.2 10*3/uL (ref 4.0–10.5)

## 2018-01-04 LAB — COMPREHENSIVE METABOLIC PANEL
ALBUMIN: 4.3 g/dL (ref 3.5–5.2)
ALK PHOS: 100 U/L (ref 39–117)
ALT: 19 U/L (ref 0–35)
AST: 17 U/L (ref 0–37)
BUN: 22 mg/dL (ref 6–23)
CALCIUM: 9.7 mg/dL (ref 8.4–10.5)
CO2: 32 mEq/L (ref 19–32)
CREATININE: 0.93 mg/dL (ref 0.40–1.20)
Chloride: 99 mEq/L (ref 96–112)
GFR: 65.51 mL/min (ref >60.00)
Glucose, Bld: 93 mg/dL (ref 70–99)
POTASSIUM: 3.7 meq/L (ref 3.5–5.1)
Sodium: 139 mEq/L (ref 135–145)
TOTAL PROTEIN: 6.8 g/dL (ref 6.0–8.3)
Total Bilirubin: 0.6 mg/dL (ref 0.2–1.2)

## 2018-01-04 LAB — TSH: TSH: 1.74 u[IU]/mL (ref 0.35–4.50)

## 2018-01-04 MED ORDER — HYDROCHLOROTHIAZIDE 25 MG PO TABS: 25.0000 mg | ORAL_TABLET | Freq: Every day | ORAL | 3 refills | Status: DC | PRN

## 2018-01-04 NOTE — Addendum Note (Signed)
Addended by: Kem Boroughs D on: 01/04/2018 09:32 AM   Modules accepted: Orders

## 2018-01-04 NOTE — Assessment & Plan Note (Signed)
Elevate ext

## 2018-01-04 NOTE — Assessment & Plan Note (Signed)
con't tylenol / nsaid prn  Xray  Consider referral to sport med / ortho

## 2018-01-04 NOTE — Patient Instructions (Signed)
Preventive Care 40-64 Years, Female Preventive care refers to lifestyle choices and visits with your health care provider that can promote health and wellness. What does preventive care include?  A yearly physical exam. This is also called an annual well check.  Dental exams once or twice a year.  Routine eye exams. Ask your health care provider how often you should have your eyes checked.  Personal lifestyle choices, including: ? Daily care of your teeth and gums. ? Regular physical activity. ? Eating a healthy diet. ? Avoiding tobacco and drug use. ? Limiting alcohol use. ? Practicing safe sex. ? Taking low-dose aspirin daily starting at age 58. ? Taking vitamin and mineral supplements as recommended by your health care provider. What happens during an annual well check? The services and screenings done by your health care provider during your annual well check will depend on your age, overall health, lifestyle risk factors, and family history of disease. Counseling Your health care provider may ask you questions about your:  Alcohol use.  Tobacco use.  Drug use.  Emotional well-being.  Home and relationship well-being.  Sexual activity.  Eating habits.  Work and work Statistician.  Method of birth control.  Menstrual cycle.  Pregnancy history.  Screening You may have the following tests or measurements:  Height, weight, and BMI.  Blood pressure.  Lipid and cholesterol levels. These may be checked every 5 years, or more frequently if you are over 81 years old.  Skin check.  Lung cancer screening. You may have this screening every year starting at age 78 if you have a 30-pack-year history of smoking and currently smoke or have quit within the past 15 years.  Fecal occult blood test (FOBT) of the stool. You may have this test every year starting at age 65.  Flexible sigmoidoscopy or colonoscopy. You may have a sigmoidoscopy every 5 years or a colonoscopy  every 10 years starting at age 30.  Hepatitis C blood test.  Hepatitis B blood test.  Sexually transmitted disease (STD) testing.  Diabetes screening. This is done by checking your blood sugar (glucose) after you have not eaten for a while (fasting). You may have this done every 1-3 years.  Mammogram. This may be done every 1-2 years. Talk to your health care provider about when you should start having regular mammograms. This may depend on whether you have a family history of breast cancer.  BRCA-related cancer screening. This may be done if you have a family history of breast, ovarian, tubal, or peritoneal cancers.  Pelvic exam and Pap test. This may be done every 3 years starting at age 80. Starting at age 36, this may be done every 5 years if you have a Pap test in combination with an HPV test.  Bone density scan. This is done to screen for osteoporosis. You may have this scan if you are at high risk for osteoporosis.  Discuss your test results, treatment options, and if necessary, the need for more tests with your health care provider. Vaccines Your health care provider may recommend certain vaccines, such as:  Influenza vaccine. This is recommended every year.  Tetanus, diphtheria, and acellular pertussis (Tdap, Td) vaccine. You may need a Td booster every 10 years.  Varicella vaccine. You may need this if you have not been vaccinated.  Zoster vaccine. You may need this after age 5.  Measles, mumps, and rubella (MMR) vaccine. You may need at least one dose of MMR if you were born in  1957 or later. You may also need a second dose.  Pneumococcal 13-valent conjugate (PCV13) vaccine. You may need this if you have certain conditions and were not previously vaccinated.  Pneumococcal polysaccharide (PPSV23) vaccine. You may need one or two doses if you smoke cigarettes or if you have certain conditions.  Meningococcal vaccine. You may need this if you have certain  conditions.  Hepatitis A vaccine. You may need this if you have certain conditions or if you travel or work in places where you may be exposed to hepatitis A.  Hepatitis B vaccine. You may need this if you have certain conditions or if you travel or work in places where you may be exposed to hepatitis B.  Haemophilus influenzae type b (Hib) vaccine. You may need this if you have certain conditions.  Talk to your health care provider about which screenings and vaccines you need and how often you need them. This information is not intended to replace advice given to you by your health care provider. Make sure you discuss any questions you have with your health care provider. Document Released: 03/27/2015 Document Revised: 11/18/2015 Document Reviewed: 12/30/2014 Elsevier Interactive Patient Education  2018 [employer]  

## 2018-01-04 NOTE — Progress Notes (Signed)
Subjective:     Tami Monroe is a 57 y.o. female and is here for a comprehensive physical exam. The patient reports R hip pain x several months.  No specific injury per pt.  Sometimes her leg gives out on her   Social History   Socioeconomic History  . Marital status: Single    Spouse name: Not on file  . Number of children: Not on file  . Years of education: Not on file  . Highest education level: Not on file  Occupational History  . Occupation: Freight forwarder in [employer]: [employer]  . Financial resource strain: Not on file  . Food insecurity:    Worry: Not on file    Inability: Not on file  . Transportation needs:    Medical: Not on file    Non-medical: Not on file  Tobacco Use  . Smoking status: Never Smoker  . Smokeless tobacco: Never Used  Substance and Sexual Activity  . Alcohol use: Yes    Comment: OCC  . Drug use: No  . Sexual activity: Not Currently    Partners: Male  Lifestyle  . Physical activity:    Days per week: Not on file    Minutes per session: Not on file  . Stress: Not on file  Relationships  . Social connections:    Talks on phone: Not on file    Gets together: Not on file    Attends religious service: Not on file    Active member of club or organization: Not on file    Attends meetings of clubs or organizations: Not on file    Relationship status: Not on file  . Intimate partner violence:    Fear of current or ex partner: Not on file    Emotionally abused: Not on file    Physically abused: Not on file    Forced sexual activity: Not on file  Other Topics Concern  . Not on file  Social History Narrative   -Exercise---  no   Health Maintenance  Topic Date Due  . INFLUENZA VACCINE  10/12/2017  . HIV Screening  01/05/2024 (Originally 08/28/1975)  . MAMMOGRAM  01/12/2018  . COLONOSCOPY  03/14/2018  . PAP SMEAR  12/30/2019  . TETANUS/TDAP  10/08/2025  . Hepatitis C Screening  Completed    The following  portions of the patient's history were reviewed and updated as appropriate:  She  has a past medical history of Allergy, Hyperlipidemia, and Hypertension. She does not have any pertinent problems on file. She  has a past surgical history that includes Cholecystectomy (2011). Her family history includes Alcohol abuse in her father; Arthritis in her mother; Bladder Cancer (age of onset: 69) in her mother; Cancer in her unknown relative; Crohn's disease (age of onset: 72) in her brother; Diabetes in her mother; Diabetes (age of onset: 25) in her sister; Diabetes Mellitus II (age of onset: 56) in her brother; Heart disease in her mother; Hyperlipidemia in her mother; Hypertension in her mother; Lung cancer in her father; Vision loss in her sister. She  reports that she has never smoked. She has never used smokeless tobacco. She reports that she drinks alcohol. She reports that she does not use drugs. She has a current medication list which includes the following prescription(s): cyanocobalamin and hydrochlorothiazide. Current Outpatient Medications on File Prior to Visit  Medication Sig Dispense Refill  . Cyanocobalamin (VITAMIN B12 PO) Take 1 tablet by mouth daily.  No current facility-administered medications on file prior to visit.    She has No Known Allergies..  Review of Systems Review of Systems  Constitutional: Negative for activity change, appetite change and fatigue.  HENT: Negative for hearing loss, congestion, tinnitus and ear discharge.  dentist q75m Eyes: Negative for visual disturbance (see optho q1y -- vision corrected to 20/20 with glasses).  Respiratory: Negative for cough, chest tightness and shortness of breath.   Cardiovascular: Negative for chest pain, palpitations and leg swelling.  Gastrointestinal: Negative for abdominal pain, diarrhea, constipation and abdominal distention.  Genitourinary: Negative for urgency, frequency, decreased urine volume and difficulty urinating.   Musculoskeletal: Negative for back pain, arthralgias and gait problem.  Skin: Negative for color change, pallor and rash.  Neurological: Negative for dizziness, light-headedness, numbness and headaches.  Hematological: Negative for adenopathy. Does not bruise/bleed easily.  Psychiatric/Behavioral: Negative for suicidal ideas, confusion, sleep disturbance, self-injury, dysphoric mood, decreased concentration and agitation.       Objective:    BP 122/80 (BP Location: Right Arm, Cuff Size: Large)   Pulse (!) 52   Temp 98.4 F (36.9 C) (Oral)   Resp 16   Ht 5' 4.5" (1.638 m)   Wt 195 lb 6.4 oz (88.6 kg)   SpO2 98%   BMI 33.02 kg/m  General appearance: alert, cooperative, appears stated age and no distress Head: Normocephalic, without obvious abnormality, atraumatic Eyes: conjunctivae/corneas clear. PERRL, EOM's intact. Fundi benign. Ears: normal TM's and external ear canals both ears Nose: Nares normal. Septum midline. Mucosa normal. No drainage or sinus tenderness. Throat: lips, mucosa, and tongue normal; teeth and gums normal Neck: no adenopathy, no carotid bruit, no JVD, supple, symmetrical, trachea midline and thyroid not enlarged, symmetric, no tenderness/mass/nodules Back: symmetric, no curvature. ROM normal. No CVA tenderness. Lungs: clear to auscultation bilaterally Breasts: normal appearance, no masses or tenderness Heart: regular rate and rhythm, S1, S2 normal, no murmur, click, rub or gallop Abdomen: soft, non-tender; bowel sounds normal; no masses,  no organomegaly Pelvic: deferred Extremities: extremities normal, atraumatic, no cyanosis or edema Pulses: 2+ and symmetric Skin: Skin color, texture, turgor normal. No rashes or lesions Lymph nodes: Cervical, supraclavicular, and axillary nodes normal. Neurologic: Alert and oriented X 3, normal strength and tone. Normal symmetric reflexes. Normal coordination and gait    Assessment:    Healthy female exam.       Plan:     ghm utd Check labs See After Visit Summary for Counseling Recommendations    1. Preventative health care See above - POCT Urinalysis Dipstick (Automated) - CBC with Differential/Platelet - Lipid panel - Comprehensive metabolic panel - TSH  2. Bilateral edema of lower extremity Elevate ext Compression socks Drink plenty of water  - hydrochlorothiazide (HYDRODIURIL) 25 MG tablet; Take 1 tablet (25 mg total) by mouth daily as needed.  Dispense: 90 tablet; Refill: 3  3. Morbid obesity ([place])   - Amb Ref to Medical Weight Management  4. Pain of right hip joint Tylenol, nsaid Xray today   - DG HIP UNILAT WITH PELVIS 2-3 VIEWS RIGHT; Future  5. Lower extremity edema

## 2018-01-11 ENCOUNTER — Encounter: Payer: Self-pay | Admitting: *Deleted

## 2018-01-11 ENCOUNTER — Other Ambulatory Visit: Payer: Self-pay | Admitting: *Deleted

## 2018-01-11 DIAGNOSIS — E785 Hyperlipidemia, unspecified: Secondary | ICD-10-CM

## 2018-01-12 ENCOUNTER — Ambulatory Visit (INDEPENDENT_AMBULATORY_CARE_PROVIDER_SITE_OTHER): Payer: BLUE CROSS/BLUE SHIELD

## 2018-01-12 DIAGNOSIS — Z23 Encounter for immunization: Secondary | ICD-10-CM

## 2018-03-16 ENCOUNTER — Ambulatory Visit (INDEPENDENT_AMBULATORY_CARE_PROVIDER_SITE_OTHER): Payer: BLUE CROSS/BLUE SHIELD

## 2018-03-16 DIAGNOSIS — Z23 Encounter for immunization: Secondary | ICD-10-CM

## 2018-03-27 ENCOUNTER — Encounter (INDEPENDENT_AMBULATORY_CARE_PROVIDER_SITE_OTHER): Payer: Self-pay

## 2018-04-02 ENCOUNTER — Ambulatory Visit (INDEPENDENT_AMBULATORY_CARE_PROVIDER_SITE_OTHER): Payer: BLUE CROSS/BLUE SHIELD | Admitting: Bariatrics

## 2018-04-02 ENCOUNTER — Encounter (INDEPENDENT_AMBULATORY_CARE_PROVIDER_SITE_OTHER): Payer: Self-pay | Admitting: Bariatrics

## 2018-04-02 VITALS — BP 128/83 | HR 51 | Temp 97.6°F | Ht 65.0 in | Wt 198.0 lb

## 2018-04-02 DIAGNOSIS — Z6833 Body mass index (BMI) 33.0-33.9, adult: Secondary | ICD-10-CM

## 2018-04-02 DIAGNOSIS — R5383 Other fatigue: Secondary | ICD-10-CM

## 2018-04-02 DIAGNOSIS — Z0289 Encounter for other administrative examinations: Secondary | ICD-10-CM

## 2018-04-02 DIAGNOSIS — Z1331 Encounter for screening for depression: Secondary | ICD-10-CM | POA: Diagnosis not present

## 2018-04-02 DIAGNOSIS — R7309 Other abnormal glucose: Secondary | ICD-10-CM | POA: Diagnosis not present

## 2018-04-02 DIAGNOSIS — E538 Deficiency of other specified B group vitamins: Secondary | ICD-10-CM

## 2018-04-02 DIAGNOSIS — R0602 Shortness of breath: Secondary | ICD-10-CM | POA: Diagnosis not present

## 2018-04-02 DIAGNOSIS — Z9189 Other specified personal risk factors, not elsewhere classified: Secondary | ICD-10-CM | POA: Diagnosis not present

## 2018-04-02 DIAGNOSIS — E559 Vitamin D deficiency, unspecified: Secondary | ICD-10-CM

## 2018-04-02 DIAGNOSIS — I10 Essential (primary) hypertension: Secondary | ICD-10-CM | POA: Diagnosis not present

## 2018-04-02 DIAGNOSIS — E7849 Other hyperlipidemia: Secondary | ICD-10-CM

## 2018-04-02 DIAGNOSIS — F3289 Other specified depressive episodes: Secondary | ICD-10-CM

## 2018-04-02 DIAGNOSIS — E66811 Obesity, class 1: Secondary | ICD-10-CM

## 2018-04-02 DIAGNOSIS — E669 Obesity, unspecified: Secondary | ICD-10-CM

## 2018-04-03 LAB — VITAMIN D 25 HYDROXY (VIT D DEFICIENCY, FRACTURES): Vit D, 25-Hydroxy: 15.9 ng/mL — ABNORMAL LOW (ref 30.0–100.0)

## 2018-04-03 LAB — HEMOGLOBIN A1C
Est. average glucose Bld gHb Est-mCnc: 111 mg/dL
HEMOGLOBIN A1C: 5.5 % (ref 4.8–5.6)

## 2018-04-03 LAB — INSULIN, RANDOM: INSULIN: 10.9 u[IU]/mL (ref 2.6–24.9)

## 2018-04-03 LAB — COMPREHENSIVE METABOLIC PANEL
ALK PHOS: 117 IU/L (ref 39–117)
ALT: 36 IU/L — ABNORMAL HIGH (ref 0–32)
AST: 35 IU/L (ref 0–40)
Albumin/Globulin Ratio: 1.9 (ref 1.2–2.2)
Albumin: 4.5 g/dL (ref 3.8–4.9)
BUN/Creatinine Ratio: 15 (ref 9–23)
BUN: 15 mg/dL (ref 6–24)
Bilirubin Total: 0.7 mg/dL (ref 0.0–1.2)
CO2: 23 mmol/L (ref 20–29)
CREATININE: 0.99 mg/dL (ref 0.57–1.00)
Calcium: 9.8 mg/dL (ref 8.7–10.2)
Chloride: 97 mmol/L (ref 96–106)
GFR calc Af Amer: 72 mL/min/{1.73_m2} (ref >59)
GFR calc non Af Amer: 63 mL/min/{1.73_m2} (ref >59)
Globulin, Total: 2.4 g/dL (ref 1.5–4.5)
Glucose: 88 mg/dL (ref 65–99)
Potassium: 4 mmol/L (ref 3.5–5.2)
SODIUM: 138 mmol/L (ref 134–144)
Total Protein: 6.9 g/dL (ref 6.0–8.5)

## 2018-04-03 LAB — VITAMIN B12

## 2018-04-03 NOTE — Progress Notes (Signed)
Office: [phone]  /  Fax: [phone]   Dear Dr. Carollee Monroe,   Thank you for referring Tami Monroe to our clinic. The following note includes my evaluation and treatment recommendations.  HPI:   Chief Complaint: OBESITY    Tami Monroe has been referred by Tami Held, DO for consultation regarding her obesity and obesity related comorbidities.    Tami Monroe (MR# [id number]) is a 58 y.o. female who presents on 04/03/2018 for obesity evaluation and treatment. Current BMI is Body mass index is 32.95 kg/m.Marland Kitchen Tami Monroe has been struggling with her weight for many years and has been unsuccessful in either losing weight, maintaining weight loss, or reaching her healthy weight goal.     Tami Monroe attended our information session and states she is currently in the action stage of change and ready to dedicate time achieving and maintaining a healthier weight. Tami Monroe is interested in becoming our patient and working on intensive lifestyle modifications including (but not limited to) diet, exercise and weight loss.    Tami Monroe states she struggles with family and or coworkers weight loss sabotage her desired weight loss is 48 lbs she started gaining weight in her 20's her heaviest weight ever was 210 lbs. she snacks frequently in the evenings she snacks on carbohydrates she frequently eats outside the home she frequently makes poor food choices she has problems with excessive hunger  she frequently eats larger portions than normal  she has binge eating behaviors she struggles with emotional eating    Tami Monroe feels her energy is lower than it should be. This has worsened with weight gain and has not worsened recently. Tami Monroe admits to daytime somnolence and she admits to waking up still tired. Patient is at risk for obstructive sleep apnea. Patent has a history of symptoms of daytime Tami, morning Tami and hypertension. Patient generally gets 7 hours of sleep per night, and states  they generally have restless sleep. Snoring is present. Apneic episodes are not present. Epworth Sleepiness Score is 3  Tami Monroe notes increasing shortness of breath with certain activities (going up stairs) and seems to be worsening over time with weight gain. She notes getting out of breath sooner with activity than she used to. This has not gotten worse recently. Tami Monroe denies orthopnea.  Hypertension Tami Monroe is a 58 y.o. female with hypertension. She is taking HCTZ. Tami Monroe denies chest pain or shortness of breath on exertion. She is working weight loss to help control her blood pressure with the goal of decreasing her risk of heart attack and stroke. Tami Monroe blood pressure is currently controlled.  Hyperlipidemia Tami Monroe has hyperlipidemia (see last value) and is attempting to improve her cholesterol levels with intensive lifestyle modification including a low saturated fat diet, exercise and weight loss. She is not on medications. She denies myalgias.  At risk for cardiovascular disease Tami Monroe is at a higher than average risk for cardiovascular disease due to obesity, hypertension and hyperlipidemia. She currently denies any chest pain.  History of B12 deficiency Tami Monroe has a history of B12 insufficiency. Tami Monroe is not a vegetarian and does not have a previous diagnosis of pernicious anemia. She does not have a history of weight loss surgery.   Elevated Glucose Tami Monroe has a family history of diabetes type 2. She admits to polyuria.  Vitamin D deficiency Tami Monroe has a diagnosis of vitamin D deficiency. She is not currently taking vit D and denies nausea, vomiting or muscle weakness.  Depression with emotional eating behaviors  Tami Monroe is struggling with emotional eating and using food for comfort to the extent that it is negatively impacting her health. She often snacks when she is not hungry. Tami Monroe sometimes feels she is out of control and then feels guilty that she made  poor food choices. She is attempting to work on behavior modification techniques to help reduce her emotional eating She shows no sign of suicidal or homicidal ideations.  Depression Screen Tami Monroe's Food and Mood (modified PHQ-9) score was  Depression screen PHQ 2/9 04/02/2018  Decreased Interest 2  Down, Depressed, Hopeless 2  PHQ - 2 Score 4  Altered sleeping 2  Tired, decreased energy 2  Change in appetite 3  Feeling bad or failure about yourself  2  Trouble concentrating 1  Moving slowly or fidgety/restless 1  Suicidal thoughts 0  PHQ-9 Score 15  Difficult doing work/chores Somewhat difficult    ASSESSMENT AND PLAN:  Other Tami - Plan: EKG 12-Lead, Vitamin B12  SOB (shortness of breath) on exertion - Plan: Vitamin B12  Essential hypertension  B12 deficiency - Plan: Vitamin B12  Other hyperlipidemia  Elevated glucose - Plan: Hemoglobin A1c, Insulin, random  Vitamin D deficiency - Plan: VITAMIN D 25 Hydroxy (Vit-D Deficiency, Fractures)  Other depression  Depression screening - with emotional eating   At risk for heart disease  Class 1 obesity with serious comorbidity and body mass index (BMI) of 33.0 to 33.9 in adult, unspecified obesity type  PLAN:  Tami Monroe was informed that her Tami may be related to obesity, depression or many other causes. Labs will be ordered, and in the meanwhile Tami Monroe has agreed to work on diet, exercise and weight loss to help with Tami. Proper sleep hygiene was discussed including the need for 7-8 hours of quality sleep each night. A sleep study was not ordered based on symptoms and Epworth score.  Tami on exertion Tami Monroe's shortness of breath appears to be obesity related and exercise induced. She has agreed to work on weight loss and gradually increase exercise to treat her exercise induced shortness of breath. If Tami Monroe follows our instructions and loses weight without improvement of her shortness of breath, we will plan  to refer to pulmonology. We will monitor this condition regularly. Tami Monroe agrees to this plan.  Hypertension We discussed sodium restriction, working on healthy weight loss, and a regular exercise program as the means to achieve improved blood pressure control. Prairie agreed with this plan and agreed to follow up as directed. We will continue to monitor her blood pressure as well as her progress with the above lifestyle modifications. She will continue her medications as prescribed and will watch for signs of hypotension as she continues her lifestyle modifications.  Hyperlipidemia Steffani was informed of the American Heart Association Guidelines emphasizing intensive lifestyle modifications as the first line treatment for hyperlipidemia. She hopes to decrease with diet. We discussed many lifestyle modifications today in depth, and Louella will start to work on decreasing saturated fats such as fatty red meat, butter and many fried foods. She will also increase vegetables and lean protein in her diet and start to work on exercise and weight loss efforts.  Cardiovascular risk counseling Irelyn was given extended (15 minutes) coronary artery disease prevention counseling today. She is 58 y.o. female and has risk factors for heart disease including obesity, hypertension and hyperlipidemia. We discussed intensive lifestyle modifications today with an emphasis on specific weight loss instructions and strategies. Pt was also informed of the importance of  increasing exercise and decreasing saturated fats to help prevent heart disease.  History of B12 deficiency Caralee will work on increasing B12 rich foods in her diet. B12 supplementation was not prescribed today. We will check B12 level and she will follow up with our clinic in 2 weeks.  Elevated Glucose Fasting labs will be obtained (Hgb A1c, insulin level) and results with be discussed with [name] in 2 weeks at her follow up visit. In the meanwhile Ashe was  started on a lower simple carbohydrate diet and will work on weight loss efforts.  Vitamin D Deficiency Skyla was informed that low vitamin D levels contributes to Tami and are associated with obesity, breast, and colon cancer. She will follow up for routine testing of vitamin D, at least 2-3 times per year. We will check vitamin D level today and she will follow up with our clinic in 2 weeks.  Depression with Emotional Eating Behaviors We discussed behavior modification techniques today to help Tami Monroe deal with her emotional eating and depression. We will refer to Dr. Mallie Mussel our bariatric psychologist.  Depression Screen Tami Monroe had a strongly positive depression screening. Depression is commonly associated with obesity and often results in emotional eating behaviors. We will monitor this closely and work on CBT to help improve the non-hunger eating patterns.   Obesity Mayumi is currently in the action stage of change and her goal is to continue with weight loss efforts. I recommend Chaka begin the structured treatment plan as follows:  She has agreed to follow the Category 2 plan Misao has been instructed to eventually work up to a goal of 150 minutes of combined cardio and strengthening exercise per week for weight loss and overall health benefits. We discussed the following Behavioral Modification Strategies today: decreasing stress eating, increase H2O intake, keeping healthy foods in the home, increasing lean protein intake, decreasing simple carbohydrates, increasing vegetables and work on meal planning and easy cooking plans   She was informed of the importance of frequent follow up visits to maximize her success with intensive lifestyle modifications for her multiple health conditions. She was informed we would discuss her lab results at her next visit unless there is a critical issue that needs to be addressed sooner. Kaho agreed to keep her next visit at the agreed upon time to discuss  these results.  ALLERGIES: No Known Allergies  MEDICATIONS: Current Outpatient Medications on File Prior to Visit  Medication Sig Dispense Refill  . Cyanocobalamin (VITAMIN B12 PO) Take 1 tablet by mouth daily.    . hydrochlorothiazide (HYDRODIURIL) 25 MG tablet Take 1 tablet (25 mg total) by mouth daily as needed. 90 tablet 3  . ibuprofen (ADVIL,MOTRIN) 200 MG tablet Take 200 mg by mouth as needed.     No current facility-administered medications on file prior to visit.     PAST MEDICAL HISTORY: Past Medical History:  Diagnosis Date  . Allergy   . Constipation   . Hyperlipidemia   . Hypertension   . Joint pain     PAST SURGICAL HISTORY: Past Surgical History:  Procedure Laterality Date  . CHOLECYSTECTOMY  2011    SOCIAL HISTORY: Social History   Tobacco Use  . Smoking status: Never Smoker  . Smokeless tobacco: Never Used  Substance Use Topics  . Alcohol use: Yes    Comment: OCC  . Drug use: No    FAMILY HISTORY: Family History  Problem Relation Age of Onset  . Arthritis Mother   . Hyperlipidemia Mother   .  Heart disease Mother   . Hypertension Mother   . Diabetes Mother   . Bladder Cancer Mother 18  . Depression Mother   . Obesity Mother   . Lung cancer Father        smoker  . Alcohol abuse Father   . Obesity Father   . Cancer Other        Ureter cancer  . Diabetes Sister 18  . Vision loss Sister   . Diabetes Mellitus II Brother 72  . Crohn's disease Brother 31    ROS: Review of Systems  Constitutional: Positive for malaise/Tami.  Eyes:       + Vision Changes + Wear Glasses or Contacts + Floaters  Respiratory: Positive for shortness of breath (on exertion).   Cardiovascular: Negative for chest pain and orthopnea.       + Leg Cramping  Gastrointestinal: Negative for nausea and vomiting.  Genitourinary: Positive for frequency.  Musculoskeletal: Positive for joint pain. Negative for myalgias.       Negative for muscle weakness    Neurological: Positive for headaches.  Psychiatric/Behavioral: Positive for depression. Negative for suicidal ideas.    PHYSICAL EXAM: Blood pressure 128/83, pulse (!) 51, temperature 97.6 F (36.4 C), temperature source Oral, height 5\' 5"  (1.651 m), weight 198 lb (89.8 kg), SpO2 98 %. Body mass index is 32.95 kg/m. Physical Exam Vitals signs reviewed.  Constitutional:      Appearance: Normal appearance. She is well-developed. She is obese.  HENT:     Head: Normocephalic and atraumatic.     Nose: Nose normal.     Mouth/Throat:     Comments: Mallampati = 3 Eyes:     General: No scleral icterus.    Extraocular Movements: Extraocular movements intact.  Neck:     Musculoskeletal: Normal range of motion and neck supple.     Thyroid: No thyromegaly.  Cardiovascular:     Rate and Rhythm: Regular rhythm. Bradycardia present.  Pulmonary:     Effort: Pulmonary effort is normal. No respiratory distress.  Abdominal:     Palpations: Abdomen is soft.     Tenderness: There is no abdominal tenderness.  Musculoskeletal: Normal range of motion.  Skin:    General: Skin is warm and dry.  Neurological:     Mental Status: She is alert and oriented to person, place, and time.     Coordination: Coordination normal.  Psychiatric:        Mood and Affect: Mood normal.        Behavior: Behavior normal.        Thought Content: Thought content does not include homicidal or suicidal ideation.     RECENT LABS AND TESTS: BMET    Component Value Date/Time   NA 138 04/02/2018 1120   K 4.0 04/02/2018 1120   CL 97 04/02/2018 1120   CO2 23 04/02/2018 1120   GLUCOSE 88 04/02/2018 1120   GLUCOSE 93 01/04/2018 0908   BUN 15 04/02/2018 1120   CREATININE 0.99 04/02/2018 1120   CALCIUM 9.8 04/02/2018 1120   GFRNONAA 63 04/02/2018 1120   GFRAA 72 04/02/2018 1120   Lab Results  Component Value Date   HGBA1C 5.5 04/02/2018   Lab Results  Component Value Date   INSULIN 10.9 04/02/2018   CBC     Component Value Date/Time   WBC 5.2 01/04/2018 0908   RBC 4.61 01/04/2018 0908   HGB 14.4 01/04/2018 0908   HCT 41.7 01/04/2018 0908   PLT 242.0 01/04/2018 0908  MCV 90.4 01/04/2018 0908   MCHC 34.5 01/04/2018 0908   RDW 13.4 01/04/2018 0908   LYMPHSABS 2.0 01/04/2018 0908   MONOABS 0.4 01/04/2018 0908   EOSABS 0.1 01/04/2018 0908   BASOSABS 0.0 01/04/2018 0908   Iron/TIBC/Ferritin/ %Sat No results found for: IRON, TIBC, FERRITIN, IRONPCTSAT Lipid Panel     Component Value Date/Time   CHOL 240 (H) 01/04/2018 0908   TRIG 192.0 (H) 01/04/2018 0908   HDL 46.40 01/04/2018 0908   CHOLHDL 5 01/04/2018 0908   VLDL 38.4 01/04/2018 0908   LDLCALC 156 (H) 01/04/2018 0908   LDLDIRECT 155.0 05/05/2016 1632   Hepatic Function Panel     Component Value Date/Time   PROT 6.9 04/02/2018 1120   ALBUMIN 4.5 04/02/2018 1120   AST 35 04/02/2018 1120   ALT 36 (H) 04/02/2018 1120   ALKPHOS 117 04/02/2018 1120   BILITOT 0.7 04/02/2018 1120   BILIDIR 0.2 10/07/2014 1152      Component Value Date/Time   TSH 1.74 01/04/2018 0908   TSH 1.68 12/29/2016 1139   TSH 1.78 10/09/2015 0952    ECG  shows NSR with a rate of 53 BPM INDIRECT CALORIMETER done today shows a VO2 of 215 and a REE of 1496.  Her calculated basal metabolic rate is 4765 thus her basal metabolic rate is worse than expected.       OBESITY BEHAVIORAL INTERVENTION VISIT  Today's visit was # 1   Starting weight: 198 lbs Starting date: 04/02/2018 Today's weight : 198 lbs Today's date: 04/02/2018 Total lbs lost to date: 0   ASK: We discussed the diagnosis of obesity with Jetty Peeks today and Winnell agreed to give [place] permission to discuss obesity behavioral modification therapy today.  ASSESS: Sheryll has the diagnosis of obesity and her BMI today is 32.95 Marykate is in the action stage of change   ADVISE: Karagan was educated on the multiple health risks of obesity as well as the benefit of weight loss to improve her  health. She was advised of the need for long term treatment and the importance of lifestyle modifications to improve her current health and to decrease her risk of future health problems.  AGREE: Multiple dietary modification options and treatment options were discussed and  Martin agreed to follow the recommendations documented in the above note.  ARRANGE: Maiah was educated on the importance of frequent visits to treat obesity as outlined per CMS and USPSTF guidelines and agreed to schedule her next follow up appointment today.  Corey Skains, am acting as Location manager for [employer]. Owens Shark, DO  I have reviewed the above documentation for accuracy and completeness, and I agree with the above. -Jearld Lesch, DO

## 2018-04-04 DIAGNOSIS — I10 Essential (primary) hypertension: Secondary | ICD-10-CM | POA: Insufficient documentation

## 2018-04-04 HISTORY — DX: Essential (primary) hypertension: I10

## 2018-04-05 ENCOUNTER — Ambulatory Visit (INDEPENDENT_AMBULATORY_CARE_PROVIDER_SITE_OTHER): Payer: Self-pay | Admitting: Bariatrics

## 2018-04-19 ENCOUNTER — Ambulatory Visit (INDEPENDENT_AMBULATORY_CARE_PROVIDER_SITE_OTHER): Payer: BLUE CROSS/BLUE SHIELD | Admitting: Bariatrics

## 2018-04-19 ENCOUNTER — Encounter (INDEPENDENT_AMBULATORY_CARE_PROVIDER_SITE_OTHER): Payer: Self-pay | Admitting: Bariatrics

## 2018-04-19 ENCOUNTER — Ambulatory Visit (INDEPENDENT_AMBULATORY_CARE_PROVIDER_SITE_OTHER): Payer: BLUE CROSS/BLUE SHIELD | Admitting: Psychology

## 2018-04-19 VITALS — BP 118/75 | HR 72 | Temp 97.9°F | Ht 65.0 in | Wt 196.0 lb

## 2018-04-19 DIAGNOSIS — E559 Vitamin D deficiency, unspecified: Secondary | ICD-10-CM

## 2018-04-19 DIAGNOSIS — Z6832 Body mass index (BMI) 32.0-32.9, adult: Secondary | ICD-10-CM

## 2018-04-19 DIAGNOSIS — E8881 Metabolic syndrome: Secondary | ICD-10-CM

## 2018-04-19 DIAGNOSIS — F3289 Other specified depressive episodes: Secondary | ICD-10-CM

## 2018-04-19 DIAGNOSIS — I10 Essential (primary) hypertension: Secondary | ICD-10-CM

## 2018-04-19 DIAGNOSIS — E66811 Obesity, class 1: Secondary | ICD-10-CM

## 2018-04-19 DIAGNOSIS — Z9189 Other specified personal risk factors, not elsewhere classified: Secondary | ICD-10-CM | POA: Diagnosis not present

## 2018-04-19 DIAGNOSIS — E88819 Insulin resistance, unspecified: Secondary | ICD-10-CM

## 2018-04-19 DIAGNOSIS — E669 Obesity, unspecified: Secondary | ICD-10-CM

## 2018-04-19 MED ORDER — VITAMIN D (ERGOCALCIFEROL) 1.25 MG (50000 UNIT) PO CAPS: 50000.0000 [IU] | ORAL_CAPSULE | ORAL | 0 refills | Status: DC

## 2018-04-19 NOTE — Progress Notes (Addendum)
Office: [phone]  /  Fax: [phone]    Date: April 19, 2018  Time Seen: 2:52pm Duration: 46 minutes Provider: Glennie Isle, PsyD Type of Session: Intake for Individual Therapy  Type of Contact: Face-to-face  Informed Consent: The provider's role was explained to Jetty Peeks. The provider reviewed and discussed issues of confidentiality, privacy, and limits therein. In addition to verbal informed consent, written informed consent for psychological services was obtained from [place] prior to the initial intake interview. Written consent included information concerning the practice, financial arrangements, and confidentiality and patients' rights. Since the clinic is not a 24/7 crisis center, mental health emergency resources were shared in the form of a handout, and the provider explained MyChart, e-mail, voicemail, and/or other messaging systems should be utilized only for non-emergency reasons. Lilyann verbally acknowledged understanding of the aforementioned, and agreed to use mental health emergency resources discussed if needed. Moreover, Bethene agreed information may be shared with other [facility] providers as needed for coordination of care, and written consent was obtained.   Chief Complaint: Celines was referred by Dr. Jearld Lesch due to depression with emotional eating behaviors. Per the note for the visit with Dr. Jearld Lesch on April 02, 2018, "Emonni is struggling with emotional eating and using food for comfort to the extent that it is negatively impacting her health. She often snacks when she is not hungry. Georgiann sometimes feels she is out of control and then feels guilty that she made poor food choices. She is attempting to work on behavior modification techniques to help reduce her emotional eating She shows no sign of suicidal or homicidal ideations."  During today's appointment, Kurstyn described eating when she is stressed as well as eating for reward.  She added, "I enjoy food." The last episode of emotional eating was today due to work related stress resulting in her consuming a chicken sandwich and fries from [facility]. She indicated she tends to crave salty, crunchy, and sweet foods. Damonica also discussed engaging in all or nothing thinking.  She also endorsed experiencing mindless eating and engaging in grazing behaviors.  Becci was asked to complete a questionnaire assessing various behaviors related to emotional eating. Dennise endorsed the following: overeat when you are celebrating, experience food cravings on a regular basis, eat certain foods when you are anxious, stressed, depressed, or your feelings are hurt, use food to help you cope with emotional situations, find food is comforting to you, overeat when you are angry or upset, overeat frequently when you are bored or lonely and eat as a reward.  HPI:  Per the note for the initial visit with Dr. Jearld Lesch on April 02, 2018, Jodiann  started gaining weight in her 57s. During the initial appointment with Dr. Jearld Lesch, [name] reported experiencing the following: snacking frequently in the evenings, frequently making poor food choices, frequently eating larger portions than normal , binge eating behaviors, struggling with emotional eating and having problems with excessive hunger. In addition, when she snacks, it is on carbohydrates, and she frequently eats outside the home. During today's appointment, Rhylei reported a belief that the onset of emotional eating was during childhood. She denied a history of purging and engagement in other compensatory strategies and has never been diagnosed with an eating disorder.  Mental Status Examination: Aquila arrived early for the appointment. She presented as appropriately dressed and groomed. Guinevere appeared her stated age and demonstrated adequate orientation to time, place, person, and purpose of the appointment. She also  demonstrated appropriate eye contact.  No psychomotor abnormalities or behavioral peculiarities noted. Her mood was euthymic with congruent affect. Her thought processes were logical, linear, and goal-directed. No hallucinations, delusions, bizarre thinking or behavior reported or observed. Judgment, insight, and impulse control appeared to be grossly intact. There was no evidence of paraphasias (i.e., errors in speech, gross mispronunciations, and word substitutions), repetition deficits, or disturbances in volume or prosody (i.e., rhythm and intonation). There was no evidence of attention or memory impairments. Leshawn denied current suicidal and homicidal ideation, plan, and intent.   The Montreal Cognitive Assessment (MoCA) was administered. The MoCA assesses different cognitive domains: attention and concentration, executive functions, memory, language, visuoconstructional skills, conceptual thinking, calculations, and orientation. Mikela received 27 out of 30 points possible on the MoCA, which is noted in the normal range. One point was lost on the visuospatial/executive task requiring Dominik to duplicate a visual stimuli. One point was lost on the attention task involving serial 7s. Moreover, one point was lost on the delayed recall task as Maxene recalled four out of five words after a short delay. With a category cue, she could not recall the remaining word. However, with an additional multiple choice cue, Michaiah recalled the remaining word.   Family & Psychosocial History: Darnita shared she is currently not in a relationship and has never been married. She indicated she does not have children, but noted she has a dog. 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  Nikoletta stated her highest level of education is a bachelor of arts degree. She noted her social support system consists of her best friend, sister, and friends. She reported she is a member of an [employer] and attends church every Sunday.  Rainie also shared her brother and parents are deceased.  Medical History:  Past Medical History:  Diagnosis Date  . Allergy   . Constipation   . Hyperlipidemia   . Hypertension   . Joint pain    Past Surgical History:  Procedure Laterality Date  . CHOLECYSTECTOMY  2011   Current Outpatient Medications on File Prior to Visit  Medication Sig Dispense Refill  . Cyanocobalamin (VITAMIN B12 PO) Take 1 tablet by mouth daily.    . hydrochlorothiazide (HYDRODIURIL) 25 MG tablet Take 1 tablet (25 mg total) by mouth daily as needed. 90 tablet 3  . ibuprofen (ADVIL,MOTRIN) 200 MG tablet Take 200 mg by mouth as needed.     No current facility-administered medications on file prior to visit.   Gabryelle denied a history of head injuries and loss of consciousness.   Mental Health History: Oris reported receiving therapeutic services in the form of individual therapy for approximately two years in 1993 for depression. She denied ever meeting with a psychiatrist and has never been prescribed psychotropic medications. Juno also denied a history of hospitalizations for psychiatric concerns. She further denied a family history of mental health related concerns. Toy denied a trauma history, including psychological, physical  and sexual abuse, as well as neglect. However, she shared that approximately a year and a half ago while she was on a walk with her dog, they were attacked by two dogs. This has resulted in her no longer going for walks with her dog, and often being hypervigilant when she lets her dog out.   Festus Holts reported when she is at work, she is often stressed and frustrated due to the increase in responsibilities, which has resulted in her looking into retirement. Outside  of work, she described her mood as "good." She endorsed experiencing fatigue, overeating, decreased self-esteem due to weight, worry thoughts related to retirement, social withdrawal due to weight, and irritability. She denied  experiencing hopelessness, attention and concentration issues, memory concerns, feeling fidgety and restless, obsessions and compulsions, hallucinations and delusions, mania, and angry outbursts. She denied history of illicit and recreational substance use; however, she reported consuming alcohol "socially." Regarding the frequency of alcohol use, Mckynlee indicated it is typically in the form of 2 standard glasses of wine approximately once a month. She denied a history of legal involvement. She also denied history of and current suicidal ideation, plan, and intent; history of and current homicidal ideation, plan, and intent; and history of and current engagement in self-harm.  The following strengths were reported by [name]: leader, passionate, loyal, defender, and honest. The following strengths were observed by this provider: ability to express thoughts and feelings during the therapeutic session, ability to establish and benefit from a therapeutic relationship, ability to learn and practice coping skills, willingness to work toward established goal(s) with the clinic and ability to engage in reciprocal conversation.  Structured Assessment Results: The Patient Health Questionnaire-9 (PHQ-9) is a self-report measure that assesses symptoms and severity of depression over the course of the last two weeks. Nick obtained a score of 7 suggesting mild depression. Amee finds the endorsed symptoms to be not difficult at all. Depression screen PHQ 2/9 04/19/2018  Decreased Interest 1  Down, Depressed, Hopeless 1  PHQ - 2 Score 2  Altered sleeping 0  Tired, decreased energy 1  Change in appetite 2  Feeling bad or failure about yourself  2  Trouble concentrating 0  Moving slowly or fidgety/restless 0  Suicidal thoughts 0  PHQ-9 Score 7  Difficult doing work/chores -   The Generalized Anxiety Disorder-7 (GAD-7) is a brief self-report measure that assesses symptoms of anxiety over the course of the last two  weeks. Fannye obtained a score of 6 suggesting mild anxiety.  GAD 7 : Generalized Anxiety Score 04/19/2018  Nervous, Anxious, on Edge 1  Control/stop worrying 1  Worry too much - different things 1  Trouble relaxing 1  Restless 0  Easily annoyed or irritable 1  Afraid - awful might happen 1  Total GAD 7 Score 6  Anxiety Difficulty Not difficult at all   Interventions: A chart review was conducted prior to the clinical intake interview. The MoCA, PHQ-9, and GAD-7 were administered and a clinical intake interview was completed. In addition, Harlo was asked to complete a Mood and Food questionnaire to assess various behaviors related to emotional eating. Throughout session, empathic reflections and validation was provided. Continuing treatment with this provider was discussed and a treatment goal was established. Psychoeducation regarding emotional versus physical hunger was provided. Melva was given a handout to utilize between now and the next appointment to increase awareness of hunger patterns and subsequent eating. In addition, psychoeducation regarding all or nothing thinking was provided.  Provisional DSM-5 Diagnosis: 311 (F32.8) Other Specified Depressive Disorder, Emotional Eating Behaviors  Plan: Johnye appears able and willing to participate as evidenced by collaboration on a treatment goal, engagement in reciprocal conversation, and asking questions as needed for clarification. The next appointment will be scheduled in two weeks. The following treatment goal was established: decrease emotional eating. For the aforementioned goal, Annemarie can benefit from biweekly individual therapy sessions that are brief in duration for approximately four to six sessions. The treatment modality will be individual therapeutic  services, including an eclectic therapeutic approach utilizing techniques from Cognitive Behavioral Therapy, Patient Centered Therapy, Dialectical Behavior Therapy, Acceptance and Commitment  Therapy, Interpersonal Therapy, and Cognitive Restructuring. Therapeutic approach will include various interventions as appropriate, such as validation, support, mindfulness, thought defusion, reframing, psychoeducation, values assessment, and role playing. This provider will regularly review the treatment plan and medical chart to keep informed of status changes. Due to the incident that occurred with her dog [as noted above], Peityn expressed desire for a referral after working with this provider to reduce emotional eating. Layonna expressed understanding and agreement with the initial treatment plan of care.

## 2018-04-23 NOTE — Progress Notes (Signed)
Office: [phone]  /  Fax: [phone]   HPI:   Chief Complaint: OBESITY Julian is here to discuss her progress with her obesity treatment plan. She is on the Category 2 plan and is following her eating plan approximately 20 % of the time. She states she is exercising 0 minutes 0 times per week. Amarie has cut back on her snacking. Victoire is doing well with water. She thinks that the bread is too dry. Her weight is 196 lb (88.9 kg) today and has had a weight loss of 2 pounds over a period of 2 weeks since her last visit. She has lost 2 lbs since starting treatment with [place].  Vitamin D deficiency Eline has a diagnosis of vitamin D deficiency. Her last vitamin D level was at 15.9 She is not currently taking vit D and denies nausea, vomiting or muscle weakness.  At risk for osteopenia and osteoporosis Sunnie is at higher risk of osteopenia and osteoporosis due to vitamin D deficiency.   Insulin Resistance Toree has a diagnosis of insulin resistance based on her elevated fasting insulin level >5. Although Kandise's blood glucose readings are still under good control, insulin resistance puts her at greater risk of metabolic syndrome and diabetes. Her last A1c was at 5.5 and last insulin level was at 10.9 She is not taking medications currently and continues to work on diet and exercise to decrease risk of diabetes. Camay admits to slightly increased hunger.  Hypertension Bria Sparr is a 58 y.o. female with hypertension. She is currently taking HCTZ. Arturo Freundlich denies chest pain. She is working weight loss to help control her blood pressure with the goal of decreasing her risk of heart attack and stroke. Ellens blood pressure is currently controlled.   Depression with emotional eating behaviors Mc is to see Dr. Mallie Mussel our bariatric psychologist. She struggles with emotional eating and using food for comfort to the extent that it is negatively impacting her health. She often snacks when she is  not hungry. Jourden sometimes feels she is out of control and then feels guilty that she made poor food choices. She has been working on behavior modification techniques to help reduce her emotional eating and has been somewhat successful. She shows no sign of suicidal or homicidal ideations.  Depression screen [facility] 2/9 04/19/2018 04/02/2018 12/29/2016 10/09/2015  Decreased Interest 1 2 0 1  Down, Depressed, Hopeless 1 2 0 1  PHQ - 2 Score 2 4 0 2  Altered sleeping 0 2 - 0  Tired, decreased energy 1 2 - 2  Change in appetite 2 3 - 0  Feeling bad or failure about yourself  2 2 - 3  Trouble concentrating 0 1 - 0  Moving slowly or fidgety/restless 0 1 - 0  Suicidal thoughts 0 0 - 0  PHQ-9 Score 7 15 - 7  Difficult doing work/chores - Somewhat difficult - Not difficult at all        ASSESSMENT AND PLAN:  Vitamin D deficiency - Plan: Vitamin D, Ergocalciferol, (DRISDOL) 1.25 MG (50000 UT) CAPS capsule  Insulin resistance  Essential hypertension  Other depression - with emotional eating  At risk for osteoporosis  Class 1 obesity with serious comorbidity and body mass index (BMI) of 32.0 to 32.9 in adult, unspecified obesity type  PLAN:  Vitamin D Deficiency Zoelle was informed that low vitamin D levels contributes to fatigue and are associated with obesity, breast, and colon cancer. She agrees to start prescription Vit D @50 ,000  IU every week #4 with no refills and will follow up for routine testing of vitamin D, at least 2-3 times per year. She was informed of the risk of over-replacement of vitamin D and agrees to not increase her dose unless she discusses this with [place] first.  At risk for osteopenia and osteoporosis Mckenlee was given extended  (15 minutes) osteoporosis prevention counseling today. Kirat is at risk for osteopenia and osteoporosis due to her vitamin D deficiency. She was encouraged to take her vitamin D and follow her higher calcium diet and increase strengthening exercise  to help strengthen her bones and decrease her risk of osteopenia and osteoporosis.  Insulin Resistance Malayiah will continue to work on weight loss, exercise, increasing lean protein and decreasing simple carbohydrates in her diet to help decrease the risk of diabetes. She was informed that eating too many simple carbohydrates or too many calories at one sitting increases the likelihood of GI side effects. Abena agreed to follow up with [place] as directed to monitor her progress.  Hypertension We discussed sodium restriction, working on healthy weight loss, and a regular exercise program as the means to achieve improved blood pressure control. Abir agreed with this plan and agreed to follow up as directed. We will continue to monitor her blood pressure as well as her progress with the above lifestyle modifications. She will continue her medications as prescribed and will watch for signs of hypotension as she continues her lifestyle modifications.  Depression with Emotional Eating Behaviors We discussed behavior modification techniques today to help Arianah deal with her emotional eating and depression. She will see Dr. Mallie Mussel today and will follow up with our clinic at the agreed upon time.  Obesity Tariya is currently in the action stage of change. As such, her goal is to continue with weight loss efforts She has agreed to follow the Category 2 plan Atavia has been instructed to work up to a goal of 150 minutes of combined cardio and strengthening exercise per week for weight loss and overall health benefits. We discussed the following Behavioral Modification Strategies today: increase H2O intake, keeping healthy foods in the home, increasing lean protein intake, decreasing simple carbohydrates, increasing vegetables and work on meal planning and easy cooking plans We discussed breakfast options today.  Jadee has agreed to follow up with our clinic in 2 weeks. She was informed of the importance of  frequent follow up visits to maximize her success with intensive lifestyle modifications for her multiple health conditions.  ALLERGIES: No Known Allergies  MEDICATIONS: Current Outpatient Medications on File Prior to Visit  Medication Sig Dispense Refill  . Cyanocobalamin (VITAMIN B12 PO) Take 1 tablet by mouth daily.    . hydrochlorothiazide (HYDRODIURIL) 25 MG tablet Take 1 tablet (25 mg total) by mouth daily as needed. 90 tablet 3  . ibuprofen (ADVIL,MOTRIN) 200 MG tablet Take 200 mg by mouth as needed.     No current facility-administered medications on file prior to visit.     PAST MEDICAL HISTORY: Past Medical History:  Diagnosis Date  . Allergy   . Constipation   . Hyperlipidemia   . Hypertension   . Joint pain     PAST SURGICAL HISTORY: Past Surgical History:  Procedure Laterality Date  . CHOLECYSTECTOMY  2011    SOCIAL HISTORY: Social History   Tobacco Use  . Smoking status: Never Smoker  . Smokeless tobacco: Never Used  Substance Use Topics  . Alcohol use: Yes    Comment: OCC  .  Drug use: No    FAMILY HISTORY: Family History  Problem Relation Age of Onset  . Arthritis Mother   . Hyperlipidemia Mother   . Heart disease Mother   . Hypertension Mother   . Diabetes Mother   . Bladder Cancer Mother 58  . Depression Mother   . Obesity Mother   . Lung cancer Father        smoker  . Alcohol abuse Father   . Obesity Father   . Cancer Other        Ureter cancer  . Diabetes Sister 39  . Vision loss Sister   . Diabetes Mellitus II Brother 14  . Crohn's disease Brother 1    ROS: Review of Systems  Constitutional: Positive for weight loss.  Cardiovascular: Negative for chest pain.  Gastrointestinal: Negative for nausea and vomiting.  Musculoskeletal:       Negative for muscle weakness  Endo/Heme/Allergies:       Positive for polyphagia  Psychiatric/Behavioral: Positive for depression. Negative for suicidal ideas.    PHYSICAL EXAM: Blood  pressure 118/75, pulse 72, temperature 97.9 F (36.6 C), temperature source Oral, height 5\' 5"  (1.651 m), weight 196 lb (88.9 kg), SpO2 98 %. Body mass index is 32.62 kg/m. Physical Exam Vitals signs reviewed.  Constitutional:      Appearance: Normal appearance. She is well-developed. She is obese.  Cardiovascular:     Rate and Rhythm: Normal rate.  Pulmonary:     Effort: Pulmonary effort is normal.  Musculoskeletal: Normal range of motion.  Skin:    General: Skin is warm and dry.  Neurological:     Mental Status: She is alert and oriented to person, place, and time.  Psychiatric:        Mood and Affect: Mood normal.        Behavior: Behavior normal.        Thought Content: Thought content does not include homicidal or suicidal ideation.     RECENT LABS AND TESTS: BMET    Component Value Date/Time   NA 138 04/02/2018 1120   K 4.0 04/02/2018 1120   CL 97 04/02/2018 1120   CO2 23 04/02/2018 1120   GLUCOSE 88 04/02/2018 1120   GLUCOSE 93 01/04/2018 0908   BUN 15 04/02/2018 1120   CREATININE 0.99 04/02/2018 1120   CALCIUM 9.8 04/02/2018 1120   GFRNONAA 63 04/02/2018 1120   GFRAA 72 04/02/2018 1120   Lab Results  Component Value Date   HGBA1C 5.5 04/02/2018   Lab Results  Component Value Date   INSULIN 10.9 04/02/2018   CBC    Component Value Date/Time   WBC 5.2 01/04/2018 0908   RBC 4.61 01/04/2018 0908   HGB 14.4 01/04/2018 0908   HCT 41.7 01/04/2018 0908   PLT 242.0 01/04/2018 0908   MCV 90.4 01/04/2018 0908   MCHC 34.5 01/04/2018 0908   RDW 13.4 01/04/2018 0908   LYMPHSABS 2.0 01/04/2018 0908   MONOABS 0.4 01/04/2018 0908   EOSABS 0.1 01/04/2018 0908   BASOSABS 0.0 01/04/2018 0908   Iron/TIBC/Ferritin/ %Sat No results found for: IRON, TIBC, FERRITIN, IRONPCTSAT Lipid Panel     Component Value Date/Time   CHOL 240 (H) 01/04/2018 0908   TRIG 192.0 (H) 01/04/2018 0908   HDL 46.40 01/04/2018 0908   CHOLHDL 5 01/04/2018 0908   VLDL 38.4 01/04/2018  0908   LDLCALC 156 (H) 01/04/2018 0908   LDLDIRECT 155.0 05/05/2016 1632   Hepatic Function Panel     Component  Value Date/Time   PROT 6.9 04/02/2018 1120   ALBUMIN 4.5 04/02/2018 1120   AST 35 04/02/2018 1120   ALT 36 (H) 04/02/2018 1120   ALKPHOS 117 04/02/2018 1120   BILITOT 0.7 04/02/2018 1120   BILIDIR 0.2 10/07/2014 1152      Component Value Date/Time   TSH 1.74 01/04/2018 0908   TSH 1.68 12/29/2016 1139   TSH 1.78 10/09/2015 0952     Ref. Range 04/02/2018 11:20  Vitamin D, 25-Hydroxy Latest Ref Range: 30.0 - 100.0 ng/mL 15.9 (L)     OBESITY BEHAVIORAL INTERVENTION VISIT  Today's visit was # 2   Starting weight: 198 lbs Starting date: 04/02/2018 Today's weight : 196 lbs Today's date: 04/19/2018 Total lbs lost to date: 2   ASK: We discussed the diagnosis of obesity with Jetty Peeks today and Lequisha agreed to give [place] permission to discuss obesity behavioral modification therapy today.  ASSESS: Heath has the diagnosis of obesity and her BMI today is 32.62 Cataleya is in the action stage of change   ADVISE: Carolann was educated on the multiple health risks of obesity as well as the benefit of weight loss to improve her health. She was advised of the need for long term treatment and the importance of lifestyle modifications to improve her current health and to decrease her risk of future health problems.  AGREE: Multiple dietary modification options and treatment options were discussed and  Ghazal agreed to follow the recommendations documented in the above note.  ARRANGE: Aliviah was educated on the importance of frequent visits to treat obesity as outlined per CMS and USPSTF guidelines and agreed to schedule her next follow up appointment today.  Corey Skains, am acting as Location manager for [employer]. Owens Shark, DO  I have reviewed the above documentation for accuracy and completeness, and I agree with the above. -Jearld Lesch, DO

## 2018-05-14 ENCOUNTER — Encounter (INDEPENDENT_AMBULATORY_CARE_PROVIDER_SITE_OTHER): Payer: Self-pay | Admitting: Bariatrics

## 2018-05-14 ENCOUNTER — Other Ambulatory Visit (INDEPENDENT_AMBULATORY_CARE_PROVIDER_SITE_OTHER): Payer: Self-pay | Admitting: Bariatrics

## 2018-05-14 ENCOUNTER — Ambulatory Visit (INDEPENDENT_AMBULATORY_CARE_PROVIDER_SITE_OTHER): Payer: BLUE CROSS/BLUE SHIELD | Admitting: Bariatrics

## 2018-05-14 ENCOUNTER — Ambulatory Visit (INDEPENDENT_AMBULATORY_CARE_PROVIDER_SITE_OTHER): Payer: BLUE CROSS/BLUE SHIELD | Admitting: Psychology

## 2018-05-14 VITALS — BP 110/69 | HR 59 | Temp 97.9°F | Ht 65.0 in | Wt 196.0 lb

## 2018-05-14 DIAGNOSIS — F3289 Other specified depressive episodes: Secondary | ICD-10-CM | POA: Diagnosis not present

## 2018-05-14 DIAGNOSIS — I10 Essential (primary) hypertension: Secondary | ICD-10-CM

## 2018-05-14 DIAGNOSIS — Z6832 Body mass index (BMI) 32.0-32.9, adult: Secondary | ICD-10-CM

## 2018-05-14 DIAGNOSIS — E559 Vitamin D deficiency, unspecified: Secondary | ICD-10-CM

## 2018-05-14 DIAGNOSIS — E669 Obesity, unspecified: Secondary | ICD-10-CM

## 2018-05-14 DIAGNOSIS — Z9189 Other specified personal risk factors, not elsewhere classified: Secondary | ICD-10-CM

## 2018-05-14 MED ORDER — VITAMIN D (ERGOCALCIFEROL) 1.25 MG (50000 UNIT) PO CAPS: 50000.0000 [IU] | ORAL_CAPSULE | ORAL | 0 refills | Status: DC

## 2018-05-14 NOTE — Progress Notes (Signed)
Office: [phone]  /  Fax: [phone]   HPI:   Chief Complaint: OBESITY Tami Monroe is here to discuss her progress with her obesity treatment plan. She is on the Category 2 plan and is following her eating plan approximately 75 % of the time. She states she is exercising 0 minutes 0 times per week. Tami Monroe has been doing well. She states that she has been to [place] for a business trip  and got off schedule. Her weight is 196 lb (88.9 kg) today and she has maintained weight over a period of 4 weeks since her last visit. She has lost 2 lbs since starting treatment with [place].  Vitamin D deficiency Tami Monroe has a diagnosis of vitamin D deficiency. She is currently taking high dose vit D and denies nausea, vomiting or muscle weakness.  At risk for osteopenia and osteoporosis Tami Monroe is at higher risk of osteopenia and osteoporosis due to vitamin D deficiency.   Hypertension Tami Monroe is a 58 y.o. female with hypertension. She is currently taking HCTZ. Tami Monroe denies chest pain. She is working weight loss to help control her blood pressure with the goal of decreasing her risk of heart attack and stroke. Tami Monroe blood pressure is well controlled.  Depression with emotional eating behaviors Tami Monroe is struggling with emotional eating and using food for comfort to the extent that it is negatively impacting her health. She often snacks when she is not hungry. Tami Monroe sometimes feels she is out of control and then feels guilty that she made poor food choices. She has been working on behavior modification techniques to help reduce her emotional eating and has been somewhat successful. She shows no sign of suicidal or homicidal ideations.  Depression screen Tami Monroe 2/9 05/14/2018 04/19/2018 04/02/2018 12/29/2016 10/09/2015  Decreased Interest 0 1 2 0 1  Down, Depressed, Hopeless 0 1 2 0 1  PHQ - 2 Score 0 2 4 0 2  Altered sleeping 1 0 2 - 0  Tired, decreased energy 0 1 2 - 2  Change in appetite 1 2 3  - 0  Feeling  bad or failure about yourself  0 2 2 - 3  Trouble concentrating 0 0 1 - 0  Moving slowly or fidgety/restless 0 0 1 - 0  Suicidal thoughts 0 0 0 - 0  PHQ-9 Score 2 7 15  - 7  Difficult doing work/chores - - Somewhat difficult - Not difficult at all     ASSESSMENT AND PLAN:  Vitamin D deficiency - Plan: Vitamin D, Ergocalciferol, (DRISDOL) 1.25 MG (50000 UT) CAPS capsule  Essential hypertension  Other depression - with emotional eating  At risk for osteoporosis  Class 1 obesity with serious comorbidity and body mass index (BMI) of 32.0 to 32.9 in adult, unspecified obesity type  PLAN:  Vitamin D Deficiency Tami Monroe was informed that low vitamin D levels contributes to fatigue and are associated with obesity, breast, and colon cancer. She agrees to continue to take prescription Vit D @50 ,000 IU every week #4 with no refills and will follow up for routine testing of vitamin D, at least 2-3 times per year. She was informed of the risk of over-replacement of vitamin D and agrees to not increase her dose unless she discusses this with [place] first. Tami Monroe agrees to follow up as directed.  At risk for osteopenia and osteoporosis Tami Monroe was given extended  (15 minutes) osteoporosis prevention counseling today. Tami Monroe is at risk for osteopenia and osteoporosis due to her vitamin D deficiency. She  was encouraged to take her vitamin D and follow her higher calcium diet and increase strengthening exercise to help strengthen her bones and decrease her risk of osteopenia and osteoporosis.  Hypertension We discussed sodium restriction, working on healthy weight loss, and a regular exercise program as the means to achieve improved blood pressure control. Tasheba agreed with this plan and agreed to follow up as directed. We will continue to monitor her blood pressure as well as her progress with the above lifestyle modifications. She will continue her medications as prescribed and will watch for signs of hypotension  as she continues her lifestyle modifications.  Depression with Emotional Eating Behaviors We discussed behavior modification techniques today to help Tami Monroe deal with her emotional eating and depression. She will continue to use cognitive behavioral therapy techniques and follow up as directed.  Obesity Tami Monroe is currently in the action stage of change. As such, her goal is to continue with weight loss efforts She has agreed to follow the Category 2 plan Tami Monroe has been instructed to work up to a goal of 150 minutes of combined cardio and strengthening exercise per week for weight loss and overall health benefits. We discussed the following Behavioral Modification Strategies today: increase H2O intake, no skipping meals, keeping healthy foods in the home, increasing lean protein intake, decreasing simple carbohydrates, increasing vegetables, decrease eating out and work on meal planning and easy cooking plans  Tami Monroe has agreed to follow up with our clinic in 2 weeks. She was informed of the importance of frequent follow up visits to maximize her success with intensive lifestyle modifications for her multiple health conditions.  ALLERGIES: No Known Allergies  MEDICATIONS: Current Outpatient Medications on File Prior to Visit  Medication Sig Dispense Refill  . Cyanocobalamin (VITAMIN B12 PO) Take 1 tablet by mouth daily.    . hydrochlorothiazide (HYDRODIURIL) 25 MG tablet Take 1 tablet (25 mg total) by mouth daily as needed. 90 tablet 3  . ibuprofen (ADVIL,MOTRIN) 200 MG tablet Take 200 mg by mouth as needed.     No current facility-administered medications on file prior to visit.     PAST MEDICAL HISTORY: Past Medical History:  Diagnosis Date  . Allergy   . Constipation   . Hyperlipidemia   . Hypertension   . Joint pain     PAST SURGICAL HISTORY: Past Surgical History:  Procedure Laterality Date  . CHOLECYSTECTOMY  2011    SOCIAL HISTORY: Social History   Tobacco Use  .  Smoking status: Never Smoker  . Smokeless tobacco: Never Used  Substance Use Topics  . Alcohol use: Yes    Comment: OCC  . Drug use: No    FAMILY HISTORY: Family History  Problem Relation Age of Onset  . Arthritis Mother   . Hyperlipidemia Mother   . Heart disease Mother   . Hypertension Mother   . Diabetes Mother   . Bladder Cancer Mother 5  . Depression Mother   . Obesity Mother   . Lung cancer Father        smoker  . Alcohol abuse Father   . Obesity Father   . Cancer Other        Ureter cancer  . Diabetes Sister 46  . Vision loss Sister   . Diabetes Mellitus II Brother 36  . Crohn's disease Brother 54    ROS: Review of Systems  Constitutional: Negative for weight loss.  Cardiovascular: Negative for chest pain.  Gastrointestinal: Negative for nausea and vomiting.  Musculoskeletal:  Negative for muscle weakness  Psychiatric/Behavioral: Positive for depression. Negative for suicidal ideas.    PHYSICAL EXAM: Blood pressure 110/69, pulse (!) 59, temperature 97.9 F (36.6 C), temperature source Oral, height 5\' 5"  (1.651 m), weight 196 lb (88.9 kg), SpO2 96 %. Body mass index is 32.62 kg/m. Physical Exam Vitals signs reviewed.  Constitutional:      Appearance: Normal appearance. She is well-developed. She is obese.  Cardiovascular:     Rate and Rhythm: Normal rate.  Pulmonary:     Effort: Pulmonary effort is normal.  Musculoskeletal: Normal range of motion.  Skin:    General: Skin is warm and dry.  Neurological:     Mental Status: She is alert and oriented to person, place, and time.  Psychiatric:        Mood and Affect: Mood normal.        Behavior: Behavior normal.        Thought Content: Thought content does not include homicidal or suicidal ideation.     RECENT LABS AND TESTS: BMET    Component Value Date/Time   NA 138 04/02/2018 1120   K 4.0 04/02/2018 1120   CL 97 04/02/2018 1120   CO2 23 04/02/2018 1120   GLUCOSE 88 04/02/2018 1120    GLUCOSE 93 01/04/2018 0908   BUN 15 04/02/2018 1120   CREATININE 0.99 04/02/2018 1120   CALCIUM 9.8 04/02/2018 1120   GFRNONAA 63 04/02/2018 1120   GFRAA 72 04/02/2018 1120   Lab Results  Component Value Date   HGBA1C 5.5 04/02/2018   Lab Results  Component Value Date   INSULIN 10.9 04/02/2018   CBC    Component Value Date/Time   WBC 5.2 01/04/2018 0908   RBC 4.61 01/04/2018 0908   HGB 14.4 01/04/2018 0908   HCT 41.7 01/04/2018 0908   PLT 242.0 01/04/2018 0908   MCV 90.4 01/04/2018 0908   MCHC 34.5 01/04/2018 0908   RDW 13.4 01/04/2018 0908   LYMPHSABS 2.0 01/04/2018 0908   MONOABS 0.4 01/04/2018 0908   EOSABS 0.1 01/04/2018 0908   BASOSABS 0.0 01/04/2018 0908   Iron/TIBC/Ferritin/ %Sat No results found for: IRON, TIBC, FERRITIN, IRONPCTSAT Lipid Panel     Component Value Date/Time   CHOL 240 (H) 01/04/2018 0908   TRIG 192.0 (H) 01/04/2018 0908   HDL 46.40 01/04/2018 0908   CHOLHDL 5 01/04/2018 0908   VLDL 38.4 01/04/2018 0908   LDLCALC 156 (H) 01/04/2018 0908   LDLDIRECT 155.0 05/05/2016 1632   Hepatic Function Panel     Component Value Date/Time   PROT 6.9 04/02/2018 1120   ALBUMIN 4.5 04/02/2018 1120   AST 35 04/02/2018 1120   ALT 36 (H) 04/02/2018 1120   ALKPHOS 117 04/02/2018 1120   BILITOT 0.7 04/02/2018 1120   BILIDIR 0.2 10/07/2014 1152      Component Value Date/Time   TSH 1.74 01/04/2018 0908   TSH 1.68 12/29/2016 1139   TSH 1.78 10/09/2015 0952     Ref. Range 04/02/2018 11:20  Vitamin D, 25-Hydroxy Latest Ref Range: 30.0 - 100.0 ng/mL 15.9 (L)    OBESITY BEHAVIORAL INTERVENTION VISIT  Today's visit was # 3   Starting weight: 198 lbs Starting date: 04/02/2018 Today's weight : 196 lbs Today's date: 05/14/2018 Total lbs lost to date: 2    05/14/2018  Height 5\' 5"  (1.651 m)  Weight 196 lb (88.9 kg)  BMI (Calculated) 32.62  BLOOD PRESSURE - SYSTOLIC 053  BLOOD PRESSURE - DIASTOLIC 69   Body Fat %  40.4 %  Total Body Water (lbs) 78.6 lbs     ASK: We discussed the diagnosis of obesity with Tami Monroe today and Tami Monroe agreed to give [place] permission to discuss obesity behavioral modification therapy today.  ASSESS: Tami Monroe has the diagnosis of obesity and her BMI today is 32.62 Tami Monroe is in the action stage of change   ADVISE: Tami Monroe was educated on the multiple health risks of obesity as well as the benefit of weight loss to improve her health. She was advised of the need for long term treatment and the importance of lifestyle modifications to improve her current health and to decrease her risk of future health problems.  AGREE: Multiple dietary modification options and treatment options were discussed and  Lashann agreed to follow the recommendations documented in the above note.  ARRANGE: Tami Monroe was educated on the importance of frequent visits to treat obesity as outlined per CMS and USPSTF guidelines and agreed to schedule her next follow up appointment today.  Corey Skains, am acting as Location manager for [employer]. Owens Shark, DO  I have reviewed the above documentation for accuracy and completeness, and I agree with the above. -Jearld Lesch, DO

## 2018-05-14 NOTE — Progress Notes (Signed)
Office: [phone]  /  Fax: [phone]    Date: May 14, 2018   Time Seen: 4:34pm Duration: 30 minutes Provider: Glennie Isle, Psy.D. Type of Session: Individual Therapy  Type of Contact: Face-to-face  Session Content: Tami Monroe is a 58 y.o. female presenting for a follow-up appointment to address the previously established treatment goal of decreasing emotional eating. The session was initiated with the administration of the PHQ-9 and GAD-7, as well as a brief check-in. Tami Monroe shared she recently traveled to [place] for work. She noted following the meal plan prior to her trip, but discussed not following the meal plan while in [place]. She believes the aforementioned is what resulted in her not gaining nor losing weight. Moreover, Tami Monroe shared her sister and husband may be moving in with her, and discussed ongoing stressors with work. Psychoeducation regarding triggers for emotional eating was provided. Tami Monroe was provided a handout, and encouraged to utilize the handout between now and the next appointment to increase awareness of triggers and frequency. Tami Monroe agreed. This provider also discussed behavioral strategies for specific triggers, such as placing the utensil down when conversing to avoid mindless eating. Tami Monroe shared, "Paying attention to the triggers in the moment" stood out to her during today's appointment. Overall, Tami Monroe was receptive to today's session as evidenced by openness to sharing, responsiveness to feedback, and willingness to identify triggers for emotional eating.   Mental Status Examination: Tami Monroe arrived on time for the appointment. She presented as appropriately dressed and groomed. Tami Monroe appeared her stated age and demonstrated adequate orientation to time, place, person, and purpose of the appointment. She also demonstrated appropriate eye contact. No psychomotor abnormalities or behavioral peculiarities noted. Her mood was euthymic with congruent affect. Her thought  processes were logical, linear, and goal-directed. No hallucinations, delusions, bizarre thinking or behavior reported or observed. Judgment, insight, and impulse control appeared to be grossly intact. There was no evidence of paraphasias (i.e., errors in speech, gross mispronunciations, and word substitutions), repetition deficits, or disturbances in volume or prosody (i.e., rhythm and intonation). There was no evidence of attention or memory impairments. Tami Monroe denied current suicidal and homicidal ideation, plan and intent.   Structured Assessment Results: The Patient Health Questionnaire-9 (PHQ-9) is a self-report measure that assesses symptoms and severity of depression over the course of the last two weeks. Tami Monroe obtained a score of 2 suggesting minimal depression. Tami Monroe finds the endorsed symptoms to be not difficult at all. Depression screen [facility] 2/9 05/14/2018  Decreased Interest 0  Down, Depressed, Hopeless 0  PHQ - 2 Score 0  Altered sleeping 1  Tired, decreased energy 0  Change in appetite 1  Feeling bad or failure about yourself  0  Trouble concentrating 0  Moving slowly or fidgety/restless 0  Suicidal thoughts 0  PHQ-9 Score 2  Difficult doing work/chores -   The Generalized Anxiety Disorder-7 (GAD-7) is a brief self-report measure that assesses symptoms of anxiety over the course of the last two weeks. Tami Monroe obtained a score of 4 suggesting minimal anxiety. GAD 7 : Generalized Anxiety Score 05/14/2018  Nervous, Anxious, on Edge 1  Control/stop worrying 1  Worry too much - different things 1  Trouble relaxing 1  Restless 0  Easily annoyed or irritable 0  Afraid - awful might happen 0  Total GAD 7 Score 4  Anxiety Difficulty Not difficult at all   Interventions:  Administration of PHQ-9 and GAD-7 for symptom monitoring Review of content from the previous session Empathic reflections and validation Psychoeducation  regarding triggers for emotional eating Positive  reinforcement Rapport building Brief chart review  DSM-5 Diagnosis: 311 (F32.8) Other Specified Depressive Disorder, Emotional Eating Behaviors  Treatment Goal & Progress: During the initial appointment with this provider, the following treatment goal was established: decrease emotional eating. Progress is limited, as Tami Monroe has just begun treatment with this provider; however, she is receptive to the interaction and interventions and rapport is being established. Nevertheless, Tami Monroe has demonstrated progress in her goal as evidenced by increased awareness of hunger patterns  Plan: Tami Monroe continues to appear able and willing to participate as evidenced by engagement in reciprocal conversation, and asking questions for clarification as appropriate. The next appointment will be scheduled in two weeks. The next session will focus on reviewing triggers for emotional eating, and working towards the established treatment goal.

## 2018-05-15 DIAGNOSIS — E559 Vitamin D deficiency, unspecified: Secondary | ICD-10-CM | POA: Insufficient documentation

## 2018-05-15 HISTORY — DX: Vitamin D deficiency, unspecified: E55.9

## 2018-05-28 NOTE — Progress Notes (Signed)
Office: [phone]  /  Fax: [phone]    Date: 05/31/2018  Time Seen: 11:37am Duration: 25 minutes Provider: Glennie Isle, Psy.D. Type of Session: Individual Therapy  Type of Contact: Face-to-face  Session Content: Tami Monroe is a 58 y.o. female presenting for a follow-up appointment to address the previously established treatment goal of decreasing emotional eating. The session was initiated with the administration of the PHQ-9 and GAD-7, as well as a brief check-in. Tami Monroe shared experiencing frustration related to the coronavirus, and eating habits; however, she shared she lost 4 pounds. She acknowledged eating out and making poor choices. Moreover, Tami Monroe stated she experiences urges. This was explored. She explained, "If it is a frustrating day, I want to comfort myself." She discussed eating crunchy foods and chocolate and subsequent deviations from the structured meal plan. Thus, psychoeducation regarding all-or-nothing thinking was provided. Additionally, this provider explored obstacles and barriers that may impact Tami Monroe's ability to eat congruent to the structured meal plan moving forward. She shared it has been a challenge to obtain protein due to the coronavirus. As such, it was recommended she MyChart message the clinic's dieticians should she continue to have difficulty obtaining protein and/or other foods on the structured meal plan to determine alternatives; Tami Monroe agreed. Regarding emotional eating, Tami Monroe shared thinking about the coronavirus, isolation with church, and work have all triggered emotional eating. Due to recent difficulties following the structured meal plan and episodes of emotional eating, psychoeducation regarding the hunger and satisfaction scale was provided, and a handout was given to Tami Monroe. This provider and Tami Monroe discussed various scenarios during which she can utilize the scale to assist in making better choices, avoid overeating/overindulging, and eating at  appropriate times when experiencing physical hunger. Furthermore, due to uncertainty related to the coronavirus, this provider and Tami Monroe discussed referral options for providers offering teletherapy as well as the utilization of emergency resources if this provider's clinic was to close. A handout was provided. Tami Monroe expressed understanding, and noted she would use the provided resources if necessary. Tami Monroe was receptive to today's session as evidenced by openness to sharing, responsiveness to feedback, and willingness to utilize the hunger and satisfaction scale.  Mental Status Examination: Tami Monroe arrived early for the appointment. She presented as appropriately dressed and groomed. Tami Monroe appeared her stated age and demonstrated adequate orientation to time, place, person, and purpose of the appointment. She also demonstrated appropriate eye contact. No psychomotor abnormalities or behavioral peculiarities noted. Her mood was euthymic with congruent affect. Her thought processes were logical, linear, and goal-directed. No hallucinations, delusions, bizarre thinking or behavior reported or observed. Judgment, insight, and impulse control appeared to be grossly intact. There was no evidence of paraphasias (i.e., errors in speech, gross mispronunciations, and word substitutions), repetition deficits, or disturbances in volume or prosody (i.e., rhythm and intonation). There was no evidence of attention or memory impairments. Tami Monroe denied current suicidal and homicidal ideation, plan and intent.   Structured Assessment Results: The Patient Health Questionnaire-9 (PHQ-9) is a self-report measure that assesses symptoms and severity of depression over the course of the last two weeks. Tami Monroe obtained a score of 5 suggesting mild depression. Tami Monroe finds the endorsed symptoms to be not difficult at all. Depression screen [facility] 2/9 05/31/2018  Decreased Interest 0  Down, Depressed, Hopeless 1  PHQ - 2 Score 1  Altered  sleeping 0  Tired, decreased energy 1  Change in appetite 2  Feeling bad or failure about yourself  1  Trouble concentrating 0  Moving slowly or  fidgety/restless 0  Suicidal thoughts 0  PHQ-9 Score 5  Difficult doing work/chores -   The Generalized Anxiety Disorder-7 (GAD-7) is a brief self-report measure that assesses symptoms of anxiety over the course of the last two weeks. Tami Monroe obtained a score of 6 suggesting mild anxiety. GAD 7 : Generalized Anxiety Score 05/31/2018  Nervous, Anxious, on Edge 1  Control/stop worrying 1  Worry too much - different things 1  Trouble relaxing 1  Restless 0  Easily annoyed or irritable 1  Afraid - awful might happen 1  Total GAD 7 Score 6  Anxiety Difficulty Not difficult at all   Interventions:  Administration of PHQ-9 and GAD-7 for symptom monitoring Review of content from the previous session Empathic reflections and validation Psychoeducation regarding all-or-nothing thinking was provided Explored barriers/obstacles that may impact ability follow structured meal plan Positive reinforcement Psychoeducation regarding the hunger and satisfaction scale Brief chart review  DSM-5 Diagnosis: 311 (F32.8) Other Specified Depressive Disorder, Emotional Eating Behaviors  Treatment Goal & Progress: During the initial appointment with this provider, the following treatment goal was established: decrease emotional eating. Tami Monroe has demonstrated progress in her goal as evidenced by increased awareness of hunger patterns and triggers for emotional eating. She also demonstrates willingness to utilize the hunger and satisfaction scales in the coming weeks.   Plan: Tami Monroe continues to appear able and willing to participate as evidenced by engagement in reciprocal conversation, and asking questions for clarification as appropriate. The next appointment will be scheduled in two weeks. The next session will focus on the introduction of mindfulness.

## 2018-05-31 ENCOUNTER — Ambulatory Visit (INDEPENDENT_AMBULATORY_CARE_PROVIDER_SITE_OTHER): Payer: BLUE CROSS/BLUE SHIELD | Admitting: Psychology

## 2018-05-31 ENCOUNTER — Encounter (INDEPENDENT_AMBULATORY_CARE_PROVIDER_SITE_OTHER): Payer: Self-pay | Admitting: Bariatrics

## 2018-05-31 ENCOUNTER — Ambulatory Visit (INDEPENDENT_AMBULATORY_CARE_PROVIDER_SITE_OTHER): Payer: BLUE CROSS/BLUE SHIELD | Admitting: Bariatrics

## 2018-05-31 ENCOUNTER — Other Ambulatory Visit: Payer: Self-pay

## 2018-05-31 VITALS — BP 118/70 | HR 55 | Temp 98.1°F | Ht 65.0 in | Wt 192.0 lb

## 2018-05-31 DIAGNOSIS — E6609 Other obesity due to excess calories: Secondary | ICD-10-CM

## 2018-05-31 DIAGNOSIS — F3289 Other specified depressive episodes: Secondary | ICD-10-CM

## 2018-05-31 DIAGNOSIS — Z6831 Body mass index (BMI) 31.0-31.9, adult: Secondary | ICD-10-CM

## 2018-05-31 DIAGNOSIS — I10 Essential (primary) hypertension: Secondary | ICD-10-CM

## 2018-05-31 DIAGNOSIS — Z711 Person with feared health complaint in whom no diagnosis is made: Secondary | ICD-10-CM

## 2018-05-31 NOTE — Progress Notes (Signed)
Office: [phone]  /  Fax: [phone]   HPI:   Chief Complaint: OBESITY Tami Monroe is here to discuss her progress with her obesity treatment plan. She is on the Category 2 plan and is following her eating plan approximately 20 % of the time. She states she is exercising 0 minutes 0 times per week. Tami Monroe is doing well overall. She has been stressed, secondary to the national stress. Tami Monroe wants chips and pizza. Her weight is 192 lb (87.1 kg) today and has had a weight loss of 4 pounds over a period of 2 weeks since her last visit. She has lost 6 lbs since starting treatment with Tami Monroe.  Hypertension Tami Monroe is a 58 y.o. female with hypertension. She is taking HCTZ. Tami Monroe denies lightheadedness. She is working weight loss to help control her blood pressure with the goal of decreasing her risk of heart attack and stroke. Tami Monroe blood pressure is well controlled.  Worried Well ( Covid 12 ) Tami Monroe is concerned with not getting the foods that she needs and she has some mild emotional eating.  Depression with emotional eating behaviors Tami Monroe is struggling with emotional eating and using food for comfort to the extent that it is negatively impacting her health. She often snacks when she is not hungry. Tami Monroe sometimes feels she is out of control and then feels guilty that she made poor food choices. She has been working on behavior modification techniques to help reduce her emotional eating and has been somewhat successful. She shows no sign of suicidal or homicidal ideations.  Depression screen Tami Monroe 2/9 05/31/2018 05/14/2018 04/19/2018 04/02/2018 12/29/2016  Decreased Interest 0 0 1 2 0  Down, Depressed, Hopeless 1 0 1 2 0  PHQ - 2 Score 1 0 2 4 0  Altered sleeping 0 1 0 2 -  Tired, decreased energy 1 0 1 2 -  Change in appetite 2 1 2 3  -  Feeling bad or failure about yourself  1 0 2 2 -  Trouble concentrating 0 0 0 1 -  Moving slowly or fidgety/restless 0 0 0 1 -  Suicidal thoughts 0 0 0 0  -  PHQ-9 Score 5 2 7 15  -  Difficult doing work/chores - - - Somewhat difficult -    ASSESSMENT AND PLAN:  Essential hypertension  Worried well  Other depression - with emotional eating  Class 1 obesity with serious comorbidity and body mass index (BMI) of 32.0 to 32.9 in adult, unspecified obesity type  PLAN:  Hypertension We discussed sodium restriction, working on healthy weight loss, and a regular exercise program as the means to achieve improved blood pressure control. Tami Monroe agreed with this plan and agreed to follow up as directed. We will continue to monitor her blood pressure as well as her progress with the above lifestyle modifications. She will continue her medications as prescribed and will watch for signs of hypotension as she continues her lifestyle modifications.  Worried Well ( Covid 61 ) Pt was educated on the need for social distancing, no travel and hand washing/hand sanitizing. We discussed symptoms of COVID19 and we discussed the possible need for sequestration and how to deal with this if it happens. CBT techniques were discussed today. Patient offered guidance and reassurance.  Depression with Emotional Eating Behaviors We discussed behavior modification techniques today to help Tami Monroe deal with her emotional eating and depression. She will follow up with Dr. Mallie Monroe our bariatric psychologist and she will increase walking.  I spent >  than 50% of the 15 minute visit on counseling as documented in the note.  Obesity Tami Monroe is currently in the action stage of change. As such, her goal is to continue with weight loss efforts She has agreed to follow the Category 2 plan Tami Monroe has been instructed to increase walking to 3 times per week for weight loss and overall health benefits. We discussed the following Behavioral Modification Strategies today: increase H2O intake, no skipping meals, keeping healthy foods in the home, better snacking choices, increasing lean protein  intake, decreasing simple carbohydrates, increasing vegetables, decrease eating out, work on meal planning and easy cooking plans and emotional eating strategies Making smart fruit choices, 100 calorie snacks and snack ideas handouts were given to patient today.  Tami Monroe has agreed to follow up with our clinic in 2 weeks. She was informed of the importance of frequent follow up visits to maximize her success with intensive lifestyle modifications for her multiple health conditions.  ALLERGIES: No Known Allergies  MEDICATIONS: Current Outpatient Medications on File Prior to Visit  Medication Sig Dispense Refill  . Cyanocobalamin (VITAMIN B12 PO) Take 1 tablet by mouth daily.    . hydrochlorothiazide (HYDRODIURIL) 25 MG tablet Take 1 tablet (25 mg total) by mouth daily as needed. 90 tablet 3  . ibuprofen (ADVIL,MOTRIN) 200 MG tablet Take 200 mg by mouth as needed.    . Vitamin D, Ergocalciferol, (DRISDOL) 1.25 MG (50000 UT) CAPS capsule Take 1 capsule (50,000 Units total) by mouth every 7 (seven) days. 4 capsule 0   No current facility-administered medications on file prior to visit.     PAST MEDICAL HISTORY: Past Medical History:  Diagnosis Date  . Allergy   . Constipation   . Hyperlipidemia   . Hypertension   . Joint pain     PAST SURGICAL HISTORY: Past Surgical History:  Procedure Laterality Date  . CHOLECYSTECTOMY  2011    SOCIAL HISTORY: Social History   Tobacco Use  . Smoking status: Never Smoker  . Smokeless tobacco: Never Used  Substance Use Topics  . Alcohol use: Yes    Comment: OCC  . Drug use: No    FAMILY HISTORY: Family History  Problem Relation Age of Onset  . Arthritis Mother   . Hyperlipidemia Mother   . Heart disease Mother   . Hypertension Mother   . Diabetes Mother   . Bladder Cancer Mother 89  . Depression Mother   . Obesity Mother   . Lung cancer Father        smoker  . Alcohol abuse Father   . Obesity Father   . Cancer Other         Ureter cancer  . Diabetes Sister 24  . Vision loss Sister   . Diabetes Mellitus II Brother 50  . Crohn's disease Brother 22    ROS: Review of Systems  Constitutional: Positive for weight loss.  Neurological:       Negative for lightheadedness  Psychiatric/Behavioral: Positive for depression. Negative for suicidal ideas.    PHYSICAL EXAM: Blood pressure 118/70, pulse (!) 55, temperature 98.1 F (36.7 C), temperature source Oral, height 5\' 5"  (1.651 m), weight 192 lb (87.1 kg), SpO2 98 %. Body mass index is 31.95 kg/m. Physical Exam Vitals signs reviewed.  Constitutional:      Appearance: Normal appearance. She is well-developed. She is obese.  Cardiovascular:     Rate and Rhythm: Normal rate.  Pulmonary:     Effort: Pulmonary effort is normal.  Musculoskeletal: Normal range of motion.  Skin:    General: Skin is warm and dry.  Neurological:     Mental Status: She is alert and oriented to person, place, and time.  Psychiatric:        Mood and Affect: Mood normal.        Behavior: Behavior normal.        Thought Content: Thought content does not include homicidal or suicidal ideation.     RECENT LABS AND TESTS: BMET    Component Value Date/Time   NA 138 04/02/2018 1120   K 4.0 04/02/2018 1120   CL 97 04/02/2018 1120   CO2 23 04/02/2018 1120   GLUCOSE 88 04/02/2018 1120   GLUCOSE 93 01/04/2018 0908   BUN 15 04/02/2018 1120   CREATININE 0.99 04/02/2018 1120   CALCIUM 9.8 04/02/2018 1120   GFRNONAA 63 04/02/2018 1120   GFRAA 72 04/02/2018 1120   Lab Results  Component Value Date   HGBA1C 5.5 04/02/2018   Lab Results  Component Value Date   INSULIN 10.9 04/02/2018   CBC    Component Value Date/Time   WBC 5.2 01/04/2018 0908   RBC 4.61 01/04/2018 0908   HGB 14.4 01/04/2018 0908   HCT 41.7 01/04/2018 0908   PLT 242.0 01/04/2018 0908   MCV 90.4 01/04/2018 0908   MCHC 34.5 01/04/2018 0908   RDW 13.4 01/04/2018 0908   LYMPHSABS 2.0 01/04/2018 0908    MONOABS 0.4 01/04/2018 0908   EOSABS 0.1 01/04/2018 0908   BASOSABS 0.0 01/04/2018 0908   Iron/TIBC/Ferritin/ %Sat No results found for: IRON, TIBC, FERRITIN, IRONPCTSAT Lipid Panel     Component Value Date/Time   CHOL 240 (H) 01/04/2018 0908   TRIG 192.0 (H) 01/04/2018 0908   HDL 46.40 01/04/2018 0908   CHOLHDL 5 01/04/2018 0908   VLDL 38.4 01/04/2018 0908   LDLCALC 156 (H) 01/04/2018 0908   LDLDIRECT 155.0 05/05/2016 1632   Hepatic Function Panel     Component Value Date/Time   PROT 6.9 04/02/2018 1120   ALBUMIN 4.5 04/02/2018 1120   AST 35 04/02/2018 1120   ALT 36 (H) 04/02/2018 1120   ALKPHOS 117 04/02/2018 1120   BILITOT 0.7 04/02/2018 1120   BILIDIR 0.2 10/07/2014 1152      Component Value Date/Time   TSH 1.74 01/04/2018 0908   TSH 1.68 12/29/2016 1139   TSH 1.78 10/09/2015 0952   Results for AKELIA, HUSTED (MRN [id number]) as of 05/31/2018 16:40  Ref. Range 04/02/2018 11:20  Vitamin D, 25-Hydroxy Latest Ref Range: 30.0 - 100.0 ng/mL 15.9 (L)     OBESITY BEHAVIORAL INTERVENTION VISIT  Today's visit was # 4   Starting weight: 198 lbs Starting date: 04/02/2018 Today's weight : 192 lbs Today's date: 05/31/2018 Total lbs lost to date: 6    05/31/2018  Height 5\' 5"  (1.651 m)  Weight 192 lb (87.1 kg)  BMI (Calculated) 31.95  BLOOD PRESSURE - SYSTOLIC 850  BLOOD PRESSURE - DIASTOLIC 70   Body Fat % 27.7 %  Total Body Water (lbs) 75 lbs    ASK: We discussed the diagnosis of obesity with Jetty Peeks today and Deshea agreed to give Tami Monroe permission to discuss obesity behavioral modification therapy today.  ASSESS: Kahlen has the diagnosis of obesity and her BMI today is  Kerissa is in the action stage of change   ADVISE: Itzy was educated on the multiple health risks of obesity as well as the benefit of weight loss to improve her health.  She was advised of the need for long term treatment and the importance of lifestyle modifications to improve her current health and  to decrease her risk of future health problems.  AGREE: Multiple dietary modification options and treatment options were discussed and  Rickiya agreed to follow the recommendations documented in the above note.  ARRANGE: Zanita was educated on the importance of frequent visits to treat obesity as outlined per CMS and USPSTF guidelines and agreed to schedule her next follow up appointment today.  Corey Skains, am acting as Location manager for [employer]. Owens Shark, DO

## 2018-06-05 ENCOUNTER — Encounter (INDEPENDENT_AMBULATORY_CARE_PROVIDER_SITE_OTHER): Payer: Self-pay

## 2018-06-16 ENCOUNTER — Other Ambulatory Visit (INDEPENDENT_AMBULATORY_CARE_PROVIDER_SITE_OTHER): Payer: Self-pay | Admitting: Bariatrics

## 2018-06-16 ENCOUNTER — Encounter (INDEPENDENT_AMBULATORY_CARE_PROVIDER_SITE_OTHER): Payer: Self-pay | Admitting: Bariatrics

## 2018-06-16 DIAGNOSIS — E559 Vitamin D deficiency, unspecified: Secondary | ICD-10-CM

## 2018-06-21 ENCOUNTER — Ambulatory Visit (INDEPENDENT_AMBULATORY_CARE_PROVIDER_SITE_OTHER): Payer: Self-pay | Admitting: Bariatrics

## 2018-06-21 ENCOUNTER — Telehealth (INDEPENDENT_AMBULATORY_CARE_PROVIDER_SITE_OTHER): Payer: Self-pay | Admitting: Psychology

## 2018-06-21 ENCOUNTER — Ambulatory Visit (INDEPENDENT_AMBULATORY_CARE_PROVIDER_SITE_OTHER): Payer: Self-pay | Admitting: Psychology

## 2018-06-21 NOTE — Telephone Encounter (Signed)
  Office: [phone]  /  Fax: [phone]  Date of Call: June 21, 2018 Time of Call: 11:43am Provider: Glennie Isle, PsyD  CONTENT: This provider called Jaline to check-in and schedule a follow-up appointment. A HIPAA compliant voicemail was left requesting a call back.  PLAN: This provider will wait for Jadia to call back or send a MyChart message to schedule an appointment.

## 2018-07-09 ENCOUNTER — Telehealth (INDEPENDENT_AMBULATORY_CARE_PROVIDER_SITE_OTHER): Payer: Self-pay | Admitting: Psychology

## 2018-07-09 NOTE — Telephone Encounter (Signed)
  Office: [phone]  /  Fax: [phone]  Date of Call: July 09, 2018 Time of Call: 10:15am Provider: Glennie Isle, PsyD  CONTENT: This provider called Tami Monroe again to check-in. A HIPAA compliant voicemail was left requesting a call back.   PLAN: This provider will wait for Tami Monroe to call back or send a MyChart message.

## 2018-11-12 DIAGNOSIS — D1801 Hemangioma of skin and subcutaneous tissue: Secondary | ICD-10-CM | POA: Diagnosis not present

## 2018-11-12 DIAGNOSIS — L820 Inflamed seborrheic keratosis: Secondary | ICD-10-CM | POA: Diagnosis not present

## 2018-11-12 DIAGNOSIS — L814 Other melanin hyperpigmentation: Secondary | ICD-10-CM | POA: Diagnosis not present

## 2018-11-12 DIAGNOSIS — L570 Actinic keratosis: Secondary | ICD-10-CM | POA: Diagnosis not present

## 2019-01-07 ENCOUNTER — Encounter: Payer: Self-pay | Admitting: Family Medicine

## 2019-01-07 ENCOUNTER — Encounter (HOSPITAL_BASED_OUTPATIENT_CLINIC_OR_DEPARTMENT_OTHER): Payer: Self-pay

## 2019-01-07 ENCOUNTER — Ambulatory Visit (INDEPENDENT_AMBULATORY_CARE_PROVIDER_SITE_OTHER): Payer: BC Managed Care – PPO | Admitting: Family Medicine

## 2019-01-07 ENCOUNTER — Other Ambulatory Visit (HOSPITAL_BASED_OUTPATIENT_CLINIC_OR_DEPARTMENT_OTHER): Payer: Self-pay | Admitting: Family Medicine

## 2019-01-07 ENCOUNTER — Other Ambulatory Visit: Payer: Self-pay

## 2019-01-07 ENCOUNTER — Ambulatory Visit (HOSPITAL_BASED_OUTPATIENT_CLINIC_OR_DEPARTMENT_OTHER)
Admission: RE | Admit: 2019-01-07 | Discharge: 2019-01-07 | Disposition: A | Discharge status: Home / Self Care | Payer: BC Managed Care – PPO | Source: Ambulatory Visit | Attending: Family Medicine | Admitting: Family Medicine

## 2019-01-07 VITALS — BP 110/70 | HR 55 | Temp 97.8°F | Resp 18 | Ht 65.0 in | Wt 201.2 lb

## 2019-01-07 DIAGNOSIS — Z1231 Encounter for screening mammogram for malignant neoplasm of breast: Secondary | ICD-10-CM | POA: Insufficient documentation

## 2019-01-07 DIAGNOSIS — E6609 Other obesity due to excess calories: Secondary | ICD-10-CM

## 2019-01-07 DIAGNOSIS — R600 Localized edema: Secondary | ICD-10-CM

## 2019-01-07 DIAGNOSIS — Z1211 Encounter for screening for malignant neoplasm of colon: Secondary | ICD-10-CM | POA: Diagnosis not present

## 2019-01-07 DIAGNOSIS — Z0000 Encounter for general adult medical examination without abnormal findings: Secondary | ICD-10-CM

## 2019-01-07 LAB — CBC WITH DIFFERENTIAL/PLATELET
Basophils Absolute: 0 10*3/uL (ref 0.0–0.1)
Basophils Relative: 0.6 % (ref 0.0–3.0)
Eosinophils Absolute: 0.2 10*3/uL (ref 0.0–0.7)
Eosinophils Relative: 3 % (ref 0.0–5.0)
HCT: 41 % (ref 36.0–46.0)
Hemoglobin: 13.9 g/dL (ref 12.0–15.0)
Lymphocytes Relative: 35.6 % (ref 12.0–46.0)
Lymphs Abs: 1.9 10*3/uL (ref 0.7–4.0)
MCHC: 33.8 g/dL (ref 30.0–36.0)
MCV: 91.6 fl (ref 78.0–100.0)
Monocytes Absolute: 0.4 10*3/uL (ref 0.1–1.0)
Monocytes Relative: 8 % (ref 3.0–12.0)
Neutro Abs: 2.8 10*3/uL (ref 1.4–7.7)
Neutrophils Relative %: 52.8 % (ref 43.0–77.0)
Platelets: 228 10*3/uL (ref 150.0–400.0)
RBC: 4.47 Mil/uL (ref 3.87–5.11)
RDW: 13.3 % (ref 11.5–15.5)
WBC: 5.4 10*3/uL (ref 4.0–10.5)

## 2019-01-07 LAB — COMPREHENSIVE METABOLIC PANEL
ALT: 22 U/L (ref 0–35)
AST: 23 U/L (ref 0–37)
Albumin: 4.2 g/dL (ref 3.5–5.2)
Alkaline Phosphatase: 110 U/L (ref 39–117)
BUN: 16 mg/dL (ref 6–23)
CO2: 31 mEq/L (ref 19–32)
Calcium: 9.1 mg/dL (ref 8.4–10.5)
Chloride: 98 mEq/L (ref 96–112)
Creatinine, Ser: 0.97 mg/dL (ref 0.40–1.20)
GFR: 58.51 mL/min — ABNORMAL LOW (ref >60.00)
Glucose, Bld: 90 mg/dL (ref 70–99)
Potassium: 3.7 mEq/L (ref 3.5–5.1)
Sodium: 138 mEq/L (ref 135–145)
Total Bilirubin: 0.7 mg/dL (ref 0.2–1.2)
Total Protein: 6.4 g/dL (ref 6.0–8.3)

## 2019-01-07 LAB — LIPID PANEL
Cholesterol: 238 mg/dL — ABNORMAL HIGH (ref 0–200)
HDL: 40.1 mg/dL (ref >39.00)
NonHDL: 198.04
Total CHOL/HDL Ratio: 6
Triglycerides: 225 mg/dL — ABNORMAL HIGH (ref 0.0–149.0)
VLDL: 45 mg/dL — ABNORMAL HIGH (ref 0.0–40.0)

## 2019-01-07 LAB — LDL CHOLESTEROL, DIRECT: Direct LDL: 163 mg/dL

## 2019-01-07 LAB — VITAMIN D 25 HYDROXY (VIT D DEFICIENCY, FRACTURES): VITD: 22.79 ng/mL — ABNORMAL LOW (ref 30.00–100.00)

## 2019-01-07 LAB — TSH: TSH: 1.84 u[IU]/mL (ref 0.35–4.50)

## 2019-01-07 LAB — VITAMIN B12: Vitamin B-12: >1500 pg/mL — ABNORMAL HIGH (ref 211–911)

## 2019-01-07 MED ORDER — HYDROCHLOROTHIAZIDE 25 MG PO TABS: 25.0000 mg | ORAL_TABLET | Freq: Every day | ORAL | 3 refills | Status: DC | PRN

## 2019-01-07 NOTE — Patient Instructions (Signed)
Preventive Care 40-60 Years Old, Female Preventive care refers to visits with your health care provider and lifestyle choices that can promote health and wellness. This includes:  A yearly physical exam. This may also be called an annual well check.  Regular dental visits and eye exams.  Immunizations.  Screening for certain conditions.  Healthy lifestyle choices, such as eating a healthy diet, getting regular exercise, not using drugs or products that contain nicotine and tobacco, and limiting alcohol use. What can I expect for my preventive care visit? Physical exam Your health care provider will check your:  Height and weight. This may be used to calculate body mass index (BMI), which tells if you are at a healthy weight.  Heart rate and blood pressure.  Skin for abnormal spots. Counseling Your health care provider may ask you questions about your:  Alcohol, tobacco, and drug use.  Emotional well-being.  Home and relationship well-being.  Sexual activity.  Eating habits.  Work and work environment.  Method of birth control.  Menstrual cycle.  Pregnancy history. What immunizations do I need?  Influenza (flu) vaccine  This is recommended every year. Tetanus, diphtheria, and pertussis (Tdap) vaccine  You may need a Td booster every 10 years. Varicella (chickenpox) vaccine  You may need this if you have not been vaccinated. Zoster (shingles) vaccine  You may need this after age 60. Measles, mumps, and rubella (MMR) vaccine  You may need at least one dose of MMR if you were born in 1957 or later. You may also need a second dose. Pneumococcal conjugate (PCV13) vaccine  You may need this if you have certain conditions and were not previously vaccinated. Pneumococcal polysaccharide (PPSV23) vaccine  You may need one or two doses if you smoke cigarettes or if you have certain conditions. Meningococcal conjugate (MenACWY) vaccine  You may need this if you  have certain conditions. Hepatitis A vaccine  You may need this if you have certain conditions or if you travel or work in places where you may be exposed to hepatitis A. Hepatitis B vaccine  You may need this if you have certain conditions or if you travel or work in places where you may be exposed to hepatitis B. Haemophilus influenzae type b (Hib) vaccine  You may need this if you have certain conditions. Human papillomavirus (HPV) vaccine  If recommended by your health care provider, you may need three doses over 6 months. You may receive vaccines as individual doses or as more than one vaccine together in one shot (combination vaccines). Talk with your health care provider about the risks and benefits of combination vaccines. What tests do I need? Blood tests  Lipid and cholesterol levels. These may be checked every 5 years, or more frequently if you are over 60 years old.  Hepatitis C test.  Hepatitis B test. Screening  Lung cancer screening. You may have this screening every year starting at age 60 if you have a 30-pack-year history of smoking and currently smoke or have quit within the past 15 years.  Colorectal cancer screening. All adults should have this screening starting at age 60 and continuing until age 75. Your health care provider may recommend screening at age 60 if you are at increased risk. You will have tests every 1-10 years, depending on your results and the type of screening test.  Diabetes screening. This is done by checking your blood sugar (glucose) after you have not eaten for a while (fasting). You may have this   done every 1-3 years.  Mammogram. This may be done every 1-2 years. Talk with your health care provider about when you should start having regular mammograms. This may depend on whether you have a family history of breast cancer.  BRCA-related cancer screening. This may be done if you have a family history of breast, ovarian, tubal, or peritoneal  cancers.  Pelvic exam and Pap test. This may be done every 3 years starting at age 60 at age 21. Starting at age 60, this may be done every 5 years if you have a Pap test in combination with an HPV test. Other tests  Sexually transmitted disease (STD) testing.  Bone density scan. This is done to screen for osteoporosis. You may have this scan if you are at high risk for osteoporosis. Follow these instructions at home: Eating and drinking  Eat a diet that includes fresh fruits and vegetables, whole grains, lean protein, and low-fat dairy.  Take vitamin and mineral supplements as recommended by your health care provider.  Do not drink alcohol if: ? Your health care provider tells you not to drink. ? You are pregnant, may be pregnant, or are planning to become pregnant.  If you drink alcohol: ? Limit how much you have to 0-1 drink a day. ? Be aware of how much alcohol is in your drink. In the [place] one drink equals one 12 oz bottle of beer (355 mL), one 5 oz glass of wine (148 mL), or one 1 oz glass of hard liquor (44 mL). Lifestyle  Take daily care of your teeth and gums.  Stay active. Exercise for at least 30 minutes on 5 or more days each week.  Do not use any products that contain nicotine or tobacco, such as cigarettes, e-cigarettes, and chewing tobacco. If you need help quitting, ask your health care provider.  If you are sexually active, practice safe sex. Use a condom or other form of birth control (contraception) in order to prevent pregnancy and STIs (sexually transmitted infections).  If told by your health care provider, take low-dose aspirin daily starting at age 60. What's next?  Visit your health care provider once a year for a well check visit.  Ask your health care provider how often you should have your eyes and teeth checked.  Stay up to date on all vaccines. This information is not intended to replace advice given to you by your health care provider. Make sure you  discuss any questions you have with your health care provider. Document Released: 03/27/2015 Document Revised: 11/09/2017 Document Reviewed: 11/09/2017 Elsevier Patient Education  2020 Elsevier Inc.  

## 2019-01-07 NOTE — Progress Notes (Signed)
Subjective:     Tami Monroe is a 58 y.o. female and is here for a comprehensive physical exam. The patient reports no problems.  Social History   Socioeconomic History  . Marital status: Single    Spouse name: Not on file  . Number of children: Not on file  . Years of education: Not on file  . Highest education level: Not on file  Occupational History  . Occupation: Freight forwarder in [employer]: [employer]  . Financial resource strain: Not on file  . Food insecurity    Worry: Not on file    Inability: Not on file  . Transportation needs    Medical: Not on file    Non-medical: Not on file  Tobacco Use  . Smoking status: Never Smoker  . Smokeless tobacco: Never Used  Substance and Sexual Activity  . Alcohol use: Yes    Comment: OCC  . Drug use: No  . Sexual activity: Not Currently    Partners: Male  Lifestyle  . Physical activity    Days per week: Not on file    Minutes per session: Not on file  . Stress: Not on file  Relationships  . Social Herbalist on phone: Not on file    Gets together: Not on file    Attends religious service: Not on file    Active member of club or organization: Not on file    Attends meetings of clubs or organizations: Not on file    Relationship status: Not on file  . Intimate partner violence    Fear of current or ex partner: Not on file    Emotionally abused: Not on file    Physically abused: Not on file    Forced sexual activity: Not on file  Other Topics Concern  . Not on file  Social History Narrative   -Exercise---  no   Health Maintenance  Topic Date Due  . MAMMOGRAM  01/12/2018  . COLONOSCOPY  03/14/2018  . HIV Screening  01/05/2024 (Originally 08/28/1975)  . PAP SMEAR-Modifier  12/30/2019  . TETANUS/TDAP  10/08/2025  . INFLUENZA VACCINE  Completed  . Hepatitis C Screening  Completed    The following portions of the patient's history were reviewed and updated as appropriate: She  has  a past medical history of Allergy, Constipation, Hyperlipidemia, Hypertension, and Joint pain. She does not have any pertinent problems on file. She  has a past surgical history that includes Cholecystectomy (2011). Her family history includes Alcohol abuse in her father; Arthritis in her mother; Bladder Cancer (age of onset: 58) in her mother; Cancer in an other family member; Crohn's disease (age of onset: 52) in her brother; Depression in her mother; Diabetes in her mother; Diabetes (age of onset: 50) in her sister; Diabetes Mellitus II (age of onset: 91) in her brother; Heart disease in her mother; Hyperlipidemia in her mother; Hypertension in her mother; Lung cancer in her father; Obesity in her father and mother; Vision loss in her sister. She  reports that she has never smoked. She has never used smokeless tobacco. She reports current alcohol use. She reports that she does not use drugs. She has a current medication list which includes the following prescription(s): cyanocobalamin, hydrochlorothiazide, ibuprofen, and vitamin d (ergocalciferol). Current Outpatient Medications on File Prior to Visit  Medication Sig Dispense Refill  . Cyanocobalamin (VITAMIN B12 PO) Take 1 tablet by mouth daily.    Marland Kitchen  hydrochlorothiazide (HYDRODIURIL) 25 MG tablet Take 1 tablet (25 mg total) by mouth daily as needed. 90 tablet 3  . ibuprofen (ADVIL,MOTRIN) 200 MG tablet Take 200 mg by mouth as needed.    . Vitamin D, Ergocalciferol, (DRISDOL) 1.25 MG (50000 UT) CAPS capsule Take 1 capsule (50,000 Units total) by mouth every 7 (seven) days. 4 capsule 0   No current facility-administered medications on file prior to visit.    She has No Known Allergies..  Review of Systems Review of Systems  Constitutional: Negative for activity change, appetite change and fatigue.  HENT: Negative for hearing loss, congestion, tinnitus and ear discharge.  dentist q60m Eyes: Negative for visual disturbance (see optho q1y --  vision corrected to 20/20 with glasses).  Respiratory: Negative for cough, chest tightness and shortness of breath.   Cardiovascular: Negative for chest pain, palpitations and leg swelling.  Gastrointestinal: Negative for abdominal pain, diarrhea, constipation and abdominal distention.  Genitourinary: Negative for urgency, frequency, decreased urine volume and difficulty urinating.  Musculoskeletal: Negative for back pain, arthralgias and gait problem.  Skin: Negative for color change, pallor and rash.  Neurological: Negative for dizziness, light-headedness, numbness and headaches.  Hematological: Negative for adenopathy. Does not bruise/bleed easily.  Psychiatric/Behavioral: Negative for suicidal ideas, confusion, sleep disturbance, self-injury, dysphoric mood, decreased concentration and agitation.       Objective:    BP 110/70 (BP Location: Right Arm, Patient Position: Sitting, Cuff Size: Large)   Pulse (!) 55   Temp 97.8 F (36.6 C) (Temporal)   Resp 18   Ht 5\' 5"  (1.651 m)   Wt 201 lb 3.2 oz (91.3 kg)   SpO2 98%   BMI 33.48 kg/m  General appearance: alert, cooperative, appears stated age and no distress Head: Normocephalic, without obvious abnormality, atraumatic Eyes: conjunctivae/corneas clear. PERRL, EOM's intact. Fundi benign. Ears: normal TM's and external ear canals both ears Nose: Nares normal. Septum midline. Mucosa normal. No drainage or sinus tenderness. Throat: lips, mucosa, and tongue normal; teeth and gums normal Neck: no adenopathy, no carotid bruit, no JVD, supple, symmetrical, trachea midline and thyroid not enlarged, symmetric, no tenderness/mass/nodules Back: symmetric, no curvature. ROM normal. No CVA tenderness. Lungs: clear to auscultation bilaterally Breasts: normal appearance, no masses or tenderness Heart: regular rate and rhythm, S1, S2 normal, no murmur, click, rub or gallop Abdomen: soft, non-tender; bowel sounds normal; no masses,  no  organomegaly Pelvic: deferred Extremities: extremities normal, atraumatic, no cyanosis or edema Pulses: 2+ and symmetric Skin: Skin color, texture, turgor normal. No rashes or lesions Lymph nodes: Cervical, supraclavicular, and axillary nodes normal. Neurologic: Alert and oriented X 3, normal strength and tone. Normal symmetric reflexes. Normal coordination and gait    Assessment:    Healthy female exam.      Plan:    ghm utd Check labs See After Visit Summary for Counseling Recommendations    1. Preventative health care See above - Lipid panel - TSH - CBC with Differential/Platelet - Comprehensive metabolic panel - Vitamin 123456 - Vitamin D (25 hydroxy)  2. Class 1 obesity due to excess calories without serious comorbidity in adult, unspecified BMI Pt to start back on diet  - Lipid panel - TSH - CBC with Differential/Platelet - Comprehensive metabolic panel - Vitamin 123456 - Vitamin D (25 hydroxy)  3. Colon cancer screening  - Ambulatory referral to Gastroenterology  4. Bilateral edema of lower extremity Stable  - hydrochlorothiazide (HYDRODIURIL) 25 MG tablet; Take 1 tablet (25 mg total) by mouth daily  as needed.  Dispense: 90 tablet; Refill: 3

## 2019-01-09 ENCOUNTER — Other Ambulatory Visit: Payer: Self-pay | Admitting: Family Medicine

## 2019-01-09 ENCOUNTER — Other Ambulatory Visit: Payer: Self-pay

## 2019-01-09 DIAGNOSIS — E559 Vitamin D deficiency, unspecified: Secondary | ICD-10-CM

## 2019-01-09 DIAGNOSIS — E785 Hyperlipidemia, unspecified: Secondary | ICD-10-CM

## 2019-01-09 MED ORDER — VITAMIN D (ERGOCALCIFEROL) 1.25 MG (50000 UNIT) PO CAPS: 50000.0000 [IU] | ORAL_CAPSULE | ORAL | 3 refills | Status: DC

## 2019-01-18 ENCOUNTER — Encounter: Payer: Self-pay | Admitting: Family Medicine

## 2019-01-30 ENCOUNTER — Ambulatory Visit (AMBULATORY_SURGERY_CENTER): Payer: BC Managed Care – PPO | Admitting: *Deleted

## 2019-01-30 ENCOUNTER — Other Ambulatory Visit: Payer: Self-pay

## 2019-01-30 VITALS — Temp 96.8°F | Ht 65.0 in | Wt 202.0 lb

## 2019-01-30 DIAGNOSIS — Z1159 Encounter for screening for other viral diseases: Secondary | ICD-10-CM

## 2019-01-30 DIAGNOSIS — Z1211 Encounter for screening for malignant neoplasm of colon: Secondary | ICD-10-CM

## 2019-01-30 MED ORDER — NA SULFATE-K SULFATE-MG SULF 17.5-3.13-1.6 GM/177ML PO SOLN: 1.0000 | Freq: Once | ORAL | 0 refills | Status: AC

## 2019-01-30 NOTE — Progress Notes (Signed)
No egg or soy allergy known to patient  No issues with past sedation with any surgeries  or procedures, no intubation problems  No diet pills per patient No home 02 use per patient  No blood thinners per patient  Pt denies issues with constipation  No A fib or A flutter  EMMI video sent to pt's e mail   Due to the COVID-19 pandemic we are asking patients to follow these guidelines. Please only bring one care partner. Please be aware that your care partner may wait in the car in the parking lot or if they feel like they will be too hot to wait in the car, they may wait in the lobby on the 4th floor. All care partners are required to wear a mask the entire time (we do not have any that we can provide them), they need to practice social distancing, and we will do a Covid check for all patient's and care partners when you arrive. Also we will check their temperature and your temperature. If the care partner waits in their car they need to stay in the parking lot the entire time and we will call them on their cell phone when the patient is ready for discharge so they can bring the car to the front of the building. Also all patient's will need to wear a mask into building.  COVID TEST-02/12/19, 8:25 AM  SUPREP COUPON PROVIDED

## 2019-02-01 ENCOUNTER — Encounter: Payer: Self-pay | Admitting: Gastroenterology

## 2019-02-12 ENCOUNTER — Ambulatory Visit (INDEPENDENT_AMBULATORY_CARE_PROVIDER_SITE_OTHER): Payer: BC Managed Care – PPO

## 2019-02-12 ENCOUNTER — Other Ambulatory Visit: Payer: Self-pay | Admitting: Gastroenterology

## 2019-02-12 DIAGNOSIS — Z1159 Encounter for screening for other viral diseases: Secondary | ICD-10-CM

## 2019-02-13 LAB — SARS CORONAVIRUS 2 (TAT 6-24 HRS): SARS Coronavirus 2: NEGATIVE

## 2019-02-15 ENCOUNTER — Other Ambulatory Visit: Payer: Self-pay

## 2019-02-15 ENCOUNTER — Encounter: Payer: Self-pay | Admitting: Gastroenterology

## 2019-02-15 ENCOUNTER — Ambulatory Visit (AMBULATORY_SURGERY_CENTER): Payer: BC Managed Care – PPO | Admitting: Gastroenterology

## 2019-02-15 VITALS — BP 117/65 | HR 56 | Temp 97.9°F | Resp 21 | Ht 65.0 in | Wt 202.0 lb

## 2019-02-15 DIAGNOSIS — K635 Polyp of colon: Secondary | ICD-10-CM

## 2019-02-15 DIAGNOSIS — D120 Benign neoplasm of cecum: Secondary | ICD-10-CM

## 2019-02-15 DIAGNOSIS — Z1211 Encounter for screening for malignant neoplasm of colon: Secondary | ICD-10-CM

## 2019-02-15 MED ORDER — SODIUM CHLORIDE 0.9 % IV SOLN: 500.0000 mL | INTRAVENOUS | Status: DC

## 2019-02-15 NOTE — Progress Notes (Signed)
Called to room to assist during endoscopic procedure.  Patient ID and intended procedure confirmed with present staff. Received instructions for my participation in the procedure from the performing physician.  

## 2019-02-15 NOTE — Op Note (Signed)
[facility] Patient Name: Shaqueta Coder Procedure Date: 02/15/2019 1:56 PM MRN: [id number] Endoscopist: Mauri Pole , MD Age: 58 Referring MD:  Date of Birth: 1960-10-17 Gender: Female Account #: [id number] Procedure:                Colonoscopy Indications:              Screening for colorectal malignant neoplasm Medicines:                Monitored Anesthesia Care Procedure:                Pre-Anesthesia Assessment:                           - Prior to the procedure, a History and Physical                            was performed, and patient medications and                            allergies were reviewed. The patient's tolerance of                            previous anesthesia was also reviewed. The risks                            and benefits of the procedure and the sedation                            options and risks were discussed with the patient.                            All questions were answered, and informed consent                            was obtained. Prior Anticoagulants: The patient has                            taken no previous anticoagulant or antiplatelet                            agents. ASA Grade Assessment: II - A patient with                            mild systemic disease. After reviewing the risks                            and benefits, the patient was deemed in                            satisfactory condition to undergo the procedure.                           After obtaining informed consent, the colonoscope  was passed under direct vision. Throughout the                            procedure, the patient's blood pressure, pulse, and                            oxygen saturations were monitored continuously. The                            Colonoscope was introduced through the anus and                            advanced to the the cecum, identified by                            appendiceal orifice and  ileocecal valve. The                            colonoscopy was performed without difficulty. The                            patient tolerated the procedure well. The quality                            of the bowel preparation was excellent. The                            ileocecal valve, appendiceal orifice, and rectum                            were photographed. Scope In: 1:59:57 PM Scope Out: 2:21:04 PM Scope Withdrawal Time: 0 hours 13 minutes 41 seconds  Total Procedure Duration: 0 hours 21 minutes 7 seconds  Findings:                 The perianal and digital rectal examinations were                            normal.                           A 10 mm polyp was found in the ileocecal valve. The                            polyp was sessile. Polypectomy was attempted,                            initially using a cold snare. Polyp resection was                            incomplete with this device. This intervention then                            required a different device and polypectomy  technique. The polyp was removed with a hot snare.                            Resection and retrieval were complete.                           Multiple small and large-mouthed diverticula were                            found in the sigmoid colon and descending colon.                            There was narrowing of the colon in association                            with the diverticular opening. There was evidence                            of diverticular spasm. There was evidence of an                            impacted diverticulum.                           Non-bleeding internal hemorrhoids were found during                            retroflexion. The hemorrhoids were small.                           The exam was otherwise without abnormality. Complications:            No immediate complications. Estimated Blood Loss:     Estimated blood loss was  minimal. Impression:               - One 10 mm polyp at the ileocecal valve, removed                            with a hot snare. Resected and retrieved.                           - Severe diverticulosis in the sigmoid colon and in                            the descending colon. There was narrowing of the                            colon in association with the diverticular opening.                            There was evidence of diverticular spasm. There was                            evidence of an impacted diverticulum.                           -  Non-bleeding internal hemorrhoids.                           - The examination was otherwise normal. Recommendation:           - Patient has a contact number available for                            emergencies. The signs and symptoms of potential                            delayed complications were discussed with the                            patient. Return to normal activities tomorrow.                            Written discharge instructions were provided to the                            patient.                           - Resume previous diet.                           - Continue present medications.                           - Await pathology results.                           - Repeat colonoscopy in 3 - 5 years for                            surveillance based on pathology results. Mauri Pole, MD 02/15/2019 2:26:09 PM This report has been signed electronically.

## 2019-02-15 NOTE — Patient Instructions (Signed)
Handouts on polyps ,diverticulosis,& hemorrhoids given to you today  Await pathology on polyp removed today   YOU HAD AN ENDOSCOPIC PROCEDURE TODAY AT [facility]:   Refer to the procedure report that was given to you for any specific questions about what was found during the examination.  If the procedure report does not answer your questions, please call your gastroenterologist to clarify.  If you requested that your care partner not be given the details of your procedure findings, then the procedure report has been included in a sealed envelope for you to review at your convenience later.  YOU SHOULD EXPECT: Some feelings of bloating in the abdomen. Passage of more gas than usual.  Walking can help get rid of the air that was put into your GI tract during the procedure and reduce the bloating. If you had a lower endoscopy (such as a colonoscopy or flexible sigmoidoscopy) you may notice spotting of blood in your stool or on the toilet paper. If you underwent a bowel prep for your procedure, you may not have a normal bowel movement for a few days.  Please Note:  You might notice some irritation and congestion in your nose or some drainage.  This is from the oxygen used during your procedure.  There is no need for concern and it should clear up in a day or so.  SYMPTOMS TO REPORT IMMEDIATELY:   Following lower endoscopy (colonoscopy or flexible sigmoidoscopy):  Excessive amounts of blood in the stool  Significant tenderness or worsening of abdominal pains  Swelling of the abdomen that is new, acute  Fever of 100F or higher    For urgent or emergent issues, a gastroenterologist can be reached at any hour by calling [phone].   DIET:  We do recommend a small meal at first, but then you may proceed to your regular diet.  Drink plenty of fluids but you should avoid alcoholic beverages for 24 hours.  ACTIVITY:  You should plan to take it easy for the rest of today and  you should NOT DRIVE or use heavy machinery until tomorrow (because of the sedation medicines used during the test).    FOLLOW UP: Our staff will call the number listed on your records 48-72 hours following your procedure to check on you and address any questions or concerns that you may have regarding the information given to you following your procedure. If we do not reach you, we will leave a message.  We will attempt to reach you two times.  During this call, we will ask if you have developed any symptoms of COVID 19. If you develop any symptoms (ie: fever, flu-like symptoms, shortness of breath, cough etc.) before then, please call [phone].  If you test positive for Covid 19 in the 2 weeks post procedure, please call and report this information to [place].    If any biopsies were taken you will be contacted by phone or by letter within the next 1-3 weeks.  Please call us at [phone] if you have not heard about the biopsies in 3 weeks.    SIGNATURES/CONFIDENTIALITY: You and/or your care partner have signed paperwork which will be entered into your electronic medical record.  These signatures attest to the fact that that the information above on your After Visit Summary has been reviewed and is understood.  Full responsibility of the confidentiality of this discharge information lies with you and/or your care-partner.

## 2019-02-15 NOTE — Progress Notes (Signed)
To PACU, VSS. Report to [email or web address] 

## 2019-02-19 ENCOUNTER — Telehealth: Payer: Self-pay

## 2019-02-19 NOTE — Telephone Encounter (Signed)
  Follow up Call-  Call back number 02/15/2019  Post procedure Call Back phone  # [phone]  Permission to leave phone message Yes  Some recent data might be hidden     Patient questions:  Do you have a fever, pain , or abdominal swelling? No. Pain Score  0 *  Have you tolerated food without any problems? Yes.    Have you been able to return to your normal activities? Yes.    Do you have any questions about your discharge instructions: Diet   No. Medications  No. Follow up visit  No.  Do you have questions or concerns about your Care? No.  Actions: * If pain score is 4 or above: No action needed, pain <4. 1. Have you developed a fever since your procedure? no  2.   Have you had an respiratory symptoms (SOB or cough) since your procedure? no  3.   Have you tested positive for COVID 19 since your procedure no  4.   Have you had any family members/close contacts diagnosed with the COVID 19 since your procedure?  no   If yes to any of these questions please route to Joylene John, RN and Alphonsa Gin, Therapist, sports.

## 2019-02-22 ENCOUNTER — Encounter: Payer: Self-pay | Admitting: Gastroenterology

## 2019-03-04 DIAGNOSIS — L570 Actinic keratosis: Secondary | ICD-10-CM | POA: Diagnosis not present

## 2019-11-12 DIAGNOSIS — L570 Actinic keratosis: Secondary | ICD-10-CM | POA: Diagnosis not present

## 2019-11-12 DIAGNOSIS — L719 Rosacea, unspecified: Secondary | ICD-10-CM | POA: Diagnosis not present

## 2019-11-12 DIAGNOSIS — L304 Erythema intertrigo: Secondary | ICD-10-CM | POA: Diagnosis not present

## 2019-11-12 DIAGNOSIS — D485 Neoplasm of uncertain behavior of skin: Secondary | ICD-10-CM | POA: Diagnosis not present

## 2019-11-12 DIAGNOSIS — L821 Other seborrheic keratosis: Secondary | ICD-10-CM | POA: Diagnosis not present

## 2019-11-12 DIAGNOSIS — L820 Inflamed seborrheic keratosis: Secondary | ICD-10-CM | POA: Diagnosis not present

## 2020-01-09 ENCOUNTER — Ambulatory Visit (INDEPENDENT_AMBULATORY_CARE_PROVIDER_SITE_OTHER): Payer: BC Managed Care – PPO | Admitting: Family Medicine

## 2020-01-09 ENCOUNTER — Ambulatory Visit (HOSPITAL_BASED_OUTPATIENT_CLINIC_OR_DEPARTMENT_OTHER)
Admission: RE | Admit: 2020-01-09 | Discharge: 2020-01-09 | Disposition: A | Discharge status: Home / Self Care | Payer: BC Managed Care – PPO | Source: Ambulatory Visit | Attending: Family Medicine | Admitting: Family Medicine

## 2020-01-09 ENCOUNTER — Other Ambulatory Visit (HOSPITAL_COMMUNITY)
Admission: RE | Admit: 2020-01-09 | Discharge: 2020-01-09 | Disposition: A | Discharge status: Home / Self Care | Payer: BC Managed Care – PPO | Source: Ambulatory Visit | Attending: Family Medicine | Admitting: Family Medicine

## 2020-01-09 ENCOUNTER — Other Ambulatory Visit: Payer: Self-pay

## 2020-01-09 ENCOUNTER — Encounter: Payer: Self-pay | Admitting: Family Medicine

## 2020-01-09 VITALS — BP 120/68 | HR 60 | Temp 97.9°F | Resp 18 | Ht 65.0 in | Wt 204.4 lb

## 2020-01-09 DIAGNOSIS — M25551 Pain in right hip: Secondary | ICD-10-CM | POA: Insufficient documentation

## 2020-01-09 DIAGNOSIS — E2839 Other primary ovarian failure: Secondary | ICD-10-CM

## 2020-01-09 DIAGNOSIS — E538 Deficiency of other specified B group vitamins: Secondary | ICD-10-CM

## 2020-01-09 DIAGNOSIS — Z23 Encounter for immunization: Secondary | ICD-10-CM

## 2020-01-09 DIAGNOSIS — M545 Low back pain, unspecified: Secondary | ICD-10-CM | POA: Diagnosis not present

## 2020-01-09 DIAGNOSIS — R600 Localized edema: Secondary | ICD-10-CM

## 2020-01-09 DIAGNOSIS — Z0000 Encounter for general adult medical examination without abnormal findings: Secondary | ICD-10-CM | POA: Insufficient documentation

## 2020-01-09 DIAGNOSIS — Z13820 Encounter for screening for osteoporosis: Secondary | ICD-10-CM | POA: Diagnosis not present

## 2020-01-09 DIAGNOSIS — E559 Vitamin D deficiency, unspecified: Secondary | ICD-10-CM | POA: Diagnosis not present

## 2020-01-09 DIAGNOSIS — Z780 Asymptomatic menopausal state: Secondary | ICD-10-CM | POA: Diagnosis not present

## 2020-01-09 MED ORDER — HYDROCHLOROTHIAZIDE 25 MG PO TABS: 25.0000 mg | ORAL_TABLET | Freq: Every day | ORAL | 3 refills | Status: DC | PRN

## 2020-01-09 NOTE — Patient Instructions (Signed)

## 2020-01-09 NOTE — Progress Notes (Signed)
Subjective:     Tami Monroe is a 59 y.o. female and is here for a comprehensive physical exam. The patient reports no problems.  Social History   Socioeconomic History  . Marital status: Single    Spouse name: Not on file  . Number of children: Not on file  . Years of education: Not on file  . Highest education level: Not on file  Occupational History  . Occupation: Freight forwarder in Surveyor, minerals: [employer]  Tobacco Use  . Smoking status: Never Smoker  . Smokeless tobacco: Never Used  Vaping Use  . Vaping Use: Never used  Substance and Sexual Activity  . Alcohol use: Yes    Comment: OCC  . Drug use: No  . Sexual activity: Not Currently    Partners: Male  Other Topics Concern  . Not on file  Social History Narrative   -Exercise---  no   Social Determinants of Health   Financial Resource Strain:   . Difficulty of Paying Living Expenses: Not on file  Food Insecurity:   . Worried About Charity fundraiser in the Last Year: Not on file  . Ran Out of Food in the Last Year: Not on file  Transportation Needs:   . Lack of Transportation (Medical): Not on file  . Lack of Transportation (Non-Medical): Not on file  Physical Activity:   . Days of Exercise per Week: Not on file  . Minutes of Exercise per Session: Not on file  Stress:   . Feeling of Stress : Not on file  Social Connections:   . Frequency of Communication with Friends and Family: Not on file  . Frequency of Social Gatherings with Friends and Family: Not on file  . Attends Religious Services: Not on file  . Active Member of Clubs or Organizations: Not on file  . Attends Archivist Meetings: Not on file  . Marital Status: Not on file  Intimate Partner Violence:   . Fear of Current or Ex-Partner: Not on file  . Emotionally Abused: Not on file  . Physically Abused: Not on file  . Sexually Abused: Not on file   Health Maintenance  Topic Date Due  . INFLUENZA VACCINE  10/13/2019  .  MAMMOGRAM  01/07/2020  . PAP SMEAR-Modifier  12/30/2019  . HIV Screening  01/05/2024 (Originally 08/28/1973)  . TETANUS/TDAP  10/08/2025  . COLONOSCOPY  02/14/2029  . COVID-19 Vaccine  Completed  . Hepatitis C Screening  Completed    The following portions of the patient's history were reviewed and updated as appropriate:  She  has a past medical history of Allergy, Arthritis, Constipation, Hyperlipidemia, Hypertension, and Joint pain. She does not have any pertinent problems on file. She  has a past surgical history that includes Cholecystectomy (2011). Her family history includes Alcohol abuse in her father; Arthritis in her mother; Bladder Cancer (age of onset: 77) in her mother; Cancer in an other family member; Crohn's disease (age of onset: 35) in her brother; Depression in her mother; Diabetes in her mother; Diabetes (age of onset: 30) in her sister; Diabetes Mellitus II (age of onset: 42) in her brother; Heart disease in her brother and mother; Hyperlipidemia in her mother; Hypertension in her mother; Lung cancer in her father; Obesity in her father and mother; Stroke in her sister; Vision loss in her sister. She  reports that she has never smoked. She has never used smokeless tobacco. She reports current alcohol use. She  reports that she does not use drugs. She has a current medication list which includes the following prescription(s): hydrochlorothiazide and ibuprofen, and the following Facility-Administered Medications: sodium chloride. Current Outpatient Medications on File Prior to Visit  Medication Sig Dispense Refill  . ibuprofen (ADVIL,MOTRIN) 200 MG tablet Take 200 mg by mouth as needed.     Current Facility-Administered Medications on File Prior to Visit  Medication Dose Route Frequency Provider Last Rate Last Admin  . 0.9 %  sodium chloride infusion  500 mL Intravenous Continuous Nandigam, Venia Minks, MD       She has No Known Allergies..  Review of Systems Review of  Systems  Constitutional: Negative for activity change, appetite change and fatigue.  HENT: Negative for hearing loss, congestion, tinnitus and ear discharge.  dentist q26m Eyes: Negative for visual disturbance (see optho q1y -- vision corrected to 20/20 with glasses).  Respiratory: Negative for cough, chest tightness and shortness of breath.   Cardiovascular: Negative for chest pain, palpitations and leg swelling.  Gastrointestinal: Negative for abdominal pain, diarrhea, constipation and abdominal distention.  Genitourinary: Negative for urgency, frequency, decreased urine volume and difficulty urinating.  Musculoskeletal: Negative for back pain, arthralgias and gait problem.  Skin: Negative for color change, pallor and rash.  Neurological: Negative for dizziness, light-headedness, numbness and headaches.  Hematological: Negative for adenopathy. Does not bruise/bleed easily.  Psychiatric/Behavioral: Negative for suicidal ideas, confusion, sleep disturbance, self-injury, dysphoric mood, decreased concentration and agitation.       Objective:    BP 120/68 (BP Location: Right Arm, Patient Position: Sitting, Cuff Size: Large)   Pulse 60   Temp 97.9 F (36.6 C) (Oral)   Resp 18   Ht 5\' 5"  (1.651 m)   Wt 204 lb 6.4 oz (92.7 kg)   SpO2 98%   BMI 34.01 kg/m  General appearance: alert, cooperative, appears stated age and no distress Head: Normocephalic, without obvious abnormality, atraumatic Eyes: negative findings: lids and lashes normal, conjunctivae and sclerae normal and pupils equal, round, reactive to light and accomodation Ears: normal TM's and external ear canals both ears Neck: no adenopathy, no carotid bruit, no JVD, supple, symmetrical, trachea midline and thyroid not enlarged, symmetric, no tenderness/mass/nodules Back: symmetric, no curvature. ROM normal. No CVA tenderness. Lungs: clear to auscultation bilaterally Breasts: normal appearance, no masses or tenderness Heart:  regular rate and rhythm, S1, S2 normal, no murmur, click, rub or gallop Abdomen: soft, non-tender; bowel sounds normal; no masses,  no organomegaly Pelvic: cervix normal in appearance, external genitalia normal, no adnexal masses or tenderness, no cervical motion tenderness, rectovaginal septum normal, uterus normal size, shape, and consistency, vagina normal without discharge and pap done Extremities: extremities normal, atraumatic, no cyanosis or edema Pulses: 2+ and symmetric Skin: Skin color, texture, turgor normal. No rashes or lesions Lymph nodes: Cervical, supraclavicular, and axillary nodes normal. Neurologic: Alert and oriented X 3, normal strength and tone. Normal symmetric reflexes. Normal coordination and gait    Assessment:    Healthy female exam.      Plan:     ghm utd Check labs  See After Visit Summary for Counseling Recommendations    1. Bilateral edema of lower extremity Stable Elevated legs  - hydrochlorothiazide (HYDRODIURIL) 25 MG tablet; Take 1 tablet (25 mg total) by mouth daily as needed.  Dispense: 90 tablet; Refill: 3  2. Preventative health care See above  - Cytology - PAP( ) - Lipid panel - CBC with Differential/Platelet - Comprehensive metabolic panel - TSH  3. Vitamin D deficiency Check labs  - Vitamin D (25 hydroxy)  4. Vitamin B12 deficiency Check labs  - Vitamin B12  5. Need for influenza vaccination   - Flu Vaccine QUAD 36+ mos IM  6. Right hip pain ? Coming from low back  - DG Lumbar Spine Complete; Future  7. Estrogen deficiency   - DG Bone Density; Future

## 2020-01-10 LAB — LIPID PANEL
Cholesterol: 254 mg/dL — ABNORMAL HIGH (ref <200)
HDL: 53 mg/dL (ref >=50)
LDL Cholesterol (Calc): 175 mg/dL (calc) — ABNORMAL HIGH
Non-HDL Cholesterol (Calc): 201 mg/dL (calc) — ABNORMAL HIGH (ref <130)
Total CHOL/HDL Ratio: 4.8 (calc) (ref <5.0)
Triglycerides: 123 mg/dL (ref <150)

## 2020-01-10 LAB — CBC WITH DIFFERENTIAL/PLATELET
Absolute Monocytes: 336 cells/uL (ref 200–950)
Basophils Absolute: 19 cells/uL (ref 0–200)
Basophils Relative: 0.4 %
Eosinophils Absolute: 130 cells/uL (ref 15–500)
Eosinophils Relative: 2.7 %
HCT: 43.4 % (ref 35.0–45.0)
Hemoglobin: 15.3 g/dL (ref 11.7–15.5)
Lymphs Abs: 1646 cells/uL (ref 850–3900)
MCH: 32 pg (ref 27.0–33.0)
MCHC: 35.3 g/dL (ref 32.0–36.0)
MCV: 90.8 fL (ref 80.0–100.0)
MPV: 10 fL (ref 7.5–12.5)
Monocytes Relative: 7 %
Neutro Abs: 2669 cells/uL (ref 1500–7800)
Neutrophils Relative %: 55.6 %
Platelets: 236 10*3/uL (ref 140–400)
RBC: 4.78 10*6/uL (ref 3.80–5.10)
RDW: 12.4 % (ref 11.0–15.0)
Total Lymphocyte: 34.3 %
WBC: 4.8 10*3/uL (ref 3.8–10.8)

## 2020-01-10 LAB — COMPREHENSIVE METABOLIC PANEL
AG Ratio: 1.6 (calc) (ref 1.0–2.5)
ALT: 38 U/L — ABNORMAL HIGH (ref 6–29)
AST: 34 U/L (ref 10–35)
Albumin: 4.4 g/dL (ref 3.6–5.1)
Alkaline phosphatase (APISO): 110 U/L (ref 37–153)
BUN: 22 mg/dL (ref 7–25)
CO2: 30 mmol/L (ref 20–32)
Calcium: 9.8 mg/dL (ref 8.6–10.4)
Chloride: 99 mmol/L (ref 98–110)
Creat: 0.97 mg/dL (ref 0.50–0.99)
Globulin: 2.8 g/dL (calc) (ref 1.9–3.7)
Glucose, Bld: 96 mg/dL (ref 65–99)
Potassium: 4.2 mmol/L (ref 3.5–5.3)
Sodium: 138 mmol/L (ref 135–146)
Total Bilirubin: 0.9 mg/dL (ref 0.2–1.2)
Total Protein: 7.2 g/dL (ref 6.1–8.1)

## 2020-01-10 LAB — CYTOLOGY - PAP: Diagnosis: NEGATIVE

## 2020-01-10 LAB — VITAMIN D 25 HYDROXY (VIT D DEFICIENCY, FRACTURES): Vit D, 25-Hydroxy: 25 ng/mL — ABNORMAL LOW (ref 30–100)

## 2020-01-10 LAB — VITAMIN B12: Vitamin B-12: >2000 pg/mL — ABNORMAL HIGH (ref 200–1100)

## 2020-01-10 LAB — TSH: TSH: 1.97 mIU/L (ref 0.40–4.50)

## 2020-01-11 ENCOUNTER — Other Ambulatory Visit: Payer: Self-pay | Admitting: Family Medicine

## 2020-01-11 DIAGNOSIS — E785 Hyperlipidemia, unspecified: Secondary | ICD-10-CM

## 2020-01-14 ENCOUNTER — Other Ambulatory Visit: Payer: Self-pay | Admitting: *Deleted

## 2020-01-14 DIAGNOSIS — E785 Hyperlipidemia, unspecified: Secondary | ICD-10-CM

## 2020-01-14 DIAGNOSIS — M25551 Pain in right hip: Secondary | ICD-10-CM

## 2020-01-14 DIAGNOSIS — I10 Essential (primary) hypertension: Secondary | ICD-10-CM

## 2020-01-14 DIAGNOSIS — E559 Vitamin D deficiency, unspecified: Secondary | ICD-10-CM

## 2020-01-14 DIAGNOSIS — E538 Deficiency of other specified B group vitamins: Secondary | ICD-10-CM

## 2020-01-14 MED ORDER — VITAMIN D (ERGOCALCIFEROL) 1.25 MG (50000 UNIT) PO CAPS: 50000.0000 [IU] | ORAL_CAPSULE | ORAL | 0 refills | Status: DC

## 2020-01-16 IMAGING — DX DG HIP (WITH OR WITHOUT PELVIS) 2-3V*R*
3 series · 3 of 3 positions shown · non-contrast
Comparison: None.

CLINICAL DATA: Aching of the right hip over the last year, no
injury

EXAM:
DG HIP (WITH OR WITHOUT PELVIS) 2-3V RIGHT

[pelvis ap]
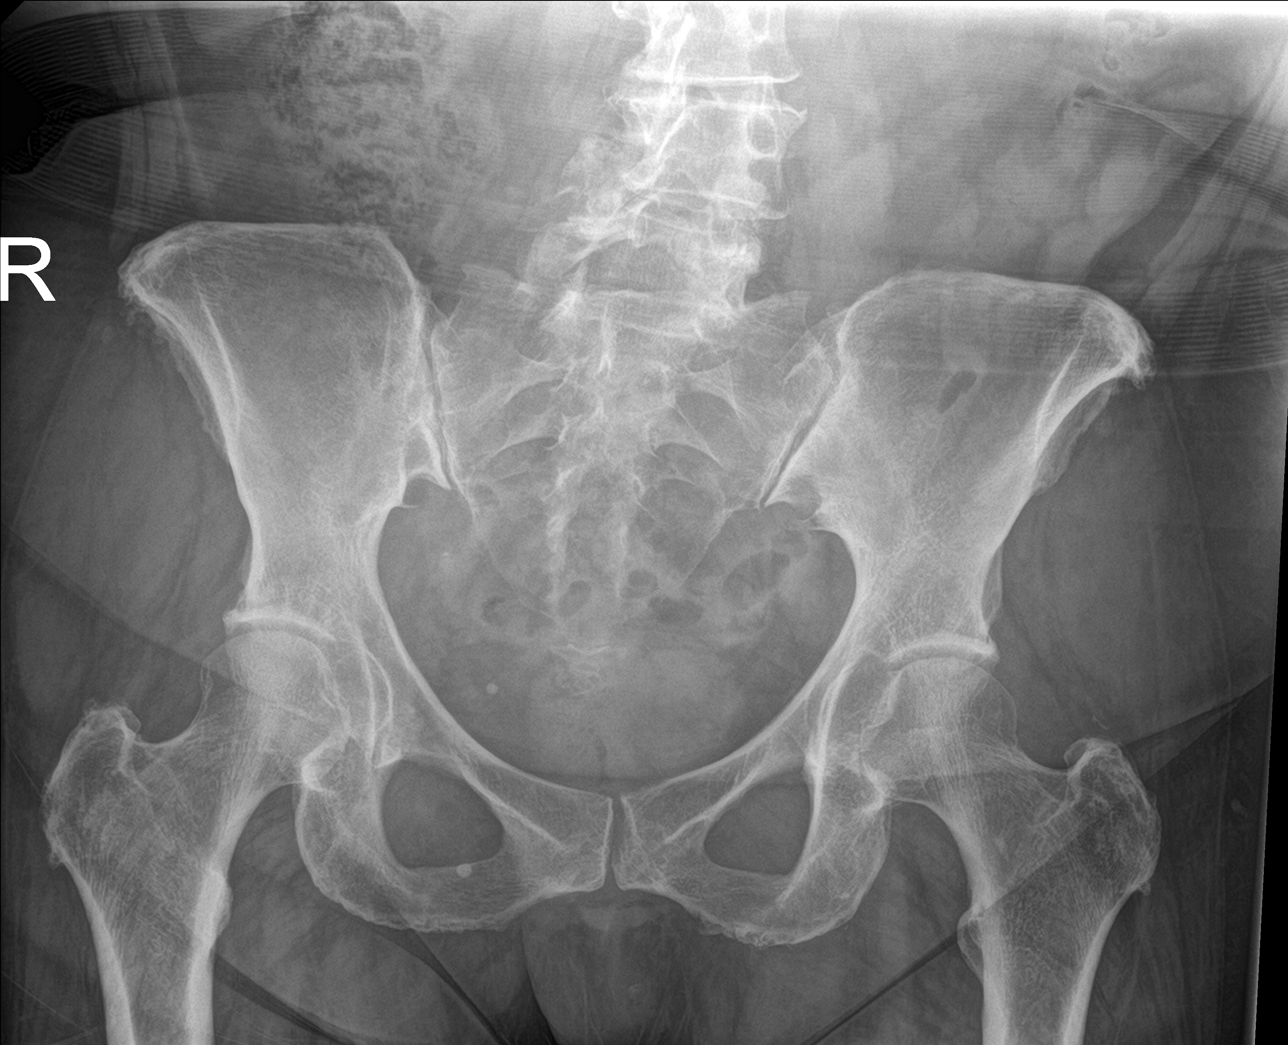

[hip ap]
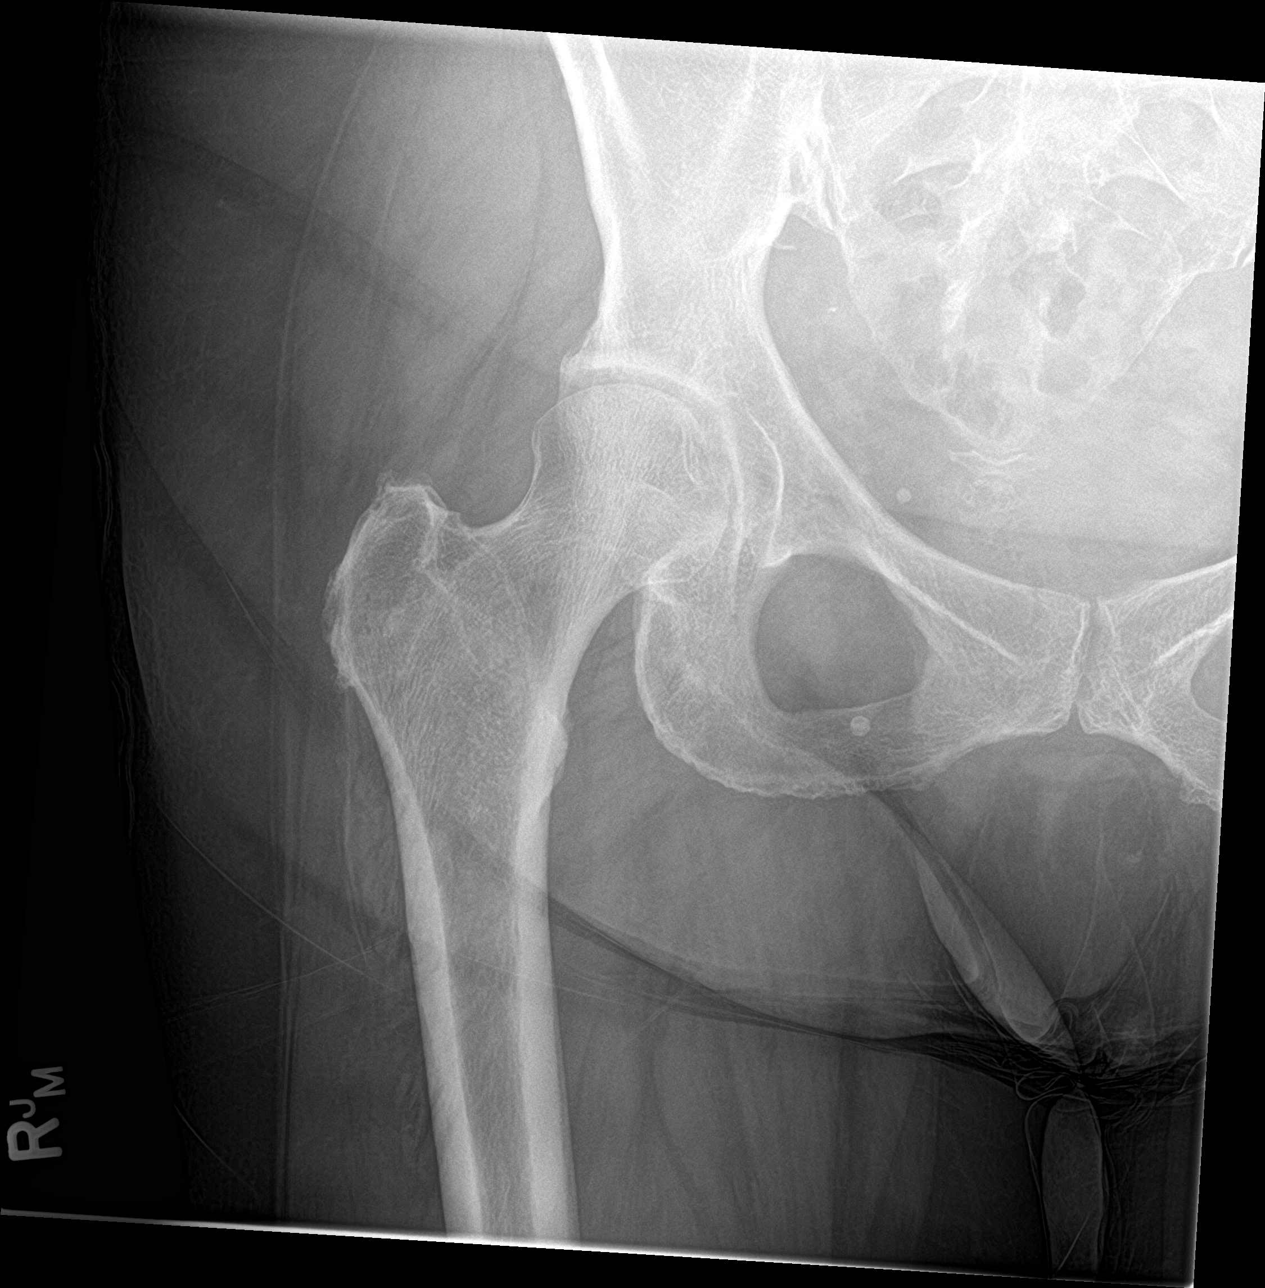

[hip lat]
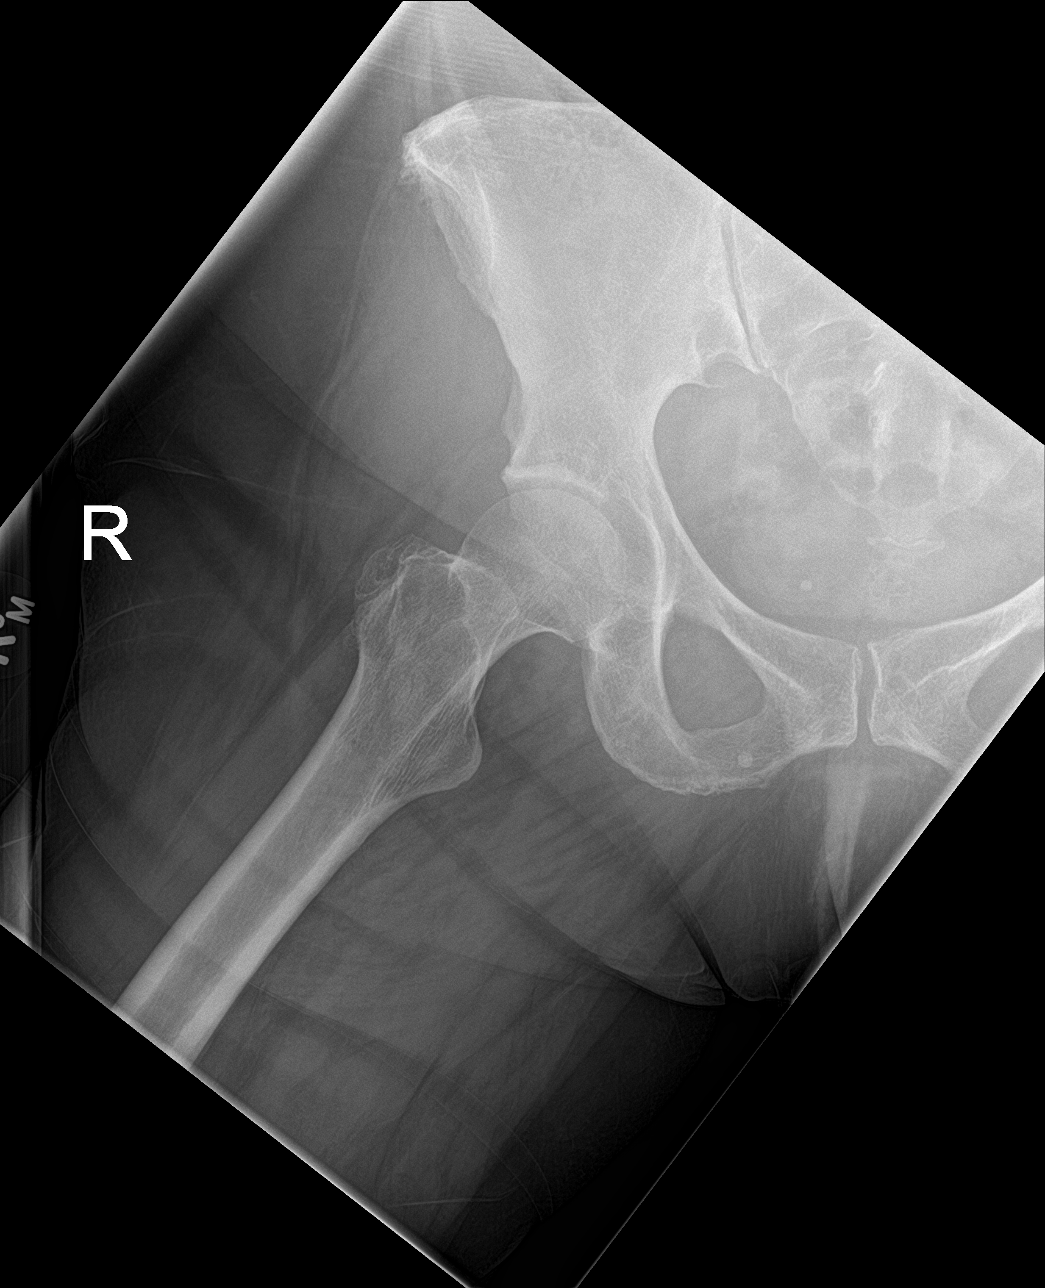

[3 of 3 positions shown; findings below may reference images not displayed]

FINDINGS: The hip joint spaces appear normal with very little degenerative
joint disease. The pelvic rami are intact. The SI joints appear
corticated. There might be a mild lower lumbar spine curvature
present.
IMPRESSION: 1. Negative views of the hips.
2. Suspect mild curvature of the lumbar spine.

## 2020-01-21 ENCOUNTER — Encounter: Payer: Self-pay | Admitting: Family Medicine

## 2020-01-21 ENCOUNTER — Other Ambulatory Visit: Payer: Self-pay

## 2020-01-21 ENCOUNTER — Ambulatory Visit: Payer: BC Managed Care – PPO | Admitting: Family Medicine

## 2020-01-21 DIAGNOSIS — M533 Sacrococcygeal disorders, not elsewhere classified: Secondary | ICD-10-CM

## 2020-01-21 NOTE — Assessment & Plan Note (Addendum)
Has pain on exam and weakness with hip abduction.  Has some dysfunction of the SI joint with instability which is likely related to her pain. -Counseled on home exercise therapy and supportive care. -Referral to physical therapy. -Counseled on Motrin. -Could consider imaging or physical therapy.

## 2020-01-21 NOTE — Progress Notes (Signed)
Tami Monroe - 59 y.o. female MRN [id number]  Date of birth: 08-Aug-1960  SUBJECTIVE:  Including CC & ROS.  Chief Complaint  Patient presents with  . Hip Pain    right     Tami Monroe is a 59 y.o. female that is presenting with right hip pain.  The pain has been ongoing for weeks.  The pain is localized over the lateral hip as well as the gluteus.  It is worse with getting up from a seated position.  It is worse with walking.  No radicular symptoms.  No history of surgery.  Seems to be staying the same.  Is worse with going up and down stairs..  Independent review of the lumbar spine from 10/28 shows mild degenerative changes.   Review of Systems See HPI   HISTORY: Past Medical, Surgical, Social, and Family History Reviewed & Updated per EMR.   Pertinent Historical Findings include:  Past Medical History:  Diagnosis Date  . Allergy   . Arthritis    HIP,KNEE  . Constipation   . Hyperlipidemia   . Hypertension   . Joint pain     Past Surgical History:  Procedure Laterality Date  . CHOLECYSTECTOMY  2011    Family History  Problem Relation Age of Onset  . Arthritis Mother   . Hyperlipidemia Mother   . Heart disease Mother   . Hypertension Mother   . Diabetes Mother   . Bladder Cancer Mother 32  . Depression Mother   . Obesity Mother   . Lung cancer Father        smoker  . Alcohol abuse Father   . Obesity Father   . Cancer Other        Ureter cancer  . Diabetes Sister 33  . Vision loss Sister        optic nerve stroke  . Stroke Sister   . Diabetes Mellitus II Brother 49  . Heart disease Brother   . Crohn's disease Brother 56  . Colon cancer Neg Hx   . Colon polyps Neg Hx   . Esophageal cancer Neg Hx   . Rectal cancer Neg Hx   . Stomach cancer Neg Hx     Social History   Socioeconomic History  . Marital status: Single    Spouse name: Not on file  . Number of children: Not on file  . Years of education: Not on file  . Highest education level: Not on  file  Occupational History  . Occupation: Freight forwarder in Surveyor, minerals: [employer]  Tobacco Use  . Smoking status: Never Smoker  . Smokeless tobacco: Never Used  Vaping Use  . Vaping Use: Never used  Substance and Sexual Activity  . Alcohol use: Yes    Comment: OCC  . Drug use: No  . Sexual activity: Not Currently    Partners: Male  Other Topics Concern  . Not on file  Social History Narrative   -Exercise---  no   Social Determinants of Health   Financial Resource Strain:   . Difficulty of Paying Living Expenses: Not on file  Food Insecurity:   . Worried About Charity fundraiser in the Last Year: Not on file  . Ran Out of Food in the Last Year: Not on file  Transportation Needs:   . Lack of Transportation (Medical): Not on file  . Lack of Transportation (Non-Medical): Not on file  Physical Activity:   . Days of Exercise per  Week: Not on file  . Minutes of Exercise per Session: Not on file  Stress:   . Feeling of Stress : Not on file  Social Connections:   . Frequency of Communication with Friends and Family: Not on file  . Frequency of Social Gatherings with Friends and Family: Not on file  . Attends Religious Services: Not on file  . Active Member of Clubs or Organizations: Not on file  . Attends Archivist Meetings: Not on file  . Marital Status: Not on file  Intimate Partner Violence:   . Fear of Current or Ex-Partner: Not on file  . Emotionally Abused: Not on file  . Physically Abused: Not on file  . Sexually Abused: Not on file     PHYSICAL EXAM:  VS: BP 128/86   Pulse 65   Ht 5\' 5"  (1.651 m)   Wt 200 lb (90.7 kg)   BMI 33.28 kg/m  Physical Exam Gen: NAD, alert, cooperative with exam, well-appearing MSK:  Right hip: Instability with hip flexion and abduction. Normal strength with hip flexion. Weakness with hip abduction. Tenderness palpation over the SI joint. Tenderness palpation of the greater trochanter. Negative  straight leg raise. Neurovascularly intact     ASSESSMENT & PLAN:   SI (sacroiliac) joint dysfunction Has pain on exam and weakness with hip abduction.  Has some dysfunction of the SI joint with instability which is likely related to her pain. -Counseled on home exercise therapy and supportive care. -Referral to physical therapy. -Counseled on Motrin. -Could consider imaging or physical therapy.

## 2020-01-21 NOTE — Patient Instructions (Signed)
Nice to meet you Please try heat  Please try the exercises  Physical therapy will give you a call   Please send me a message in [place] with any questions or updates.  Please see me back in 4 weeks.   --Dr. Raeford Razor

## 2020-01-28 DIAGNOSIS — Z1231 Encounter for screening mammogram for malignant neoplasm of breast: Secondary | ICD-10-CM | POA: Diagnosis not present

## 2020-01-28 LAB — HM MAMMOGRAPHY

## 2020-02-04 ENCOUNTER — Ambulatory Visit: Payer: BC Managed Care – PPO | Attending: Family Medicine | Admitting: Physical Therapy

## 2020-02-04 ENCOUNTER — Other Ambulatory Visit: Payer: Self-pay

## 2020-02-04 ENCOUNTER — Encounter: Payer: Self-pay | Admitting: Physical Therapy

## 2020-02-04 DIAGNOSIS — G8929 Other chronic pain: Secondary | ICD-10-CM | POA: Diagnosis not present

## 2020-02-04 DIAGNOSIS — R262 Difficulty in walking, not elsewhere classified: Secondary | ICD-10-CM | POA: Insufficient documentation

## 2020-02-04 DIAGNOSIS — M25551 Pain in right hip: Secondary | ICD-10-CM

## 2020-02-04 DIAGNOSIS — M545 Low back pain, unspecified: Secondary | ICD-10-CM | POA: Insufficient documentation

## 2020-02-04 NOTE — Therapy (Signed)
[facility] [address]  [place], [place], [address] Phone: [phone]   Fax:  [phone]  Physical Therapy Evaluation  Patient Details  Name: Tami Monroe MRN: [id number] Date of Birth: 11-24-1960 Referring Provider (PT): Clearance Coots, MD   Encounter Date: 02/04/2020   PT End of Session - 02/04/20 1701    Visit Number 1    Number of Visits 7    Date for PT Re-Evaluation 03/17/20    Authorization Type BCBS    PT Start Time 9323    PT Stop Time 1608    PT Time Calculation (min) 33 min    Activity Tolerance Patient tolerated treatment well    Behavior During Therapy [facility] for tasks assessed/performed           Past Medical History:  Diagnosis Date  . Allergy   . Arthritis    HIP,KNEE  . Constipation   . Hyperlipidemia   . Hypertension   . Joint pain     Past Surgical History:  Procedure Laterality Date  . CHOLECYSTECTOMY  2011    There were no vitals filed for this visit.    Subjective Assessment - 02/04/20 1536    Subjective Patient reports discomfort in the R hip for the past couple of years, with recent worsening in the past year. Had been working from home for the past 2 years and was sitting on a soft bench, feels that this aggravated her pain. Feels unstable when going up stairs, stiffness when standing up after prolonged sitting, and has pain at night. Reports that she hasn't been working as much recently, and has been moving more as a result and notes some benefit in her pain. Pain is located over the R anterior hip with radiation down the anterior thigh. Denies N/T or changes in B&B control.    Pertinent History HTN, HLD    Limitations Sitting;Lifting;Standing;Walking;House hold activities    How long can you stand comfortably? 1 hour    How long can you walk comfortably? 1 hour    Diagnostic tests 01/11/20 lumbar xray: Mild degenerative changes of the lower lumbar spine.    Patient Stated  Goals decrease pain    Currently in Pain? Yes    Pain Score 0-No pain    Pain Location Hip    Pain Orientation Right;Anterior    Pain Descriptors / Indicators Aching    Pain Type Chronic pain    Pain Radiating Towards anterior thigh              [facility] PT Assessment - 02/04/20 1542      Assessment   Medical Diagnosis SIJ Dysfunction    Referring Provider (PT) Clearance Coots, MD    Onset Date/Surgical Date 02/03/18    Next MD Visit not scheduled    Prior Therapy no      Precautions   Precautions None      Balance Screen   Has the patient fallen in the past 6 months No    Has the patient had a decrease in activity level because of a fear of falling?  No    Is the patient reluctant to leave their home because of a fear of falling?  No      Home Environment   Living Environment Private residence    Living Arrangements Alone    Available Help at Discharge Family    Type of [place] to enter  Entrance Stairs-Number of Steps 1    Entrance Stairs-Rails None    Home Layout Two level    Alternate Level Stairs-Number of Steps 18    Alternate Level Stairs-Rails Right    Home Equipment None      Prior Function   Level of Independence Independent    Vocation Full time employment    Vocation Requirements retiring in 2022, office work    Leisure walking      Cognition   Overall Cognitive Status Within Functional Limits for tasks assessed      Sensation   Light Touch Appears Intact      Coordination   Gross Motor Movements are Fluid and Coordinated Yes      Posture/Postural Control   Posture/Postural Control Postural limitations    Postural Limitations Rounded Shoulders      ROM / Strength   AROM / PROM / Strength Strength;AROM      AROM   AROM Assessment Site Lumbar;Hip    Right/Left Hip Right;Left    Right Hip Flexion 122    Right Hip External Rotation  30    Right Hip Internal Rotation  28    Left Hip Flexion 110    Left Hip External  Rotation  25    Left Hip Internal Rotation  24    Lumbar Flexion mid shin   discomfort   Lumbar Extension moderately limited   discomfort   Lumbar - Right Side Bend mid thigh   discomfort   Lumbar - Left Side Bend mid thigh   discomfort   Lumbar - Right Rotation WFL    Lumbar - Left Rotation [facility]      Strength   Strength Assessment Site Hip;Knee;Ankle    Right/Left Hip Right;Left    Right Hip Flexion 4+/5    Right Hip External Rotation  4+/5    Right Hip Internal Rotation 4+/5    Right Hip ABduction 4/5   cramping   Right Hip ADduction 4+/5    Left Hip Flexion 4+/5    Left Hip External Rotation 4+/5    Left Hip Internal Rotation 4+/5    Left Hip ABduction 4/5    Left Hip ADduction 4+/5    Right/Left Knee Right;Left    Right Knee Flexion 4+/5    Right Knee Extension 5/5    Left Knee Flexion 4+/5    Left Knee Extension 5/5    Right/Left Ankle Right;Left    Right Ankle Dorsiflexion 5/5    Right Ankle Plantar Flexion 4+/5    Left Ankle Dorsiflexion 5/5    Left Ankle Plantar Flexion 4+/5      Flexibility   Soft Tissue Assessment /Muscle Length yes    ITB positive Ober's B      Palpation   SI assessment  B ASIS and PSIS in normal alignment    Palpation comment TTP over R lateral hip musculature, B PSIS, R sacral ala, R hip flexors      Ambulation/Gait   Gait Pattern Step-through pattern;Lateral trunk lean to right;Lateral trunk lean to left;Lateral hip instability    Ambulation Surface Level;Indoor    Gait velocity slightly decreased                      Objective measurements completed on examination: See above findings.               PT Education - 02/04/20 1701    Education Details prognosis, POC, HEP  Person(s) Educated Patient    Methods Explanation;Demonstration;Tactile cues;Verbal cues;Handout    Comprehension Verbalized understanding;Returned demonstration            PT Short Term Goals - 02/04/20 1753      PT SHORT TERM GOAL #1    Title Patient to be independent with initial HEP.    Time 2    Period Weeks    Status New    Target Date 02/18/20             PT Long Term Goals - 02/04/20 1754      PT LONG TERM GOAL #1   Title Patient to be independent with advanced HEP.    Time 6    Period Weeks    Status New    Target Date 03/17/20      PT LONG TERM GOAL #2   Title Patient to demonstrate B hip abduction strength >/=4+/5.    Time 6    Period Weeks    Status New    Target Date 03/17/20      PT LONG TERM GOAL #3   Title Patient to demonstrate lumbar AROM WFL and without pain limiting.    Time 6    Period Weeks    Status New    Target Date 03/17/20      PT LONG TERM GOAL #4   Title Patient to demonstrate alternating reciprocal stair climbing pattern with good hip stability and 0/10 pain.    Time 6    Period Weeks    Status New    Target Date 03/17/20      PT LONG TERM GOAL #5   Title Patient to report 75% improvement in pain levels.    Time 6    Period Weeks    Status New    Target Date 03/17/20                  Plan - 02/04/20 1701    Clinical Impression Statement Patient is a 59 y/o F presenting to OPPT with c/o chronic insidious R hip pain for the past 2 years. Pain occurs over the R anterior hip with radiation down the anterior thigh. Notes instability when ascending stairs, stiffness when standing up after prolonged sitting, and in PM. Denies N/T or B&B changes. Patient attributes onset of her pain to decreases activity while working from home. Patient today presenting with rounded shoulder, limited and painful lumbar AROM, nonpainful B hip AROM, B hip abductor weakness, B positive Ober's test, TTP over R lateral hip musculature, B PSIS, R sacral ala, R hip flexors, and gait deviations. Patient was educated on gentle strengthening HEP- patient reported understanding. Would benefit from skilled PT services 1x/week for 6 weeks to address aforementioned impairments.    Personal Factors  and Comorbidities Age;Fitness;Past/Current Experience;Profession;Time since onset of injury/illness/exacerbation;Comorbidity 2    Comorbidities HTN, HLD    Examination-Activity Limitations Sit;Bend;Squat;Stairs;Carry;Stand;Toileting;Dressing;Transfers;Hygiene/Grooming;Lift;Reach Overhead;Locomotion Level    Examination-Participation Restrictions Church;Cleaning;Shop;Community Activity;Driving;Yard Work;Laundry;Meal Prep;Occupation    Stability/Clinical Decision Making Stable/Uncomplicated    Clinical Decision Making Low    Rehab Potential Good    PT Frequency 1x / week    PT Duration 6 weeks    PT Treatment/Interventions ADLs/Self Care Home Management;Cryotherapy;Electrical Stimulation;Iontophoresis 4mg /ml Dexamethasone;Moist Heat;Traction;Balance training;Therapeutic exercise;Therapeutic activities;Functional mobility training;Stair training;Gait training;Ultrasound;Neuromuscular re-education;Patient/family education;Manual techniques;Taping;Energy conservation;Dry needling;Passive range of motion    PT Next Visit Plan hip FOTO, reassess HEP, progress lumbar ROM and hip ABD strength    Consulted and Agree with Plan of  Care Patient           Patient will benefit from skilled therapeutic intervention in order to improve the following deficits and impairments:  Hypomobility, Decreased activity tolerance, Decreased strength, Increased fascial restricitons, Pain, Increased muscle spasms, Decreased range of motion, Improper body mechanics, Postural dysfunction, Impaired flexibility  Visit Diagnosis: Pain in right hip  Chronic low back pain without sciatica, unspecified back pain laterality  Difficulty in walking, not elsewhere classified     Problem List Patient Active Problem List   Diagnosis Date Noted  . Vitamin D deficiency 05/15/2018  . Essential hypertension 04/04/2018  . SI (sacroiliac) joint dysfunction 01/04/2018  . Lower extremity edema 01/31/2016  . Class 1 obesity due to  excess calories with serious comorbidity and body mass index (BMI) of 31.0 to 31.9 in adult 01/31/2016     Janene Harvey, PT, DPT 02/04/20 5:57 PM   [facility] [address]  [place], [place], [address] Phone: [phone]   Fax:  [phone]  Name: Tami Monroe MRN: [id number] Date of Birth: 01-30-1961

## 2020-02-12 ENCOUNTER — Other Ambulatory Visit: Payer: Self-pay

## 2020-02-12 ENCOUNTER — Encounter: Payer: Self-pay | Admitting: Physical Therapy

## 2020-02-12 ENCOUNTER — Ambulatory Visit: Payer: BC Managed Care – PPO | Attending: Sports Medicine | Admitting: Physical Therapy

## 2020-02-12 DIAGNOSIS — G8929 Other chronic pain: Secondary | ICD-10-CM | POA: Insufficient documentation

## 2020-02-12 DIAGNOSIS — M25551 Pain in right hip: Secondary | ICD-10-CM | POA: Diagnosis not present

## 2020-02-12 DIAGNOSIS — M545 Low back pain, unspecified: Secondary | ICD-10-CM | POA: Diagnosis present

## 2020-02-12 DIAGNOSIS — R262 Difficulty in walking, not elsewhere classified: Secondary | ICD-10-CM | POA: Diagnosis present

## 2020-02-12 NOTE — Therapy (Signed)
[facility] [address]  [place], [place], [address] Phone: [phone]   Fax:  [phone]  Physical Therapy Treatment  Patient Details  Name: Tami Monroe MRN: [id number] Date of Birth: 1960/10/05 Referring Provider (PT): Clearance Coots, MD   Encounter Date: 02/12/2020   PT End of Session - 02/12/20 1735    Visit Number 2    Number of Visits 7    Date for PT Re-Evaluation 03/17/20    Authorization Type BCBS    PT Start Time 1647    PT Stop Time 7341    PT Time Calculation (min) 47 min    Activity Tolerance Patient tolerated treatment well    Behavior During Therapy [facility] for tasks assessed/performed           Past Medical History:  Diagnosis Date  . Allergy   . Arthritis    HIP,KNEE  . Constipation   . Hyperlipidemia   . Hypertension   . Joint pain     Past Surgical History:  Procedure Laterality Date  . CHOLECYSTECTOMY  2011    There were no vitals filed for this visit.   Subjective Assessment - 02/12/20 1649    Subjective Feels like the exercise where she kicks her leg out is really hard on her R hip.    Pertinent History HTN, HLD    Diagnostic tests 01/11/20 lumbar xray: Mild degenerative changes of the lower lumbar spine.    Patient Stated Goals decrease pain    Currently in Pain? Yes    Pain Score 2     Pain Location --   thigh   Pain Orientation Right;Anterior    Pain Descriptors / Indicators Discomfort    Pain Type Chronic pain              [facility] PT Assessment - 02/12/20 0001      Observation/Other Assessments   Focus on Therapeutic Outcomes (FOTO)  Hip: 65 (35% limited, 26%)                         [facility] Adult PT Treatment/Exercise - 02/12/20 0001      Self-Care   Self-Care Other Self-Care Comments    Other Self-Care Comments  edu and practice using self-STM to R buttock      Exercises   Exercises Knee/Hip;Lumbar      Lumbar Exercises: Aerobic   Nustep  L4 x 6 min (UEs/LEs)      Lumbar Exercises: Sidelying   Other Sidelying Lumbar Exercises open book stretch R/L 10x    cueing to perform to end range     Knee/Hip Exercises: Stretches   ITB Stretch Right;2 reps;30 seconds    ITB Stretch Limitations supine with strap    Other Knee/Hip Stretches R/L modified ER stretch + forward lean 2x30"   mmore intense on L     Knee/Hip Exercises: Supine   Bridges Strengthening;Both;1 set;10 reps    Bridges Limitations straight leg bridge   cues to avoid lumbar extension   Bridges with Clamshell Strengthening;Both;1 set;10 reps   red TB above knees     Knee/Hip Exercises: Sidelying   Hip ABduction Strengthening;Right;1 set;10 reps    Hip ABduction Limitations no pain    Clams 2x10 each side with yellow TB   verbal and manual cues to avoid posterior hip rotation     Manual Therapy   Manual Therapy Soft tissue mobilization;Myofascial release  Manual therapy comments prone    Soft tissue mobilization STM to R proximal and lateral glutes, piriformis    Myofascial Release manual TPR to R proximal and lateral glutes   palpable tender trigger pts                 PT Education - 02/12/20 1735    Education Details update to HEP    Person(s) Educated Patient    Methods Explanation;Demonstration;Tactile cues;Verbal cues;Handout    Comprehension Verbalized understanding;Returned demonstration            PT Short Term Goals - 02/12/20 1739      PT SHORT TERM GOAL #1   Title Patient to be independent with initial HEP.    Time 2    Period Weeks    Status Achieved    Target Date 02/18/20             PT Long Term Goals - 02/12/20 1739      PT LONG TERM GOAL #1   Title Patient to be independent with advanced HEP.    Time 6    Period Weeks    Status On-going      PT LONG TERM GOAL #2   Title Patient to demonstrate B hip abduction strength >/=4+/5.    Time 6    Period Weeks    Status On-going      PT LONG TERM GOAL #3   Title  Patient to demonstrate lumbar AROM WFL and without pain limiting.    Time 6    Period Weeks    Status On-going      PT LONG TERM GOAL #4   Title Patient to demonstrate alternating reciprocal stair climbing pattern with good hip stability and 0/10 pain.    Time 6    Period Weeks    Status On-going      PT LONG TERM GOAL #5   Title Patient to report 75% improvement in pain levels.    Time 6    Period Weeks    Status On-going                 Plan - 02/12/20 1736    Clinical Impression Statement Patient noting difficulty performing hip abduction as part of her HEP. Reassessed this exercise with patient reporting no pain today- advised to decrease reps if pain continues at home. Initiated clamshell with patient requiring manual cues to maintain hips from rotation posteriorly, with better form on L vs. R. Also initiated gentle thoracolumbar ROM with patient demonstrating limitation and requiring cueing to achieve max stretch. Reviewed lateral hip stretching with good benefit from modified ER stretch. Ended session with STM and manual TPR to R proximal and lateral glutes and piriformis with tenderness and trigger points evident. Educated patient on self-STM to address soft tissue restriction at home. Patient reported understanding and without complaints at end of session.    Comorbidities HTN, HLD    PT Treatment/Interventions ADLs/Self Care Home Management;Cryotherapy;Electrical Stimulation;Iontophoresis 4mg /ml Dexamethasone;Moist Heat;Traction;Balance training;Therapeutic exercise;Therapeutic activities;Functional mobility training;Stair training;Gait training;Ultrasound;Neuromuscular re-education;Patient/family education;Manual techniques;Taping;Energy conservation;Dry needling;Passive range of motion    PT Next Visit Plan reassess HEP, progress lumbar ROM and hip ABD strength    Consulted and Agree with Plan of Care Patient           Patient will benefit from skilled therapeutic  intervention in order to improve the following deficits and impairments:  Hypomobility, Decreased activity tolerance, Decreased strength, Increased fascial restricitons, Pain, Increased muscle spasms, Decreased  range of motion, Improper body mechanics, Postural dysfunction, Impaired flexibility  Visit Diagnosis: Pain in right hip  Chronic low back pain without sciatica, unspecified back pain laterality  Difficulty in walking, not elsewhere classified     Problem List Patient Active Problem List   Diagnosis Date Noted  . Vitamin D deficiency 05/15/2018  . Essential hypertension 04/04/2018  . SI (sacroiliac) joint dysfunction 01/04/2018  . Lower extremity edema 01/31/2016  . Class 1 obesity due to excess calories with serious comorbidity and body mass index (BMI) of 31.0 to 31.9 in adult 01/31/2016     Janene Harvey, PT, DPT 02/12/20 5:40 PM   [facility] [address]  [place], [place], [address] Phone: [phone]   Fax:  [phone]  Name: Tami Monroe MRN: [id number] Date of Birth: July 04, 1960

## 2020-02-18 ENCOUNTER — Ambulatory Visit: Payer: BC Managed Care – PPO

## 2020-02-18 ENCOUNTER — Other Ambulatory Visit: Payer: Self-pay

## 2020-02-18 DIAGNOSIS — G8929 Other chronic pain: Secondary | ICD-10-CM

## 2020-02-18 DIAGNOSIS — R262 Difficulty in walking, not elsewhere classified: Secondary | ICD-10-CM

## 2020-02-18 DIAGNOSIS — M25551 Pain in right hip: Secondary | ICD-10-CM | POA: Diagnosis not present

## 2020-02-18 DIAGNOSIS — M545 Low back pain, unspecified: Secondary | ICD-10-CM

## 2020-02-18 NOTE — Therapy (Signed)
[facility] [address]  [place], [place], [address] Phone: [phone]   Fax:  [phone]  Physical Therapy Treatment  Patient Details  Name: Tami Monroe MRN: [id number] Date of Birth: 1960/06/21 Referring Provider (PT): Clearance Coots, MD   Encounter Date: 02/18/2020   PT End of Session - 02/18/20 1544    Visit Number 3    Number of Visits 7    Date for PT Re-Evaluation 03/17/20    Authorization Type BCBS    PT Start Time 5329    PT Stop Time 1620    PT Time Calculation (min) 42 min    Activity Tolerance Patient tolerated treatment well    Behavior During Therapy [facility] for tasks assessed/performed           Past Medical History:  Diagnosis Date  . Allergy   . Arthritis    HIP,KNEE  . Constipation   . Hyperlipidemia   . Hypertension   . Joint pain     Past Surgical History:  Procedure Laterality Date  . CHOLECYSTECTOMY  2011    There were no vitals filed for this visit.   Subjective Assessment - 02/18/20 1543    Subjective Pt. noting most pain in R lateral/anterior hip when she climbs stairs.    Pertinent History HTN, HLD    Diagnostic tests 01/11/20 lumbar xray: Mild degenerative changes of the lower lumbar spine.    Patient Stated Goals decrease pain    Currently in Pain? No/denies    Pain Score 0-No pain   pain up to a 6-7/10 at most at night   Pain Orientation Right;Anterior;Lateral    Pain Descriptors / Indicators Discomfort    Pain Type Chronic pain    Multiple Pain Sites No                             [facility] Adult PT Treatment/Exercise - 02/18/20 0001      Lumbar Exercises: Stretches   Lower Trunk Rotation Limitations 5" x 10    Piriformis Stretch Right;2 reps;30 seconds    Piriformis Stretch Limitations R seated and supine     Figure 4 Stretch 2 reps;30 seconds    Figure 4 Stretch Limitations seated and supine       Lumbar Exercises: Aerobic   Nustep L4 x 6  min (UEs/LEs)      Knee/Hip Exercises: Stretches   ITB Stretch Left;Right;2 reps;30 seconds    ITB Stretch Limitations supine with strap    Other Knee/Hip Stretches R/L modified ER stretch + forward lean 30"   alternative provided as pt. having some difficulty      Knee/Hip Exercises: Standing   Functional Squat 10 reps    Functional Squat Limitations red TB at knees       Knee/Hip Exercises: Supine   Bridges Both;15 reps    Bridges Limitations cues for neutral lumbar spine and ROM     Bridges with [employer] Both;10 reps      Knee/Hip Exercises: Sidelying   Clams x 10 B manual cueing to avoid hip rotation                   PT Education - 02/18/20 1638    Education Details HEP update    Person(s) Educated Patient    Methods Explanation;Demonstration;Handout;Verbal cues    Comprehension Verbalized understanding;Returned demonstration;Verbal cues required  PT Short Term Goals - 02/12/20 1739      PT SHORT TERM GOAL #1   Title Patient to be independent with initial HEP.    Time 2    Period Weeks    Status Achieved    Target Date 02/18/20             PT Long Term Goals - 02/12/20 1739      PT LONG TERM GOAL #1   Title Patient to be independent with advanced HEP.    Time 6    Period Weeks    Status On-going      PT LONG TERM GOAL #2   Title Patient to demonstrate B hip abduction strength >/=4+/5.    Time 6    Period Weeks    Status On-going      PT LONG TERM GOAL #3   Title Patient to demonstrate lumbar AROM WFL and without pain limiting.    Time 6    Period Weeks    Status On-going      PT LONG TERM GOAL #4   Title Patient to demonstrate alternating reciprocal stair climbing pattern with good hip stability and 0/10 pain.    Time 6    Period Weeks    Status On-going      PT LONG TERM GOAL #5   Title Patient to report 75% improvement in pain levels.    Time 6    Period Weeks    Status On-going                 Plan -  02/18/20 1550    Clinical Impression Statement Belkys doing well.  Primary complaint is hip pain when going up stairs leading with R LE.  progressed hip abduction strengthening along with provided alternatives to initial HEP glute stretch as pt. having difficulty "sinking in" to couch.  HEP updated included alternative glute stretching along with squat/band abduction with red TB.  Ended visit pain free.    Comorbidities HTN, HLD    Rehab Potential Good    PT Frequency 1x / week    PT Duration 6 weeks    PT Treatment/Interventions ADLs/Self Care Home Management;Cryotherapy;Electrical Stimulation;Iontophoresis 4mg /ml Dexamethasone;Moist Heat;Traction;Balance training;Therapeutic exercise;Therapeutic activities;Functional mobility training;Stair training;Gait training;Ultrasound;Neuromuscular re-education;Patient/family education;Manual techniques;Taping;Energy conservation;Dry needling;Passive range of motion    PT Next Visit Plan Progress lumbar ROM and hip ABD strength    Consulted and Agree with Plan of Care Patient           Patient will benefit from skilled therapeutic intervention in order to improve the following deficits and impairments:  Hypomobility, Decreased activity tolerance, Decreased strength, Increased fascial restricitons, Pain, Increased muscle spasms, Decreased range of motion, Improper body mechanics, Postural dysfunction, Impaired flexibility  Visit Diagnosis: Pain in right hip  Chronic low back pain without sciatica, unspecified back pain laterality  Difficulty in walking, not elsewhere classified     Problem List Patient Active Problem List   Diagnosis Date Noted  . Vitamin D deficiency 05/15/2018  . Essential hypertension 04/04/2018  . SI (sacroiliac) joint dysfunction 01/04/2018  . Lower extremity edema 01/31/2016  . Class 1 obesity due to excess calories with serious comorbidity and body mass index (BMI) of 31.0 to 31.9 in adult 01/31/2016    Bess Harvest,  PTA 02/18/20 4:42 PM    [facility] [address]  [place], [place], [address] Phone: [phone]   Fax:  [phone]  Name: Daleysa  Dunlow MRN: [id number] Date of Birth: 10-09-1960

## 2020-02-26 ENCOUNTER — Other Ambulatory Visit: Payer: Self-pay

## 2020-02-26 ENCOUNTER — Ambulatory Visit: Payer: BC Managed Care – PPO

## 2020-02-26 DIAGNOSIS — M545 Low back pain, unspecified: Secondary | ICD-10-CM | POA: Diagnosis not present

## 2020-02-26 DIAGNOSIS — R262 Difficulty in walking, not elsewhere classified: Secondary | ICD-10-CM

## 2020-02-26 DIAGNOSIS — M25551 Pain in right hip: Secondary | ICD-10-CM | POA: Diagnosis not present

## 2020-02-26 DIAGNOSIS — G8929 Other chronic pain: Secondary | ICD-10-CM | POA: Diagnosis not present

## 2020-02-26 NOTE — Therapy (Signed)
[facility] [address]  [place] [place], [place], [address] Phone: [phone]   Fax:  [phone]  Physical Therapy Treatment  Patient Details  Name: Tami Monroe MRN: [id number] Date of Birth: 11-05-1960 Referring Provider (PT): Clearance Coots, MD   Encounter Date: 02/26/2020   PT End of Session - 02/26/20 1713    Visit Number 4    Number of Visits 7    Date for PT Re-Evaluation 03/17/20    Authorization Type BCBS    PT Start Time 1706    PT Stop Time 1745    PT Time Calculation (min) 39 min    Activity Tolerance Patient tolerated treatment well    Behavior During Therapy [facility] for tasks assessed/performed           Past Medical History:  Diagnosis Date  . Allergy   . Arthritis    HIP,KNEE  . Constipation   . Hyperlipidemia   . Hypertension   . Joint pain     Past Surgical History:  Procedure Laterality Date  . CHOLECYSTECTOMY  2011    There were no vitals filed for this visit.   Subjective Assessment - 02/26/20 1710    Subjective Pt. reporting she had the Covid-19 booster shot on Friday afternoon and felt fine with a mild headache.  notes she has not been getting to the HEP exercise, "like she should".    Pertinent History HTN, HLD    Diagnostic tests 01/11/20 lumbar xray: Mild degenerative changes of the lower lumbar spine.    Patient Stated Goals decrease pain    Currently in Pain? Yes    Pain Score 0-No pain   pain rising to 1/10 average and up to a 4/10 at worst   Pain Location Hip    Pain Orientation Right;Anterior;Lateral    Pain Descriptors / Indicators Discomfort;Sharp    Pain Type Chronic pain    Pain Frequency Intermittent    Aggravating Factors  most once getting up, bearing weight on R LE when rising up stairs    Pain Relieving Factors rest    Multiple Pain Sites No                             [facility] Adult PT Treatment/Exercise - 02/26/20 0001      Lumbar  Exercises: Stretches   Lower Trunk Rotation Limitations 5" x 10    Lumbar Stabilization Level 1 3 reps;20 seconds    Lumbar Stabilization Level 1 Limitations R, forwards, L    Figure 4 Stretch 30 seconds;1 rep    Figure 4 Stretch Limitations supine    Other Lumbar Stretch Exercise Childs pose x 20 sec      Lumbar Exercises: Aerobic   Nustep L4 x 6 min (UEs/LEs)      Knee/Hip Exercises: Standing   Hip Abduction Right;Left;10 reps;Knee straight;Stengthening    Abduction Limitations cues to limit ROM to avoid lower back involvement      Knee/Hip Exercises: Supine   Bridges with Clamshell Both;15 reps;Strengthening   green looped TB at knees     Manual Therapy   Manual Therapy Soft tissue mobilization;Myofascial release    Manual therapy comments sidelying    Soft tissue mobilization STM/DTM to R glute, piri, glute med    Myofascial Release Manual TPR to R piri, R glute med  PT Education - 02/26/20 1746    Education Details HEP update; green looped band for bridge    Person(s) Educated Patient    Methods Explanation;Demonstration;Verbal cues;Handout    Comprehension Verbalized understanding;Verbal cues required;Returned demonstration            PT Short Term Goals - 02/12/20 1739      PT SHORT TERM GOAL #1   Title Patient to be independent with initial HEP.    Time 2    Period Weeks    Status Achieved    Target Date 02/18/20             PT Long Term Goals - 02/26/20 1729      PT LONG TERM GOAL #1   Title Patient to be independent with advanced HEP.    Time 6    Period Weeks    Status On-going      PT LONG TERM GOAL #2   Title Patient to demonstrate B hip abduction strength >/=4+/5.    Time 6    Period Weeks    Status On-going      PT LONG TERM GOAL #3   Title Patient to demonstrate lumbar AROM WFL and without pain limiting.    Time 6    Period Weeks    Status On-going      PT LONG TERM GOAL #4   Title Patient to demonstrate  alternating reciprocal stair climbing pattern with good hip stability and 0/10 pain.    Time 6    Period Weeks    Status On-going      PT LONG TERM GOAL #5   Title Patient to report 75% improvement in pain levels.    Time 6    Period Weeks    Status Partially Met   12/15: 45% improvement                Plan - 02/26/20 1714    Clinical Impression Statement Pt. noting ~ 45% improvement in frequency and intensity of pain levels since starting therapy.  LTG #5 partially achieved.  MT addressing R glute med, piriformis tenderness with TPR to this musculature and palpable improved tissue quality.  Progressed lumbopelvic strengthening with green TB issued to pt. for bridge/abduction.  Pt. progressing well toward goals and plans toward retirement over next few weeks with less anticipated desk sitting.    Comorbidities HTN, HLD    Rehab Potential Good    PT Frequency 1x / week    PT Treatment/Interventions ADLs/Self Care Home Management;Cryotherapy;Electrical Stimulation;Iontophoresis 72m/ml Dexamethasone;Moist Heat;Traction;Balance training;Therapeutic exercise;Therapeutic activities;Functional mobility training;Stair training;Gait training;Ultrasound;Neuromuscular re-education;Patient/family education;Manual techniques;Taping;Energy conservation;Dry needling;Passive range of motion    PT Next Visit Plan Progress lumbar ROM and hip ABD strength    Consulted and Agree with Plan of Care Patient           Patient will benefit from skilled therapeutic intervention in order to improve the following deficits and impairments:  Hypomobility,Decreased activity tolerance,Decreased strength,Increased fascial restricitons,Pain,Increased muscle spasms,Decreased range of motion,Improper body mechanics,Postural dysfunction,Impaired flexibility  Visit Diagnosis: Pain in right hip  Chronic low back pain without sciatica, unspecified back pain laterality  Difficulty in walking, not elsewhere  classified     Problem List Patient Active Problem List   Diagnosis Date Noted  . Vitamin D deficiency 05/15/2018  . Essential hypertension 04/04/2018  . SI (sacroiliac) joint dysfunction 01/04/2018  . Lower extremity edema 01/31/2016  . Class 1 obesity due to excess calories with serious comorbidity and body mass  index (BMI) of 31.0 to 31.9 in adult 01/31/2016    Bess Harvest, PTA 02/26/20 5:59 PM   [facility] [address]  [place], [place], [address] Phone: [phone]   Fax:  [phone]  Name: Tami Monroe MRN: [id number] Date of Birth: 04/04/1960

## 2020-03-04 ENCOUNTER — Other Ambulatory Visit: Payer: Self-pay

## 2020-03-04 ENCOUNTER — Ambulatory Visit: Payer: BC Managed Care – PPO

## 2020-03-04 DIAGNOSIS — M25551 Pain in right hip: Secondary | ICD-10-CM | POA: Diagnosis not present

## 2020-03-04 DIAGNOSIS — G8929 Other chronic pain: Secondary | ICD-10-CM | POA: Diagnosis not present

## 2020-03-04 DIAGNOSIS — M545 Low back pain, unspecified: Secondary | ICD-10-CM | POA: Diagnosis not present

## 2020-03-04 DIAGNOSIS — R262 Difficulty in walking, not elsewhere classified: Secondary | ICD-10-CM

## 2020-03-04 NOTE — Therapy (Signed)
[facility] [address]  [place], [place], [address] Phone: [phone]   Fax:  [phone]  Physical Therapy Treatment  Patient Details  Name: Tami Monroe MRN: [id number] Date of Birth: 01/12/1961 Referring Provider (PT): Clearance Coots, MD   Encounter Date: 03/04/2020   PT End of Session - 03/04/20 1731    Visit Number 5    Number of Visits 7    Date for PT Re-Evaluation 03/17/20    Authorization Type BCBS    PT Start Time 1704    PT Stop Time 1752    PT Time Calculation (min) 48 min    Activity Tolerance Patient tolerated treatment well    Behavior During Therapy [facility] for tasks assessed/performed           Past Medical History:  Diagnosis Date  . Allergy   . Arthritis    HIP,KNEE  . Constipation   . Hyperlipidemia   . Hypertension   . Joint pain     Past Surgical History:  Procedure Laterality Date  . CHOLECYSTECTOMY  2011    There were no vitals filed for this visit.   Subjective Assessment - 03/04/20 1711    Subjective Pt. ok today.  Notes some relief from pain last few days.  Had increased pain for two days after last session requiring trunk lean while walking with increased hip pain.    Pertinent History HTN, HLD    Diagnostic tests 01/11/20 lumbar xray: Mild degenerative changes of the lower lumbar spine.    Patient Stated Goals decrease pain    Currently in Pain? No/denies    Pain Score 0-No pain   pain up to a 7/10   Pain Location Hip    Pain Orientation Right;Lateral;Anterior    Pain Descriptors / Indicators Discomfort    Pain Type Chronic pain    Pain Frequency Intermittent    Aggravating Factors  most once getting up, leading with R LE when going up stairs    Pain Relieving Factors rest    Multiple Pain Sites No              [facility] PT Assessment - 03/04/20 0001      Strength   Right/Left Hip Right;Left    Right Hip ABduction 4/5    Left Hip ABduction 4/5                          [facility] Adult PT Treatment/Exercise - 03/04/20 0001      Lumbar Exercises: Stretches   Lower Trunk Rotation Limitations 5" x 10    Hip Flexor Stretch Right;2 reps;30 seconds    Hip Flexor Stretch Limitations strap with modified thomas position   and seated   Lumbar Stabilization Level 1 3 reps;20 seconds    Lumbar Stabilization Level 1 Limitations R, forwards, L   with green TB     Lumbar Exercises: Aerobic   Nustep L4 x 6 min (UEs/LEs)      Knee/Hip Exercises: Supine   Bridges with Clamshell Both;15 reps;Strengthening      Knee/Hip Exercises: Sidelying   Hip ABduction Right;Left;10 reps;Strengthening    Hip ABduction Limitations cues for pacing      Manual Therapy   Manual Therapy Soft tissue mobilization    Soft tissue mobilization STM to R QL                  PT Education - 03/04/20  1758    Education Details HEP update    Person(s) Educated Patient    Methods Explanation;Demonstration;Verbal cues;Handout    Comprehension Verbalized understanding;Returned demonstration;Verbal cues required            PT Short Term Goals - 02/12/20 1739      PT SHORT TERM GOAL #1   Title Patient to be independent with initial HEP.    Time 2    Period Weeks    Status Achieved    Target Date 02/18/20             PT Long Term Goals - 02/26/20 1729      PT LONG TERM GOAL #1   Title Patient to be independent with advanced HEP.    Time 6    Period Weeks    Status On-going      PT LONG TERM GOAL #2   Title Patient to demonstrate B hip abduction strength >/=4+/5.    Time 6    Period Weeks    Status On-going      PT LONG TERM GOAL #3   Title Patient to demonstrate lumbar AROM WFL and without pain limiting.    Time 6    Period Weeks    Status On-going      PT LONG TERM GOAL #4   Title Patient to demonstrate alternating reciprocal stair climbing pattern with good hip stability and 0/10 pain.    Time 6    Period Weeks    Status  On-going      PT LONG TERM GOAL #5   Title Patient to report 75% improvement in pain levels.    Time 6    Period Weeks    Status Partially Met   12/15: 45% improvement                Plan - 03/04/20 1743    Clinical Impression Statement Tami Monroe reporting increased soreness for two days after last visit.  Progressed hip strengthenign with focus on hip abduction strength without issue.  Pt. did have R QL cramping x 2 which was relieved with lumbar stretch and MT.  Ended visit pain free with HEP update.    Comorbidities HTN, HLD    Rehab Potential Good    PT Frequency 1x / week    PT Duration 6 weeks    PT Treatment/Interventions ADLs/Self Care Home Management;Cryotherapy;Electrical Stimulation;Iontophoresis 13m/ml Dexamethasone;Moist Heat;Traction;Balance training;Therapeutic exercise;Therapeutic activities;Functional mobility training;Stair training;Gait training;Ultrasound;Neuromuscular re-education;Patient/family education;Manual techniques;Taping;Energy conservation;Dry needling;Passive range of motion    PT Next Visit Plan Progress lumbar ROM and hip ABD strength    Consulted and Agree with Plan of Care Patient           Patient will benefit from skilled therapeutic intervention in order to improve the following deficits and impairments:  Hypomobility,Decreased activity tolerance,Decreased strength,Increased fascial restricitons,Pain,Increased muscle spasms,Decreased range of motion,Improper body mechanics,Postural dysfunction,Impaired flexibility  Visit Diagnosis: Pain in right hip  Chronic low back pain without sciatica, unspecified back pain laterality  Difficulty in walking, not elsewhere classified     Problem List Patient Active Problem List   Diagnosis Date Noted  . Vitamin D deficiency 05/15/2018  . Essential hypertension 04/04/2018  . SI (sacroiliac) joint dysfunction 01/04/2018  . Lower extremity edema 01/31/2016  . Class 1 obesity due to excess calories  with serious comorbidity and body mass index (BMI) of 31.0 to 31.9 in adult 01/31/2016    MBess Harvest PTA 03/04/20 6:02 PM   [facility]  [facility] [address]  [place], [place], [address] Phone: [phone]   Fax:  [phone]  Name: Tami Monroe MRN: [id number] Date of Birth: [birth year]

## 2020-03-10 ENCOUNTER — Other Ambulatory Visit: Payer: Self-pay

## 2020-03-10 ENCOUNTER — Ambulatory Visit: Payer: BC Managed Care – PPO

## 2020-03-10 DIAGNOSIS — M545 Low back pain, unspecified: Secondary | ICD-10-CM | POA: Diagnosis not present

## 2020-03-10 DIAGNOSIS — R262 Difficulty in walking, not elsewhere classified: Secondary | ICD-10-CM | POA: Diagnosis not present

## 2020-03-10 DIAGNOSIS — G8929 Other chronic pain: Secondary | ICD-10-CM

## 2020-03-10 DIAGNOSIS — M25551 Pain in right hip: Secondary | ICD-10-CM | POA: Diagnosis not present

## 2020-03-10 NOTE — Therapy (Addendum)
[facility] [address]  [place], [place], [address] Phone: [phone]   Fax:  [phone]  Physical Therapy Treatment  Patient Details  Name: Tasfia Vasseur MRN: [id number] Date of Birth: [birth year] Referring Provider (PT): Clearance Coots, MD   Encounter Date: 03/10/2020   PT End of Session - 03/10/20 1711    Visit Number 6    Number of Visits 7    Date for PT Re-Evaluation 03/17/20    Authorization Type BCBS    PT Start Time 1705    PT Stop Time 1743    PT Time Calculation (min) 38 min    Activity Tolerance Patient tolerated treatment well    Behavior During Therapy [facility] for tasks assessed/performed           Past Medical History:  Diagnosis Date  . Allergy   . Arthritis    HIP,KNEE  . Constipation   . Hyperlipidemia   . Hypertension   . Joint pain     Past Surgical History:  Procedure Laterality Date  . CHOLECYSTECTOMY  2011    There were no vitals filed for this visit.   Subjective Assessment - 03/10/20 1718    Subjective Notes improving pain.    Pertinent History HTN, HLD    Diagnostic tests 01/11/20 lumbar xray: Mild degenerative changes of the lower lumbar spine.    Patient Stated Goals decrease pain    Currently in Pain? No/denies    Pain Score 0-No pain    Pain Location Hip    Pain Orientation Left                             [facility] Adult PT Treatment/Exercise - 03/10/20 0001      Lumbar Exercises: Stretches   Lower Trunk Rotation Limitations 5" x 10    Hip Flexor Stretch Right;Left;2 reps;30 seconds    Hip Flexor Stretch Limitations strap with modified thomas position    ITB Stretch Right;Left;1 rep;30 seconds      Lumbar Exercises: Aerobic   Nustep L4 x 6 min (UEs/LEs)      Knee/Hip Exercises: Standing   Hip Abduction Right;Left;10 reps;Knee straight;Stengthening    Abduction Limitations red TB    Lateral Step Up Right;10 reps    Forward Step Up Right;10  reps;Step Height: 6";Hand Hold: 1                  PT Education - 03/10/20 1750    Education Details HEP update    Person(s) Educated Patient    Methods Explanation;Demonstration;Verbal cues;Handout    Comprehension Verbalized understanding;Returned demonstration;Verbal cues required            PT Short Term Goals - 02/12/20 1739      PT SHORT TERM GOAL #1   Title Patient to be independent with initial HEP.    Time 2    Period Weeks    Status Achieved    Target Date 02/18/20             PT Long Term Goals - 03/10/20 1719      PT LONG TERM GOAL #1   Title Patient to be independent with advanced HEP.    Time 6    Period Weeks    Status On-going      PT LONG TERM GOAL #2   Title Patient to demonstrate B hip abduction strength >/=4+/5.    Time  6    Period Weeks    Status On-going      PT LONG TERM GOAL #3   Title Patient to demonstrate lumbar AROM WFL and without pain limiting.    Time 6    Period Weeks    Status On-going      PT LONG TERM GOAL #4   Title Patient to demonstrate alternating reciprocal stair climbing pattern with good hip stability and 0/10 pain.    Time 6    Period Weeks    Status On-going      PT LONG TERM GOAL #5   Title Patient to report 75% improvement in pain levels.    Time 6    Period Weeks    Status Partially Met   12/28: 70% improvement                Plan - 03/10/20 1717    Clinical Impression Statement Avalie reporting improved pain levels.  Notes 70% improvement in overall pain since starting therapy.  Progressing toward LTG #5.  Still with visible lack of hip abduction strength/control with stair navigation thus targeted this deficit with lateral, forward step-ups and cueing required for proper hip positioning.    Comorbidities HTN, HLD    Rehab Potential Good    PT Frequency 1x / week    PT Duration 6 weeks    PT Treatment/Interventions ADLs/Self Care Home Management;Cryotherapy;Electrical  Stimulation;Iontophoresis 77m/ml Dexamethasone;Moist Heat;Traction;Balance training;Therapeutic exercise;Therapeutic activities;Functional mobility training;Stair training;Gait training;Ultrasound;Neuromuscular re-education;Patient/family education;Manual techniques;Taping;Energy conservation;Dry needling;Passive range of motion    PT Next Visit Plan Progress lumbar ROM and hip ABD strength    Consulted and Agree with Plan of Care Patient           Patient will benefit from skilled therapeutic intervention in order to improve the following deficits and impairments:  Hypomobility,Decreased activity tolerance,Decreased strength,Increased fascial restricitons,Pain,Increased muscle spasms,Decreased range of motion,Improper body mechanics,Postural dysfunction,Impaired flexibility  Visit Diagnosis: Pain in right hip  Chronic low back pain without sciatica, unspecified back pain laterality  Difficulty in walking, not elsewhere classified     Problem List Patient Active Problem List   Diagnosis Date Noted  . Vitamin D deficiency 05/15/2018  . Essential hypertension 04/04/2018  . SI (sacroiliac) joint dysfunction 01/04/2018  . Lower extremity edema 01/31/2016  . Class 1 obesity due to excess calories with serious comorbidity and body mass index (BMI) of 31.0 to 31.9 in adult 01/31/2016    MBess Harvest PTA 03/10/20 [facility] [address] [place] [address]Phone: [phone]  Fax:  [phone] Name: EStarlit RaburnMRN: [id number] of Birth: 6March 30, 1962  PHYSICAL THERAPY DISCHARGE SUMMARY  Visits from Start of Care: 6  Current functional level related to goals / functional outcomes: Unable to assess; patient cx'd appointment d/t change in insurance   Remaining deficits: Unable to assess   Education / Equipment: HEP  Plan: Patient agrees to discharge.  Patient goals were partially met.  Patient is being discharged due to financial reasons.  ?????     YJanene Harvey PT, DPT 04/22/20 1:53 PM

## 2020-03-18 ENCOUNTER — Encounter: Payer: BC Managed Care – PPO | Admitting: Physical Therapy

## 2020-03-25 ENCOUNTER — Ambulatory Visit: Payer: BC Managed Care – PPO | Admitting: Physical Therapy

## 2020-04-08 ENCOUNTER — Other Ambulatory Visit: Payer: Self-pay | Admitting: Family Medicine

## 2020-04-08 DIAGNOSIS — E559 Vitamin D deficiency, unspecified: Secondary | ICD-10-CM

## 2020-04-08 NOTE — Telephone Encounter (Signed)
Would you like Pt to continue Ergocalciferol?  

## 2020-06-02 ENCOUNTER — Other Ambulatory Visit: Payer: BC Managed Care – PPO

## 2020-06-11 ENCOUNTER — Other Ambulatory Visit: Payer: Self-pay

## 2020-06-11 ENCOUNTER — Other Ambulatory Visit (INDEPENDENT_AMBULATORY_CARE_PROVIDER_SITE_OTHER): Payer: BC Managed Care – PPO

## 2020-06-11 DIAGNOSIS — E559 Vitamin D deficiency, unspecified: Secondary | ICD-10-CM | POA: Diagnosis not present

## 2020-06-11 DIAGNOSIS — I10 Essential (primary) hypertension: Secondary | ICD-10-CM

## 2020-06-11 DIAGNOSIS — E538 Deficiency of other specified B group vitamins: Secondary | ICD-10-CM | POA: Diagnosis not present

## 2020-06-11 DIAGNOSIS — E785 Hyperlipidemia, unspecified: Secondary | ICD-10-CM

## 2020-06-11 LAB — COMPREHENSIVE METABOLIC PANEL
ALT: 34 U/L (ref 0–35)
AST: 34 U/L (ref 0–37)
Albumin: 4.2 g/dL (ref 3.5–5.2)
Alkaline Phosphatase: 116 U/L (ref 39–117)
BUN: 19 mg/dL (ref 6–23)
CO2: 29 mEq/L (ref 19–32)
Calcium: 9.3 mg/dL (ref 8.4–10.5)
Chloride: 102 mEq/L (ref 96–112)
Creatinine, Ser: 0.89 mg/dL (ref 0.40–1.20)
GFR: 69.8 mL/min (ref >60.00)
Glucose, Bld: 103 mg/dL — ABNORMAL HIGH (ref 70–99)
Potassium: 3.8 mEq/L (ref 3.5–5.1)
Sodium: 141 mEq/L (ref 135–145)
Total Bilirubin: 0.6 mg/dL (ref 0.2–1.2)
Total Protein: 6.7 g/dL (ref 6.0–8.3)

## 2020-06-11 LAB — LIPID PANEL
Cholesterol: 260 mg/dL — ABNORMAL HIGH (ref 0–200)
HDL: 46.9 mg/dL (ref >39.00)
LDL Cholesterol: 181 mg/dL — ABNORMAL HIGH (ref 0–99)
NonHDL: 213.18
Total CHOL/HDL Ratio: 6
Triglycerides: 161 mg/dL — ABNORMAL HIGH (ref 0.0–149.0)
VLDL: 32.2 mg/dL (ref 0.0–40.0)

## 2020-06-11 LAB — VITAMIN D 25 HYDROXY (VIT D DEFICIENCY, FRACTURES): VITD: 28.47 ng/mL — ABNORMAL LOW (ref 30.00–100.00)

## 2020-06-11 LAB — VITAMIN B12: Vitamin B-12: >1506 pg/mL — ABNORMAL HIGH (ref 211–911)

## 2020-06-15 ENCOUNTER — Other Ambulatory Visit: Payer: Self-pay | Admitting: Family Medicine

## 2020-06-15 ENCOUNTER — Encounter: Payer: Self-pay | Admitting: Family Medicine

## 2020-06-15 DIAGNOSIS — E785 Hyperlipidemia, unspecified: Secondary | ICD-10-CM

## 2020-06-16 ENCOUNTER — Other Ambulatory Visit: Payer: Self-pay

## 2020-06-16 DIAGNOSIS — E785 Hyperlipidemia, unspecified: Secondary | ICD-10-CM

## 2020-06-16 DIAGNOSIS — E559 Vitamin D deficiency, unspecified: Secondary | ICD-10-CM

## 2020-06-16 MED ORDER — ROSUVASTATIN CALCIUM 10 MG PO TABS: 10.0000 mg | ORAL_TABLET | Freq: Every day | ORAL | 2 refills | Status: DC

## 2020-06-16 MED ORDER — VITAMIN D (ERGOCALCIFEROL) 1.25 MG (50000 UNIT) PO CAPS: 50000.0000 [IU] | ORAL_CAPSULE | ORAL | 0 refills | Status: DC

## 2020-06-17 ENCOUNTER — Other Ambulatory Visit: Payer: Self-pay | Admitting: Family Medicine

## 2020-06-17 DIAGNOSIS — E559 Vitamin D deficiency, unspecified: Secondary | ICD-10-CM

## 2020-09-15 ENCOUNTER — Other Ambulatory Visit: Payer: BC Managed Care – PPO

## 2020-09-18 ENCOUNTER — Other Ambulatory Visit: Payer: Self-pay | Admitting: Family Medicine

## 2020-09-18 DIAGNOSIS — E785 Hyperlipidemia, unspecified: Secondary | ICD-10-CM

## 2020-09-18 DIAGNOSIS — E559 Vitamin D deficiency, unspecified: Secondary | ICD-10-CM

## 2020-09-18 NOTE — Telephone Encounter (Signed)
Would you like Pt to continue Vit D?

## 2020-09-22 ENCOUNTER — Other Ambulatory Visit: Payer: Self-pay

## 2020-09-22 ENCOUNTER — Other Ambulatory Visit (INDEPENDENT_AMBULATORY_CARE_PROVIDER_SITE_OTHER): Payer: BC Managed Care – PPO

## 2020-09-22 ENCOUNTER — Other Ambulatory Visit: Payer: Self-pay | Admitting: Family Medicine

## 2020-09-22 DIAGNOSIS — E785 Hyperlipidemia, unspecified: Secondary | ICD-10-CM | POA: Diagnosis not present

## 2020-09-22 DIAGNOSIS — E559 Vitamin D deficiency, unspecified: Secondary | ICD-10-CM

## 2020-09-22 LAB — COMPREHENSIVE METABOLIC PANEL
ALT: 33 U/L (ref 0–35)
AST: 35 U/L (ref 0–37)
Albumin: 4.4 g/dL (ref 3.5–5.2)
Alkaline Phosphatase: 111 U/L (ref 39–117)
BUN: 23 mg/dL (ref 6–23)
CO2: 26 mEq/L (ref 19–32)
Calcium: 9.4 mg/dL (ref 8.4–10.5)
Chloride: 101 mEq/L (ref 96–112)
Creatinine, Ser: 0.96 mg/dL (ref 0.40–1.20)
GFR: 63.61 mL/min (ref >60.00)
Glucose, Bld: 102 mg/dL — ABNORMAL HIGH (ref 70–99)
Potassium: 3.8 mEq/L (ref 3.5–5.1)
Sodium: 138 mEq/L (ref 135–145)
Total Bilirubin: 0.9 mg/dL (ref 0.2–1.2)
Total Protein: 6.9 g/dL (ref 6.0–8.3)

## 2020-09-22 LAB — VITAMIN D 25 HYDROXY (VIT D DEFICIENCY, FRACTURES): VITD: 39.47 ng/mL (ref 30.00–100.00)

## 2020-09-22 LAB — LIPID PANEL
Cholesterol: 226 mg/dL — ABNORMAL HIGH (ref 0–200)
HDL: 46.7 mg/dL (ref >39.00)
LDL Cholesterol: 149 mg/dL — ABNORMAL HIGH (ref 0–99)
NonHDL: 179.19
Total CHOL/HDL Ratio: 5
Triglycerides: 152 mg/dL — ABNORMAL HIGH (ref 0.0–149.0)
VLDL: 30.4 mg/dL (ref 0.0–40.0)

## 2020-09-23 MED ORDER — ROSUVASTATIN CALCIUM 20 MG PO TABS: 20.0000 mg | ORAL_TABLET | Freq: Every day | ORAL | 0 refills | Status: DC

## 2020-09-23 NOTE — Addendum Note (Signed)
Addended by: Sanda Linger on: 09/23/2020 11:20 AM   Modules accepted: Orders

## 2020-11-17 DIAGNOSIS — L814 Other melanin hyperpigmentation: Secondary | ICD-10-CM | POA: Diagnosis not present

## 2020-11-17 DIAGNOSIS — L719 Rosacea, unspecified: Secondary | ICD-10-CM | POA: Diagnosis not present

## 2020-11-17 DIAGNOSIS — L821 Other seborrheic keratosis: Secondary | ICD-10-CM | POA: Diagnosis not present

## 2020-11-17 DIAGNOSIS — D1801 Hemangioma of skin and subcutaneous tissue: Secondary | ICD-10-CM | POA: Diagnosis not present

## 2020-12-20 ENCOUNTER — Other Ambulatory Visit: Payer: Self-pay | Admitting: Family Medicine

## 2020-12-20 DIAGNOSIS — E559 Vitamin D deficiency, unspecified: Secondary | ICD-10-CM

## 2020-12-25 ENCOUNTER — Other Ambulatory Visit: Payer: Self-pay | Admitting: Family Medicine

## 2021-01-11 ENCOUNTER — Encounter: Payer: Self-pay | Admitting: Family Medicine

## 2021-01-11 ENCOUNTER — Other Ambulatory Visit: Payer: Self-pay

## 2021-01-11 ENCOUNTER — Ambulatory Visit (INDEPENDENT_AMBULATORY_CARE_PROVIDER_SITE_OTHER): Payer: BC Managed Care – PPO | Admitting: Family Medicine

## 2021-01-11 VITALS — BP 122/62 | HR 60 | Temp 97.8°F | Resp 18 | Ht 64.5 in | Wt 196.5 lb

## 2021-01-11 DIAGNOSIS — R600 Localized edema: Secondary | ICD-10-CM | POA: Diagnosis not present

## 2021-01-11 DIAGNOSIS — E785 Hyperlipidemia, unspecified: Secondary | ICD-10-CM

## 2021-01-11 DIAGNOSIS — Z0000 Encounter for general adult medical examination without abnormal findings: Secondary | ICD-10-CM | POA: Diagnosis not present

## 2021-01-11 DIAGNOSIS — H9192 Unspecified hearing loss, left ear: Secondary | ICD-10-CM

## 2021-01-11 DIAGNOSIS — H9312 Tinnitus, left ear: Secondary | ICD-10-CM

## 2021-01-11 DIAGNOSIS — E559 Vitamin D deficiency, unspecified: Secondary | ICD-10-CM

## 2021-01-11 DIAGNOSIS — I10 Essential (primary) hypertension: Secondary | ICD-10-CM

## 2021-01-11 LAB — LIPID PANEL
Cholesterol: 174 mg/dL (ref 0–200)
HDL: 45 mg/dL (ref >39.00)
LDL Cholesterol: 92 mg/dL (ref 0–99)
NonHDL: 128.57
Total CHOL/HDL Ratio: 4
Triglycerides: 185 mg/dL — ABNORMAL HIGH (ref 0.0–149.0)
VLDL: 37 mg/dL (ref 0.0–40.0)

## 2021-01-11 LAB — COMPREHENSIVE METABOLIC PANEL
ALT: 19 U/L (ref 0–35)
AST: 24 U/L (ref 0–37)
Albumin: 4.3 g/dL (ref 3.5–5.2)
Alkaline Phosphatase: 101 U/L (ref 39–117)
BUN: 17 mg/dL (ref 6–23)
CO2: 30 mEq/L (ref 19–32)
Calcium: 9.3 mg/dL (ref 8.4–10.5)
Chloride: 101 mEq/L (ref 96–112)
Creatinine, Ser: 0.91 mg/dL (ref 0.40–1.20)
GFR: 67.68 mL/min (ref >60.00)
Glucose, Bld: 99 mg/dL (ref 70–99)
Potassium: 4.1 mEq/L (ref 3.5–5.1)
Sodium: 140 mEq/L (ref 135–145)
Total Bilirubin: 0.8 mg/dL (ref 0.2–1.2)
Total Protein: 6.5 g/dL (ref 6.0–8.3)

## 2021-01-11 LAB — CBC WITH DIFFERENTIAL/PLATELET
Basophils Absolute: 0 10*3/uL (ref 0.0–0.1)
Basophils Relative: 0.3 % (ref 0.0–3.0)
Eosinophils Absolute: 0.1 10*3/uL (ref 0.0–0.7)
Eosinophils Relative: 2.5 % (ref 0.0–5.0)
HCT: 41.4 % (ref 36.0–46.0)
Hemoglobin: 14 g/dL (ref 12.0–15.0)
Lymphocytes Relative: 28.6 % (ref 12.0–46.0)
Lymphs Abs: 1.7 10*3/uL (ref 0.7–4.0)
MCHC: 33.8 g/dL (ref 30.0–36.0)
MCV: 90.9 fl (ref 78.0–100.0)
Monocytes Absolute: 0.4 10*3/uL (ref 0.1–1.0)
Monocytes Relative: 6.7 % (ref 3.0–12.0)
Neutro Abs: 3.6 10*3/uL (ref 1.4–7.7)
Neutrophils Relative %: 61.9 % (ref 43.0–77.0)
Platelets: 231 10*3/uL (ref 150.0–400.0)
RBC: 4.55 Mil/uL (ref 3.87–5.11)
RDW: 13.5 % (ref 11.5–15.5)
WBC: 5.8 10*3/uL (ref 4.0–10.5)

## 2021-01-11 LAB — VITAMIN D 25 HYDROXY (VIT D DEFICIENCY, FRACTURES): VITD: 43.17 ng/mL (ref 30.00–100.00)

## 2021-01-11 LAB — TSH: TSH: 2.13 u[IU]/mL (ref 0.35–5.50)

## 2021-01-11 MED ORDER — ROSUVASTATIN CALCIUM 20 MG PO TABS: ORAL_TABLET | ORAL | 1 refills | Status: DC

## 2021-01-11 MED ORDER — VITAMIN D (ERGOCALCIFEROL) 1.25 MG (50000 UNIT) PO CAPS: 50000.0000 [IU] | ORAL_CAPSULE | ORAL | 0 refills | Status: DC

## 2021-01-11 MED ORDER — HYDROCHLOROTHIAZIDE 25 MG PO TABS: 25.0000 mg | ORAL_TABLET | Freq: Every day | ORAL | 3 refills | Status: DC | PRN

## 2021-01-11 NOTE — Patient Instructions (Signed)
Preventive Care 40-62 Years Old, Female Preventive care refers to lifestyle choices and visits with your health care provider that can promote health and wellness. This includes: A yearly physical exam. This is also called an annual wellness visit. Regular dental and eye exams. Immunizations. Screening for certain conditions. Healthy lifestyle choices, such as: Eating a healthy diet. Getting regular exercise. Not using drugs or products that contain nicotine and tobacco. Limiting alcohol use. What can I expect for my preventive care visit? Physical exam Your health care provider will check your: Height and weight. These may be used to calculate your BMI (body mass index). BMI is a measurement that tells if you are at a healthy weight. Heart rate and blood pressure. Body temperature. Skin for abnormal spots. Counseling Your health care provider may ask you questions about your: Past medical problems. Family's medical history. Alcohol, tobacco, and drug use. Emotional well-being. Home life and relationship well-being. Sexual activity. Diet, exercise, and sleep habits. Work and work environment. Access to firearms. Method of birth control. Menstrual cycle. Pregnancy history. What immunizations do I need? Vaccines are usually given at various ages, according to a schedule. Your health care provider will recommend vaccines for you based on your age, medical history, and lifestyle or other factors, such as travel or where you work. What tests do I need? Blood tests Lipid and cholesterol levels. These may be checked every 5 years, or more often if you are over 50 years old. Hepatitis C test. Hepatitis B test. Screening Lung cancer screening. You may have this screening every year starting at age 55 if you have a 30-pack-year history of smoking and currently smoke or have quit within the past 15 years. Colorectal cancer screening. All adults should have this screening starting at  age 50 and continuing until age 75. Your health care provider may recommend screening at age 45 if you are at increased risk. You will have tests every 1-10 years, depending on your results and the type of screening test. Diabetes screening. This is done by checking your blood sugar (glucose) after you have not eaten for a while (fasting). You may have this done every 1-3 years. Mammogram. This may be done every 1-2 years. Talk with your health care provider about when you should start having regular mammograms. This may depend on whether you have a family history of breast cancer. BRCA-related cancer screening. This may be done if you have a family history of breast, ovarian, tubal, or peritoneal cancers. Pelvic exam and Pap test. This may be done every 3 years starting at age 21. Starting at age 30, this may be done every 5 years if you have a Pap test in combination with an HPV test. Other tests STD (sexually transmitted disease) testing, if you are at risk. Bone density scan. This is done to screen for osteoporosis. You may have this scan if you are at high risk for osteoporosis. Talk with your health care provider about your test results, treatment options, and if necessary, the need for more tests. Follow these instructions at home: Eating and drinking  Eat a diet that includes fresh fruits and vegetables, whole grains, lean protein, and low-fat dairy products. Take vitamin and mineral supplements as recommended by your health care provider. Do not drink alcohol if: Your health care provider tells you not to drink. You are pregnant, may be pregnant, or are planning to become pregnant. If you drink alcohol: Limit how much you have to 0-1 drink a day. Be   aware of how much alcohol is in your drink. In the [place] one drink equals one 12 oz bottle of beer (355 mL), one 5 oz glass of wine (148 mL), or one 1 oz glass of hard liquor (44 mL). Lifestyle Take daily care of your teeth and  gums. Brush your teeth every morning and night with fluoride toothpaste. Floss one time each day. Stay active. Exercise for at least 30 minutes 5 or more days each week. Do not use any products that contain nicotine or tobacco, such as cigarettes, e-cigarettes, and chewing tobacco. If you need help quitting, ask your health care provider. Do not use drugs. If you are sexually active, practice safe sex. Use a condom or other form of protection to prevent STIs (sexually transmitted infections). If you do not wish to become pregnant, use a form of birth control. If you plan to become pregnant, see your health care provider for a prepregnancy visit. If told by your health care provider, take low-dose aspirin daily starting at age 63. Find healthy ways to cope with stress, such as: Meditation, yoga, or listening to music. Journaling. Talking to a trusted person. Spending time with friends and family. Safety Always wear your seat belt while driving or riding in a vehicle. Do not drive: If you have been drinking alcohol. Do not ride with someone who has been drinking. When you are tired or distracted. While texting. Wear a helmet and other protective equipment during sports activities. If you have firearms in your house, make sure you follow all gun safety procedures. What's next? Visit your health care provider once a year for an annual wellness visit. Ask your health care provider how often you should have your eyes and teeth checked. Stay up to date on all vaccines. This information is not intended to replace advice given to you by your health care provider. Make sure you discuss any questions you have with your health care provider. Document Revised: 05/08/2020 Document Reviewed: 11/09/2017 Elsevier Patient Education  2022 [employer].

## 2021-01-11 NOTE — Progress Notes (Signed)
Subjective:     Tami Monroe is a 59 y.o. female and is here for a comprehensive physical exam. The patient reports problems - tinnitus and hearing loss mostly in L ear  .  No other complaints She is f/u on bp and cholesterol as well   Social History   Socioeconomic History   Marital status: Single    Spouse name: Not on file   Number of children: Not on file   Years of education: Not on file   Highest education level: Not on file  Occupational History   Occupation: Freight forwarder in customers    Employer: [employer]  Tobacco Use   Smoking status: Never   Smokeless tobacco: Never  Vaping Use   Vaping Use: Never used  Substance and Sexual Activity   Alcohol use: Yes    Comment: OCC   Drug use: No   Sexual activity: Not Currently    Partners: Male  Other Topics Concern   Not on file  Social History Narrative   -Exercise--- walking 2 x a week   Social Determinants of Health   Financial Resource Strain: Not on file  Food Insecurity: Not on file  Transportation Needs: Not on file  Physical Activity: Not on file  Stress: Not on file  Social Connections: Not on file  Intimate Partner Violence: Not on file   Health Maintenance  Topic Date Due   COVID-19 Vaccine (4 - Booster for Moderna series) 01/14/2021   HIV Screening  01/05/2024 (Originally 08/28/1975)   MAMMOGRAM  01/27/2021   COLONOSCOPY (Pts 45-88yrs Insurance coverage will need to be confirmed)  02/14/2022   PAP SMEAR-Modifier  01/09/2023   TETANUS/TDAP  10/08/2025   INFLUENZA VACCINE  Completed   Hepatitis C Screening  Completed   Zoster Vaccines- Shingrix  Completed   HPV VACCINES  Aged Out   Pneumococcal Vaccine 60-62 Years old  Discontinued    The following portions of the patient's history were reviewed and updated as appropriate: She  has a past medical history of Allergy, Arthritis, Constipation, Hyperlipidemia, Hypertension, and Joint pain. She does not have any pertinent problems on file. She  has a  past surgical history that includes Cholecystectomy (2011). Her family history includes Alcohol abuse in her father; Arthritis in her mother; Bladder Cancer (age of onset: 82) in her mother; Cancer in an other family member; Crohn's disease (age of onset: 16) in her brother; Depression in her mother; Diabetes in her mother; Diabetes (age of onset: 57) in her sister; Diabetes Mellitus II (age of onset: 65) in her brother; Heart disease in her brother and mother; Hyperlipidemia in her mother; Hypertension in her mother; Lung cancer in her father; Obesity in her father and mother; Stroke in her sister; Vision loss in her sister. She  reports that she has never smoked. She has never used smokeless tobacco. She reports current alcohol use. She reports that she does not use drugs. She has a current medication list which includes the following prescription(s): ibuprofen, hydrochlorothiazide, rosuvastatin, and vitamin d (ergocalciferol), and the following Facility-Administered Medications: sodium chloride. Current Outpatient Medications on File Prior to Visit  Medication Sig Dispense Refill   ibuprofen (ADVIL,MOTRIN) 200 MG tablet Take 200 mg by mouth as needed.     Current Facility-Administered Medications on File Prior to Visit  Medication Dose Route Frequency Provider Last Rate Last Admin   0.9 %  sodium chloride infusion  500 mL Intravenous Continuous Nandigam, Venia Minks, MD  She has No Known Allergies..  Review of Systems Review of Systems  Constitutional: Negative for activity change, appetite change and fatigue.  HENT: Negative for hearing loss, congestion, tinnitus and ear discharge.  dentist q31m Eyes: Negative for visual disturbance (see optho q1y -- vision corrected to 20/20 with glasses).  Respiratory: Negative for cough, chest tightness and shortness of breath.   Cardiovascular: Negative for chest pain, palpitations and leg swelling.  Gastrointestinal: Negative for abdominal pain,  diarrhea, constipation and abdominal distention.  Genitourinary: Negative for urgency, frequency, decreased urine volume and difficulty urinating.  Musculoskeletal: Negative for back pain, arthralgias and gait problem.  Skin: Negative for color change, pallor and rash.  Neurological: Negative for dizziness, light-headedness, numbness and headaches.  Hematological: Negative for adenopathy. Does not bruise/bleed easily.  Psychiatric/Behavioral: Negative for suicidal ideas, confusion, sleep disturbance, self-injury, dysphoric mood, decreased concentration and agitation.     Objective:    BP 122/62 (BP Location: Left Arm, Patient Position: Sitting, Cuff Size: Large)   Pulse 60   Temp 97.8 F (36.6 C) (Oral)   Resp 18   Ht 5' 4.5" (1.638 m)   Wt 196 lb 8 oz (89.1 kg)   SpO2 98%   BMI 33.21 kg/m  General appearance: alert, cooperative, appears stated age, and no distress Head: Normocephalic, without obvious abnormality, atraumatic Eyes: negative findings: lids and lashes normal, conjunctivae and sclerae normal, and pupils equal, round, reactive to light and accomodation Ears: normal TM's and external ear canals both ears Neck: no adenopathy, no carotid bruit, no JVD, supple, symmetrical, trachea midline, and thyroid not enlarged, symmetric, no tenderness/mass/nodules Back: symmetric, no curvature. ROM normal. No CVA tenderness. Lungs: clear to auscultation bilaterally Heart: regular rate and rhythm, S1, S2 normal, no murmur, click, rub or gallop Abdomen: soft, non-tender; bowel sounds normal; no masses,  no organomegaly Extremities: extremities normal, atraumatic, no cyanosis or edema Pulses: 2+ and symmetric Skin: Skin color, texture, turgor normal. No rashes or lesions Lymph nodes: Cervical, supraclavicular, and axillary nodes normal. Neurologic: Alert and oriented X 3, normal strength and tone. Normal symmetric reflexes. Normal coordination and gait    Assessment:    Healthy  female exam.      Plan:    Ghm utd Check labs  See After Visit Summary for Counseling Recommendations \  1. Bilateral edema of lower extremity Stable  Con't hctz  - hydrochlorothiazide (HYDRODIURIL) 25 MG tablet; Take 1 tablet (25 mg total) by mouth daily as needed.  Dispense: 90 tablet; Refill: 3  2. Hyperlipidemia, unspecified hyperlipidemia type Encourage heart healthy diet such as MIND or DASH diet, increase exercise, avoid trans fats, simple carbohydrates and processed foods, consider a krill or fish or flaxseed oil cap daily.   - rosuvastatin (CRESTOR) 20 MG tablet; TAKE 1 TABLET(20 MG) BY MOUTH DAILY  Dispense: 90 tablet; Refill: 1  3. Vitamin D deficiency Check labs today - Vitamin D, Ergocalciferol, (DRISDOL) 1.25 MG (50000 UNIT) CAPS capsule; Take 1 capsule (50,000 Units total) by mouth every 7 (seven) days.  Dispense: 12 capsule; Refill: 0 - VITAMIN D 25 Hydroxy (Vit-D Deficiency, Fractures)  4. Preventative health care See above - CBC with Differential/Platelet - Comprehensive metabolic panel - Lipid panel - TSH  5. Hearing loss of left ear, unspecified hearing loss type Refer to audiology - Ambulatory referral to Audiology  6. Tinnitus of left ear   - Ambulatory referral to Audiology  7. Essential hypertension Well controlled, no changes to meds. Encouraged heart healthy diet such as  the DASH diet and exercise as tolerated.

## 2021-01-11 NOTE — Assessment & Plan Note (Signed)
Well controlled, no changes to meds. Encouraged heart healthy diet such as the DASH diet and exercise as tolerated.  °

## 2021-02-09 DIAGNOSIS — H903 Sensorineural hearing loss, bilateral: Secondary | ICD-10-CM | POA: Diagnosis not present

## 2021-05-02 ENCOUNTER — Other Ambulatory Visit: Payer: Self-pay | Admitting: Family Medicine

## 2021-05-02 DIAGNOSIS — E559 Vitamin D deficiency, unspecified: Secondary | ICD-10-CM

## 2021-07-12 ENCOUNTER — Ambulatory Visit (INDEPENDENT_AMBULATORY_CARE_PROVIDER_SITE_OTHER): Payer: 59 | Admitting: Family Medicine

## 2021-07-12 ENCOUNTER — Encounter: Payer: Self-pay | Admitting: Family Medicine

## 2021-07-12 VITALS — BP 112/78 | HR 61 | Temp 98.7°F | Resp 18 | Ht 64.5 in | Wt 200.2 lb

## 2021-07-12 DIAGNOSIS — I10 Essential (primary) hypertension: Secondary | ICD-10-CM | POA: Diagnosis not present

## 2021-07-12 DIAGNOSIS — E785 Hyperlipidemia, unspecified: Secondary | ICD-10-CM | POA: Diagnosis not present

## 2021-07-12 LAB — LIPID PANEL
Cholesterol: 149 mg/dL (ref 0–200)
HDL: 41.8 mg/dL (ref >39.00)
LDL Cholesterol: 78 mg/dL (ref 0–99)
NonHDL: 106.99
Total CHOL/HDL Ratio: 4
Triglycerides: 143 mg/dL (ref 0.0–149.0)
VLDL: 28.6 mg/dL (ref 0.0–40.0)

## 2021-07-12 LAB — COMPREHENSIVE METABOLIC PANEL
ALT: 19 U/L (ref 0–35)
AST: 24 U/L (ref 0–37)
Albumin: 4.4 g/dL (ref 3.5–5.2)
Alkaline Phosphatase: 117 U/L (ref 39–117)
BUN: 20 mg/dL (ref 6–23)
CO2: 28 mEq/L (ref 19–32)
Calcium: 9.5 mg/dL (ref 8.4–10.5)
Chloride: 101 mEq/L (ref 96–112)
Creatinine, Ser: 0.88 mg/dL (ref 0.40–1.20)
GFR: 70.21 mL/min (ref >60.00)
Glucose, Bld: 99 mg/dL (ref 70–99)
Potassium: 3.7 mEq/L (ref 3.5–5.1)
Sodium: 139 mEq/L (ref 135–145)
Total Bilirubin: 0.6 mg/dL (ref 0.2–1.2)
Total Protein: 7.1 g/dL (ref 6.0–8.3)

## 2021-07-12 MED ORDER — ROSUVASTATIN CALCIUM 20 MG PO TABS: ORAL_TABLET | ORAL | 1 refills | Status: DC

## 2021-07-12 NOTE — Assessment & Plan Note (Signed)
Well controlled, no changes to meds. Encouraged heart healthy diet such as the DASH diet and exercise as tolerated.  °

## 2021-07-12 NOTE — Assessment & Plan Note (Signed)
Encourage heart healthy diet such as MIND or DASH diet, increase exercise, avoid trans fats, simple carbohydrates and processed foods, consider a krill or fish or flaxseed oil cap daily.  °

## 2021-07-12 NOTE — Progress Notes (Signed)
? ?Subjective:  ? ?By signing my name below, I, Carylon Perches, attest that this documentation has been prepared under the direction and in the presence of Roma Schanz DO, 07/12/2021   ? ? Patient ID: Tami Monroe, female    DOB: [birth year], 61 y.o.   MRN: [id number] ? ?Chief Complaint  ?Patient presents with  ? Hypertension  ? Hyperlipidemia  ? Follow-up  ? ? ?HPI ?Patient is in today for an office visit ? ?She is requesting a refill of 20 MG of Crestor.  ? ?She complains of possible environmental allergies. She states that she had a headache last week that caused her trouble with keeping her eyes open. Claritin did not help. Flonase pills helped with her symptoms but made her tired. She took a COVID test and results came back negative.  ? ?As of today's visit, her blood pressure is good.  ?BP Readings from Last 3 Encounters:  ?07/12/21 112/78  ?01/11/21 122/62  ?01/21/20 128/86  ? ? ? ?Past Medical History:  ?Diagnosis Date  ? Allergy   ? Arthritis   ? HIP,KNEE  ? Constipation   ? Hyperlipidemia   ? Hypertension   ? Joint pain   ? ? ?Past Surgical History:  ?Procedure Laterality Date  ? CHOLECYSTECTOMY  2011  ? ? ?Family History  ?Problem Relation Age of Onset  ? Arthritis Mother   ? Hyperlipidemia Mother   ? Heart disease Mother   ? Hypertension Mother   ? Diabetes Mother   ? Bladder Cancer Mother 23  ? Depression Mother   ? Obesity Mother   ? Lung cancer Father   ?     smoker  ? Alcohol abuse Father   ? Obesity Father   ? Cancer Other   ?     Ureter cancer  ? Diabetes Sister 64  ? Vision loss Sister   ?     optic nerve stroke  ? Stroke Sister   ? Diabetes Mellitus II Brother 34  ? Heart disease Brother   ? Crohn's disease Brother 37  ? Colon cancer Neg Hx   ? Colon polyps Neg Hx   ? Esophageal cancer Neg Hx   ? Rectal cancer Neg Hx   ? Stomach cancer Neg Hx   ? ? ?Social History  ? ?Socioeconomic History  ? Marital status: Single  ?  Spouse name: Not on file  ? Number of children: Not on file  ? Years of  education: Not on file  ? Highest education level: Not on file  ?Occupational History  ? Occupation: Freight forwarder in customers  ?  Employer: [employer]  ?Tobacco Use  ? Smoking status: Never  ? Smokeless tobacco: Never  ?Vaping Use  ? Vaping Use: Never used  ?Substance and Sexual Activity  ? Alcohol use: Yes  ?  Comment: OCC  ? Drug use: No  ? Sexual activity: Not Currently  ?  Partners: Male  ?Other Topics Concern  ? Not on file  ?Social History Narrative  ? -Exercise--- walking 2 x a week  ? ?Social Determinants of Health  ? ?Financial Resource Strain: Not on file  ?Food Insecurity: Not on file  ?Transportation Needs: Not on file  ?Physical Activity: Not on file  ?Stress: Not on file  ?Social Connections: Not on file  ?Intimate Partner Violence: Not on file  ? ? ?Outpatient Medications Prior to Visit  ?Medication Sig Dispense Refill  ? hydrochlorothiazide (HYDRODIURIL) 25 MG tablet Take 1 tablet (  25 mg total) by mouth daily as needed. 90 tablet 3  ? ibuprofen (ADVIL,MOTRIN) 200 MG tablet Take 200 mg by mouth as needed.    ? Vitamin D, Ergocalciferol, (DRISDOL) 1.25 MG (50000 UNIT) CAPS capsule TAKE 1 CAPSULE BY MOUTH EVERY 7 DAYS 12 capsule 0  ? rosuvastatin (CRESTOR) 20 MG tablet TAKE 1 TABLET(20 MG) BY MOUTH DAILY 90 tablet 1  ? ?Facility-Administered Medications Prior to Visit  ?Medication Dose Route Frequency Provider Last Rate Last Admin  ? 0.9 %  sodium chloride infusion  500 mL Intravenous Continuous Nandigam, Venia Minks, MD      ? ? ?No Known Allergies ? ?Review of Systems  ?Endo/Heme/Allergies:  Positive for environmental allergies.  ? ?   ?Objective:  ?  ?Physical Exam ?Constitutional:   ?   General: She is not in acute distress. ?   Appearance: Normal appearance. She is not ill-appearing.  ?HENT:  ?   Head: Normocephalic and atraumatic.  ?   Right Ear: External ear normal.  ?   Left Ear: External ear normal.  ?Eyes:  ?   Extraocular Movements: Extraocular movements intact.  ?   Pupils: Pupils are  equal, round, and reactive to light.  ?Cardiovascular:  ?   Rate and Rhythm: Normal rate and regular rhythm.  ?   Heart sounds: Normal heart sounds. No murmur heard. ?  No gallop.  ?Pulmonary:  ?   Effort: Pulmonary effort is normal. No respiratory distress.  ?   Breath sounds: Normal breath sounds. No wheezing or rales.  ?Skin: ?   General: Skin is warm and dry.  ?Neurological:  ?   Mental Status: She is alert and oriented to person, place, and time.  ?Psychiatric:     ?   Judgment: Judgment normal.  ? ? ?BP 112/78 (BP Location: Left Arm, Patient Position: Sitting, Cuff Size: Large)   Pulse 61   Temp 98.7 ?F (37.1 ?C) (Oral)   Resp 18   Ht 5' 4.5" (1.638 m)   Wt 200 lb 3.2 oz (90.8 kg)   SpO2 98%   BMI 33.83 kg/m?  ?Wt Readings from Last 3 Encounters:  ?07/12/21 200 lb 3.2 oz (90.8 kg)  ?01/11/21 196 lb 8 oz (89.1 kg)  ?01/21/20 200 lb (90.7 kg)  ? ? ?Diabetic Foot Exam - Simple   ?No data filed ?  ? ?Lab Results  ?Component Value Date  ? WBC 5.8 01/11/2021  ? HGB 14.0 01/11/2021  ? HCT 41.4 01/11/2021  ? PLT 231.0 01/11/2021  ? GLUCOSE 99 01/11/2021  ? CHOL 174 01/11/2021  ? TRIG 185.0 (H) 01/11/2021  ? HDL 45.00 01/11/2021  ? LDLDIRECT 163.0 01/07/2019  ? [place] 92 01/11/2021  ? ALT 19 01/11/2021  ? AST 24 01/11/2021  ? NA 140 01/11/2021  ? K 4.1 01/11/2021  ? CL 101 01/11/2021  ? CREATININE 0.91 01/11/2021  ? BUN 17 01/11/2021  ? CO2 30 01/11/2021  ? TSH 2.13 01/11/2021  ? HGBA1C 5.5 04/02/2018  ? MICROALBUR 0.2 12/15/2011  ? ? ?Lab Results  ?Component Value Date  ? TSH 2.13 01/11/2021  ? ?Lab Results  ?Component Value Date  ? WBC 5.8 01/11/2021  ? HGB 14.0 01/11/2021  ? HCT 41.4 01/11/2021  ? MCV 90.9 01/11/2021  ? PLT 231.0 01/11/2021  ? ?Lab Results  ?Component Value Date  ? NA 140 01/11/2021  ? K 4.1 01/11/2021  ? CO2 30 01/11/2021  ? GLUCOSE 99 01/11/2021  ? BUN 17 01/11/2021  ?  CREATININE 0.91 01/11/2021  ? BILITOT 0.8 01/11/2021  ? ALKPHOS 101 01/11/2021  ? AST 24 01/11/2021  ? ALT 19 01/11/2021   ? PROT 6.5 01/11/2021  ? ALBUMIN 4.3 01/11/2021  ? CALCIUM 9.3 01/11/2021  ? GFR 67.68 01/11/2021  ? ?Lab Results  ?Component Value Date  ? CHOL 174 01/11/2021  ? ?Lab Results  ?Component Value Date  ? HDL 45.00 01/11/2021  ? ?Lab Results  ?Component Value Date  ? [place] 92 01/11/2021  ? ?Lab Results  ?Component Value Date  ? TRIG 185.0 (H) 01/11/2021  ? ?Lab Results  ?Component Value Date  ? CHOLHDL 4 01/11/2021  ? ?Lab Results  ?Component Value Date  ? HGBA1C 5.5 04/02/2018  ? ? ?   ?Assessment & Plan:  ? ?Problem List Items Addressed This Visit   ? ?  ? Unprioritized  ? Hyperlipidemia  ?  Encourage heart healthy diet such as MIND or DASH diet, increase exercise, avoid trans fats, simple carbohydrates and processed foods, consider a krill or fish or flaxseed oil cap daily.  ? ?  ?  ? Relevant Medications  ? rosuvastatin (CRESTOR) 20 MG tablet  ? Other Relevant Orders  ? Comprehensive metabolic panel  ? Lipid panel  ? Essential hypertension  ?  Well controlled, no changes to meds. Encouraged heart healthy diet such as the DASH diet and exercise as tolerated.  ? ?  ?  ? Relevant Medications  ? rosuvastatin (CRESTOR) 20 MG tablet  ? ?Other Visit Diagnoses   ? ? Primary hypertension    -  Primary  ? Relevant Medications  ? rosuvastatin (CRESTOR) 20 MG tablet  ? Other Relevant Orders  ? Comprehensive metabolic panel  ? Lipid panel  ? ?  ? ? ? ? ?Meds ordered this encounter  ?Medications  ? rosuvastatin (CRESTOR) 20 MG tablet  ?  Sig: TAKE 1 TABLET(20 MG) BY MOUTH DAILY  ?  Dispense:  90 tablet  ?  Refill:  1  ? ? ?I, Ann Held, DO, personally preformed the services described in this documentation.  All medical record entries made by the scribe were at my direction and in my presence.  I have reviewed the chart and discharge instructions (if applicable) and agree that the record reflects my personal performance and is accurate and complete. 07/12/2021 ? ? ?I,Amber Collins,acting as a Education administrator for [employer], DO.,have documented all relevant documentation on the behalf of Ann Held, DO,as directed by  Ann Held, DO while in the presence of Ann Held, DO. ? ? ? ?Rosalita Chessman

## 2021-07-12 NOTE — Patient Instructions (Signed)

## 2021-09-05 ENCOUNTER — Other Ambulatory Visit: Payer: Self-pay | Admitting: Family Medicine

## 2021-09-05 DIAGNOSIS — E559 Vitamin D deficiency, unspecified: Secondary | ICD-10-CM

## 2021-10-04 ENCOUNTER — Telehealth: Payer: Self-pay | Admitting: Family Medicine

## 2021-10-04 NOTE — Telephone Encounter (Signed)
Pt called and advised via VM that refills were sent to the pharmacy  on 07/12/2021

## 2021-10-04 NOTE — Telephone Encounter (Signed)
Medication: rosuvastatin (CRESTOR) 20 MG tablet   Has the patient contacted their pharmacy? Yes.     Preferred Pharmacy:  [id number] - [place], [place] - [address]   [address] [place], [place] [address]  Phone:  [phone]  Fax:  [phone]

## 2021-12-31 ENCOUNTER — Other Ambulatory Visit: Payer: Self-pay | Admitting: Family Medicine

## 2021-12-31 DIAGNOSIS — R600 Localized edema: Secondary | ICD-10-CM

## 2022-01-14 ENCOUNTER — Encounter: Payer: BC Managed Care – PPO | Admitting: Family Medicine

## 2022-01-17 ENCOUNTER — Other Ambulatory Visit (HOSPITAL_BASED_OUTPATIENT_CLINIC_OR_DEPARTMENT_OTHER): Payer: Self-pay | Admitting: Family Medicine

## 2022-01-17 ENCOUNTER — Encounter: Payer: Self-pay | Admitting: Family Medicine

## 2022-01-17 ENCOUNTER — Ambulatory Visit (INDEPENDENT_AMBULATORY_CARE_PROVIDER_SITE_OTHER): Payer: Commercial Managed Care - HMO | Admitting: Family Medicine

## 2022-01-17 VITALS — BP 110/70 | HR 66 | Temp 98.3°F | Resp 18 | Ht 64.8 in | Wt 201.8 lb

## 2022-01-17 DIAGNOSIS — Z86010 Personal history of colonic polyps: Secondary | ICD-10-CM

## 2022-01-17 DIAGNOSIS — E2839 Other primary ovarian failure: Secondary | ICD-10-CM

## 2022-01-17 DIAGNOSIS — Z0000 Encounter for general adult medical examination without abnormal findings: Secondary | ICD-10-CM

## 2022-01-17 DIAGNOSIS — E785 Hyperlipidemia, unspecified: Secondary | ICD-10-CM | POA: Diagnosis not present

## 2022-01-17 DIAGNOSIS — I10 Essential (primary) hypertension: Secondary | ICD-10-CM

## 2022-01-17 DIAGNOSIS — E559 Vitamin D deficiency, unspecified: Secondary | ICD-10-CM | POA: Diagnosis not present

## 2022-01-17 DIAGNOSIS — Z1231 Encounter for screening mammogram for malignant neoplasm of breast: Secondary | ICD-10-CM

## 2022-01-17 HISTORY — DX: Encounter for general adult medical examination without abnormal findings: Z00.00

## 2022-01-17 LAB — COMPREHENSIVE METABOLIC PANEL
ALT: 25 U/L (ref 0–35)
AST: 30 U/L (ref 0–37)
Albumin: 4.5 g/dL (ref 3.5–5.2)
Alkaline Phosphatase: 106 U/L (ref 39–117)
BUN: 18 mg/dL (ref 6–23)
CO2: 30 mEq/L (ref 19–32)
Calcium: 9.7 mg/dL (ref 8.4–10.5)
Chloride: 98 mEq/L (ref 96–112)
Creatinine, Ser: 0.88 mg/dL (ref 0.40–1.20)
GFR: 69.96 mL/min (ref >60.00)
Glucose, Bld: 81 mg/dL (ref 70–99)
Potassium: 3.6 mEq/L (ref 3.5–5.1)
Sodium: 138 mEq/L (ref 135–145)
Total Bilirubin: 0.7 mg/dL (ref 0.2–1.2)
Total Protein: 6.9 g/dL (ref 6.0–8.3)

## 2022-01-17 LAB — CBC WITH DIFFERENTIAL/PLATELET
Basophils Absolute: 0 10*3/uL (ref 0.0–0.1)
Basophils Relative: 0.5 % (ref 0.0–3.0)
Eosinophils Absolute: 0.2 10*3/uL (ref 0.0–0.7)
Eosinophils Relative: 2.5 % (ref 0.0–5.0)
HCT: 41.8 % (ref 36.0–46.0)
Hemoglobin: 14.2 g/dL (ref 12.0–15.0)
Lymphocytes Relative: 30 % (ref 12.0–46.0)
Lymphs Abs: 2 10*3/uL (ref 0.7–4.0)
MCHC: 34 g/dL (ref 30.0–36.0)
MCV: 90.8 fl (ref 78.0–100.0)
Monocytes Absolute: 0.5 10*3/uL (ref 0.1–1.0)
Monocytes Relative: 7.7 % (ref 3.0–12.0)
Neutro Abs: 4 10*3/uL (ref 1.4–7.7)
Neutrophils Relative %: 59.3 % (ref 43.0–77.0)
Platelets: 239 10*3/uL (ref 150.0–400.0)
RBC: 4.6 Mil/uL (ref 3.87–5.11)
RDW: 13.4 % (ref 11.5–15.5)
WBC: 6.8 10*3/uL (ref 4.0–10.5)

## 2022-01-17 LAB — VITAMIN D 25 HYDROXY (VIT D DEFICIENCY, FRACTURES): VITD: 34.49 ng/mL (ref 30.00–100.00)

## 2022-01-17 LAB — LIPID PANEL
Cholesterol: 164 mg/dL (ref 0–200)
HDL: 42.5 mg/dL (ref >39.00)
NonHDL: 121.81
Total CHOL/HDL Ratio: 4
Triglycerides: 263 mg/dL — ABNORMAL HIGH (ref 0.0–149.0)
VLDL: 52.6 mg/dL — ABNORMAL HIGH (ref 0.0–40.0)

## 2022-01-17 LAB — LDL CHOLESTEROL, DIRECT: Direct LDL: 97 mg/dL

## 2022-01-17 LAB — TSH: TSH: 1.55 u[IU]/mL (ref 0.35–5.50)

## 2022-01-17 MED ORDER — ROSUVASTATIN CALCIUM 20 MG PO TABS: ORAL_TABLET | ORAL | 1 refills | Status: DC

## 2022-01-17 NOTE — Progress Notes (Signed)
Subjective:   By signing my name below, I, Carylon Perches, attest that this documentation has been prepared under the direction and in the presence of Ann Held DO 01/17/2022   Patient ID: Tami Monroe, female    DOB: 27-Feb-1961, 61 y.o.   MRN: [id number]  Chief Complaint  Patient presents with   Annual Exam    Pt states not fasting     HPI Patient is in today for a comprehensive physical exam  She is requesting a refill of 20 mg of Crestor  She reports that her swelling mostly occurs while wearing specific shoes.   She denies having any fever, new muscle pain, joint pain , new moles, congestion, sinus pain, sore throat, chest pain, palpations, cough, SOB ,wheezing,n/v/d constipation, blood in stool, dysuria, frequency, hematuria, at this time  She denies of any changes to her family medical history. She reports of no recent surgeries.  Colonoscopy was last completed on 02/15/2019. She states that she is on the 3 year plan for her colonoscopy. She reports that she has not received a letter yet.  Dexa was last completed on 01/09/2020 Pap Smear was last completed on 01/09/2020 Mammogram was last completed on 01/28/2020. She is UTD on her influenza and Covid vaccine. She received both vaccines on 12/04/2021 She reports that she walks a few times a week though she is not consistent.  She is UTD on dental exams. She is not UTD on vision exams   Past Medical History:  Diagnosis Date   Allergy    Arthritis    HIP,KNEE   Constipation    Hyperlipidemia    Hypertension    Joint pain     Past Surgical History:  Procedure Laterality Date   CHOLECYSTECTOMY  2011    Family History  Problem Relation Age of Onset   Arthritis Mother    Hyperlipidemia Mother    Heart disease Mother    Hypertension Mother    Diabetes Mother    Bladder Cancer Mother 84   Depression Mother    Obesity Mother    Lung cancer Father        smoker   Alcohol abuse Father    Obesity Father     Cancer Other        Ureter cancer   Diabetes Sister 85   Vision loss Sister        optic nerve stroke   Stroke Sister    Diabetes Mellitus II Brother 43   Heart disease Brother    Crohn's disease Brother 63   Colon cancer Neg Hx    Colon polyps Neg Hx    Esophageal cancer Neg Hx    Rectal cancer Neg Hx    Stomach cancer Neg Hx     Social History   Socioeconomic History   Marital status: Single    Spouse name: Not on file   Number of children: Not on file   Years of education: Not on file   Highest education level: Not on file  Occupational History   Occupation: Freight forwarder in customers    Employer: [employer]  Tobacco Use   Smoking status: Never   Smokeless tobacco: Never  Vaping Use   Vaping Use: Never used  Substance and Sexual Activity   Alcohol use: Yes    Comment: O[facility]   Drug use: No   Sexual activity: Not Currently    Partners: Male  Other Topics Concern   Not on file  Social History Narrative   -Exercise--- walking 2 x a week   Social Determinants of Radio broadcast assistant Strain: Not on file  Food Insecurity: Not on file  Transportation Needs: Not on file  Physical Activity: Not on file  Stress: Not on file  Social Connections: Not on file  Intimate Partner Violence: Not on file    Outpatient Medications Prior to Visit  Medication Sig Dispense Refill   hydrochlorothiazide (HYDRODIURIL) 25 MG tablet TAKE 1 TABLET(25 MG) BY MOUTH DAILY AS NEEDED 90 tablet 3   ibuprofen (ADVIL,MOTRIN) 200 MG tablet Take 200 mg by mouth as needed.     Vitamin D, Ergocalciferol, (DRISDOL) 1.25 MG (50000 UNIT) CAPS capsule TAKE 1 CAPSULE BY MOUTH EVERY 7 DAYS 12 capsule 2   rosuvastatin (CRESTOR) 20 MG tablet TAKE 1 TABLET(20 MG) BY MOUTH DAILY 90 tablet 1   Facility-Administered Medications Prior to Visit  Medication Dose Route Frequency Provider Last Rate Last Admin   0.9 %  sodium chloride infusion  500 mL Intravenous Continuous Nandigam, Kavitha V, MD         No Known Allergies  Review of Systems  Constitutional:  Negative for fever and malaise/fatigue.  HENT:  Negative for congestion, sinus pain and sore throat.   Eyes:  Negative for blurred vision.  Respiratory:  Negative for cough, shortness of breath and wheezing.   Cardiovascular:  Negative for chest pain, palpitations and leg swelling.  Gastrointestinal:  Negative for abdominal pain, blood in stool, constipation, diarrhea, nausea and vomiting.  Genitourinary:  Negative for dysuria, frequency and hematuria.  Musculoskeletal:  Negative for falls, joint pain and myalgias.  Skin:  Negative for rash.       (-) [place]  Neurological:  Negative for dizziness, loss of consciousness and headaches.  Endo/Heme/Allergies:  Negative for environmental allergies.  Psychiatric/Behavioral:  Negative for depression. The patient is not nervous/anxious.        Objective:    Physical Exam Vitals and nursing note reviewed.  Constitutional:      General: She is not in acute distress.    Appearance: Normal appearance. She is not ill-appearing.  HENT:     Head: Normocephalic and atraumatic.     Right Ear: Tympanic membrane, ear canal and external ear normal.     Left Ear: Tympanic membrane, ear canal and external ear normal.  Eyes:     Extraocular Movements: Extraocular movements intact.     Pupils: Pupils are equal, round, and reactive to light.  Cardiovascular:     Rate and Rhythm: Normal rate and regular rhythm.     Heart sounds: Normal heart sounds. No murmur heard.    No gallop.  Pulmonary:     Effort: Pulmonary effort is normal. No respiratory distress.     Breath sounds: Normal breath sounds. No wheezing or rales.  Abdominal:     General: Bowel sounds are normal. There is no distension.     Palpations: Abdomen is soft.     Tenderness: There is no abdominal tenderness. There is no guarding.  Musculoskeletal:     Cervical back: Normal range of motion and neck supple.  Skin:     General: Skin is warm and dry.  Neurological:     Mental Status: She is alert and oriented to person, place, and time.  Psychiatric:        Judgment: Judgment normal.     BP 110/70 (BP Location: Left Arm, Patient Position: Sitting, Cuff Size: Large)  Pulse 66   Temp 98.3 F (36.8 C) (Oral)   Resp 18   Ht 5' 4.8" (1.646 m)   Wt 201 lb 12.8 oz (91.5 kg)   SpO2 96%   BMI 33.79 kg/m  Wt Readings from Last 3 Encounters:  01/17/22 201 lb 12.8 oz (91.5 kg)  07/12/21 200 lb 3.2 oz (90.8 kg)  01/11/21 196 lb 8 oz (89.1 kg)    Diabetic Foot Exam - Simple   No data filed    Lab Results  Component Value Date   WBC 6.8 01/17/2022   HGB 14.2 01/17/2022   HCT 41.8 01/17/2022   PLT 239.0 01/17/2022   GLUCOSE 81 01/17/2022   CHOL 164 01/17/2022   TRIG 263.0 (H) 01/17/2022   HDL 42.50 01/17/2022   LDLDIRECT 97.0 01/17/2022   LDLCALC 78 07/12/2021   ALT 25 01/17/2022   AST 30 01/17/2022   NA 138 01/17/2022   K 3.6 01/17/2022   CL 98 01/17/2022   CREATININE 0.88 01/17/2022   BUN 18 01/17/2022   CO2 30 01/17/2022   TSH 1.55 01/17/2022   HGBA1C 5.5 04/02/2018   MICROALBUR 0.2 12/15/2011    Lab Results  Component Value Date   TSH 1.55 01/17/2022   Lab Results  Component Value Date   WBC 6.8 01/17/2022   HGB 14.2 01/17/2022   HCT 41.8 01/17/2022   MCV 90.8 01/17/2022   PLT 239.0 01/17/2022   Lab Results  Component Value Date   NA 138 01/17/2022   K 3.6 01/17/2022   CO2 30 01/17/2022   GLUCOSE 81 01/17/2022   BUN 18 01/17/2022   CREATININE 0.88 01/17/2022   BILITOT 0.7 01/17/2022   ALKPHOS 106 01/17/2022   AST 30 01/17/2022   ALT 25 01/17/2022   PROT 6.9 01/17/2022   ALBUMIN 4.5 01/17/2022   CALCIUM 9.7 01/17/2022   GFR 69.96 01/17/2022   Lab Results  Component Value Date   CHOL 164 01/17/2022   Lab Results  Component Value Date   HDL 42.50 01/17/2022   Lab Results  Component Value Date   LDLCALC 78 07/12/2021   Lab Results  Component Value Date    TRIG 263.0 (H) 01/17/2022   Lab Results  Component Value Date   CHOLHDL 4 01/17/2022   Lab Results  Component Value Date   HGBA1C 5.5 04/02/2018       Assessment & Plan:   Problem List Items Addressed This Visit       Unprioritized   Vitamin D deficiency   Relevant Orders   VITAMIN D 25 Hydroxy (Vit-D Deficiency, Fractures) (Completed)   Preventative health care - Primary    Ghm utd Check labs  See AVS       Relevant Orders   CBC with Differential/Platelet (Completed)   Comprehensive metabolic panel (Completed)   Lipid panel (Completed)   VITAMIN D 25 Hydroxy (Vit-D Deficiency, Fractures) (Completed)   TSH (Completed)   Hyperlipidemia    Encourage heart healthy diet such as MIND or DASH diet, increase exercise, avoid trans fats, simple carbohydrates and processed foods, consider a krill or fish or flaxseed oil cap daily.        Relevant Medications   rosuvastatin (CRESTOR) 20 MG tablet   Other Relevant Orders   CBC with Differential/Platelet (Completed)   VITAMIN D 25 Hydroxy (Vit-D Deficiency, Fractures) (Completed)   TSH (Completed)   Essential hypertension    Well controlled, no changes to meds. Encouraged heart healthy diet such as the DASH  diet and exercise as tolerated.        Relevant Medications   rosuvastatin (CRESTOR) 20 MG tablet   Other Visit Diagnoses     Primary hypertension       Relevant Medications   rosuvastatin (CRESTOR) 20 MG tablet   Other Relevant Orders   CBC with Differential/Platelet (Completed)   Comprehensive metabolic panel (Completed)   Lipid panel (Completed)   TSH (Completed)   Estrogen deficiency       Relevant Orders   DG Bone Density   History of colon polyps       Relevant Orders   Ambulatory referral to Gastroenterology      Meds ordered this encounter  Medications   rosuvastatin (CRESTOR) 20 MG tablet    Sig: TAKE 1 TABLET(20 MG) BY MOUTH DAILY    Dispense:  90 tablet    Refill:  1    I, Ann Held, DO, personally preformed the services described in this documentation.  All medical record entries made by the scribe were at my direction and in my presence.  I have reviewed the chart and discharge instructions (if applicable) and agree that the record reflects my personal performance and is accurate and complete. 01/17/2022   I,Amber Collins,acting as a scribe for Ann Held, DO.,have documented all relevant documentation on the behalf of Ann Held, DO,as directed by  Ann Held, DO while in the presence of Ann Held, DO.    Ann Held, DO

## 2022-01-17 NOTE — Assessment & Plan Note (Signed)
Encourage heart healthy diet such as MIND or DASH diet, increase exercise, avoid trans fats, simple carbohydrates and processed foods, consider a krill or fish or flaxseed oil cap daily.  °

## 2022-01-17 NOTE — Assessment & Plan Note (Signed)
Well controlled, no changes to meds. Encouraged heart healthy diet such as the DASH diet and exercise as tolerated.  °

## 2022-01-17 NOTE — Assessment & Plan Note (Signed)
Ghm utd Check labs  See AVS  

## 2022-01-17 NOTE — Patient Instructions (Signed)
Preventive Care 40-64 Years Old, Female Preventive care refers to lifestyle choices and visits with your health care provider that can promote health and wellness. Preventive care visits are also called wellness exams. What can I expect for my preventive care visit? Counseling Your health care provider may ask you questions about your: Medical history, including: Past medical problems. Family medical history. Pregnancy history. Current health, including: Menstrual cycle. Method of birth control. Emotional well-being. Home life and relationship well-being. Sexual activity and sexual health. Lifestyle, including: Alcohol, nicotine or tobacco, and drug use. Access to firearms. Diet, exercise, and sleep habits. Work and work environment. Sunscreen use. Safety issues such as seatbelt and bike helmet use. Physical exam Your health care provider will check your: Height and weight. These may be used to calculate your BMI (body mass index). BMI is a measurement that tells if you are at a healthy weight. Waist circumference. This measures the distance around your waistline. This measurement also tells if you are at a healthy weight and may help predict your risk of certain diseases, such as type 2 diabetes and high blood pressure. Heart rate and blood pressure. Body temperature. Skin for abnormal spots. What immunizations do I need?  Vaccines are usually given at various ages, according to a schedule. Your health care provider will recommend vaccines for you based on your age, medical history, and lifestyle or other factors, such as travel or where you work. What tests do I need? Screening Your health care provider may recommend screening tests for certain conditions. This may include: Lipid and cholesterol levels. Diabetes screening. This is done by checking your blood sugar (glucose) after you have not eaten for a while (fasting). Pelvic exam and Pap test. Hepatitis B test. Hepatitis C  test. HIV (human immunodeficiency virus) test. STI (sexually transmitted infection) testing, if you are at risk. Lung cancer screening. Colorectal cancer screening. Mammogram. Talk with your health care provider about when you should start having regular mammograms. This may depend on whether you have a family history of breast cancer. BRCA-related cancer screening. This may be done if you have a family history of breast, ovarian, tubal, or peritoneal cancers. Bone density scan. This is done to screen for osteoporosis. Talk with your health care provider about your test results, treatment options, and if necessary, the need for more tests. Follow these instructions at home: Eating and drinking  Eat a diet that includes fresh fruits and vegetables, whole grains, lean protein, and low-fat dairy products. Take vitamin and mineral supplements as recommended by your health care provider. Do not drink alcohol if: Your health care provider tells you not to drink. You are pregnant, may be pregnant, or are planning to become pregnant. If you drink alcohol: Limit how much you have to 0-1 drink a day. Know how much alcohol is in your drink. In the [place] one drink equals one 12 oz bottle of beer (355 mL), one 5 oz glass of wine (148 mL), or one 1 oz glass of hard liquor (44 mL). Lifestyle Brush your teeth every morning and night with fluoride toothpaste. Floss one time each day. Exercise for at least 30 minutes 5 or more days each week. Do not use any products that contain nicotine or tobacco. These products include cigarettes, chewing tobacco, and vaping devices, such as e-cigarettes. If you need help quitting, ask your health care provider. Do not use drugs. If you are sexually active, practice safe sex. Use a condom or other form of protection to   prevent STIs. If you do not wish to become pregnant, use a form of birth control. If you plan to become pregnant, see your health care provider for a  prepregnancy visit. Take aspirin only as told by your health care provider. Make sure that you understand how much to take and what form to take. Work with your health care provider to find out whether it is safe and beneficial for you to take aspirin daily. Find healthy ways to manage stress, such as: Meditation, yoga, or listening to music. Journaling. Talking to a trusted person. Spending time with friends and family. Minimize exposure to UV radiation to reduce your risk of skin cancer. Safety Always wear your seat belt while driving or riding in a vehicle. Do not drive: If you have been drinking alcohol. Do not ride with someone who has been drinking. When you are tired or distracted. While texting. If you have been using any mind-altering substances or drugs. Wear a helmet and other protective equipment during sports activities. If you have firearms in your house, make sure you follow all gun safety procedures. Seek help if you have been physically or sexually abused. What's next? Visit your health care provider once a year for an annual wellness visit. Ask your health care provider how often you should have your eyes and teeth checked. Stay up to date on all vaccines. This information is not intended to replace advice given to you by your health care provider. Make sure you discuss any questions you have with your health care provider. Document Revised: 08/26/2020 Document Reviewed: 08/26/2020 Elsevier Patient Education  [place].

## 2022-01-18 ENCOUNTER — Ambulatory Visit (HOSPITAL_BASED_OUTPATIENT_CLINIC_OR_DEPARTMENT_OTHER)
Admission: RE | Admit: 2022-01-18 | Discharge: 2022-01-18 | Disposition: A | Discharge status: Home / Self Care | Payer: Commercial Managed Care - HMO | Source: Ambulatory Visit | Attending: Family Medicine | Admitting: Family Medicine

## 2022-01-18 ENCOUNTER — Encounter (HOSPITAL_BASED_OUTPATIENT_CLINIC_OR_DEPARTMENT_OTHER): Payer: Self-pay

## 2022-01-18 DIAGNOSIS — Z1231 Encounter for screening mammogram for malignant neoplasm of breast: Secondary | ICD-10-CM | POA: Diagnosis present

## 2022-01-18 DIAGNOSIS — E2839 Other primary ovarian failure: Secondary | ICD-10-CM | POA: Insufficient documentation

## 2022-01-20 IMAGING — DX DG LUMBAR SPINE COMPLETE 4+V
5 series · 5 of 5 positions shown · non-contrast
Comparison: None.

CLINICAL DATA: Right leg pain

EXAM:
LUMBAR SPINE - COMPLETE 4+ VIEW

[l-spine ap]
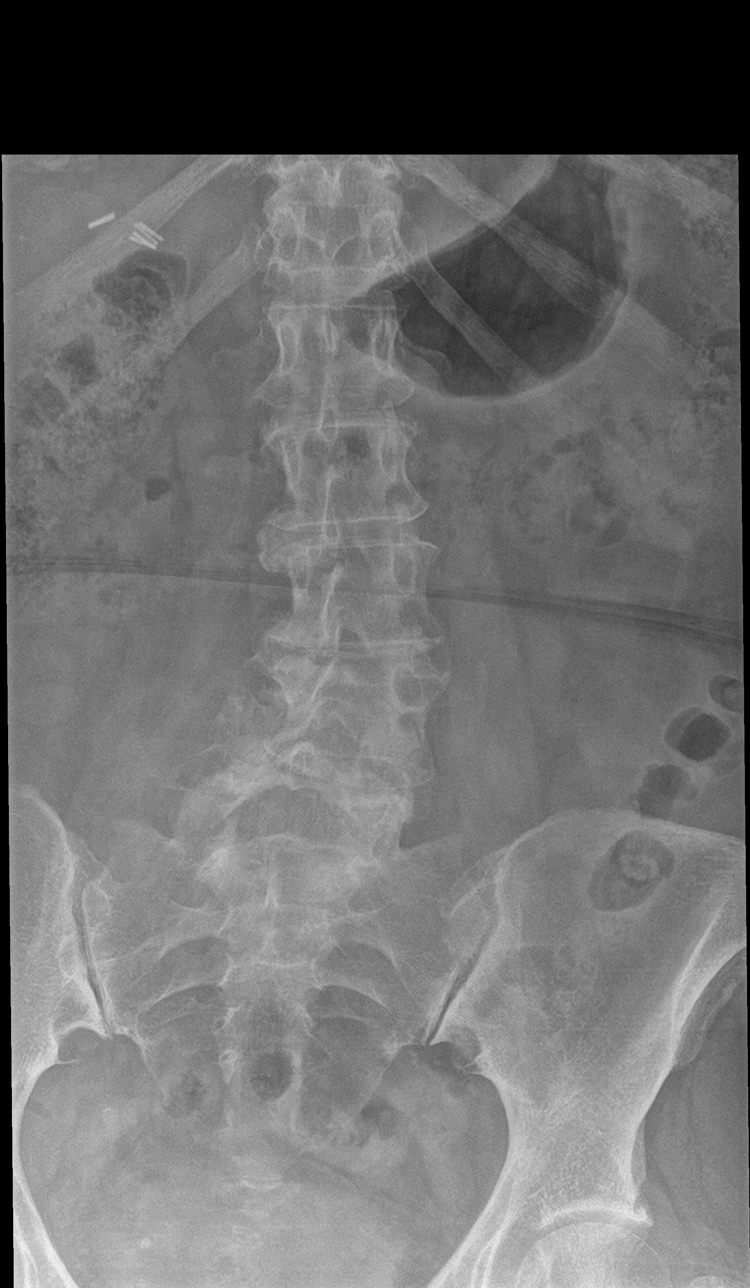

[l-spine obl (1 of 2)]
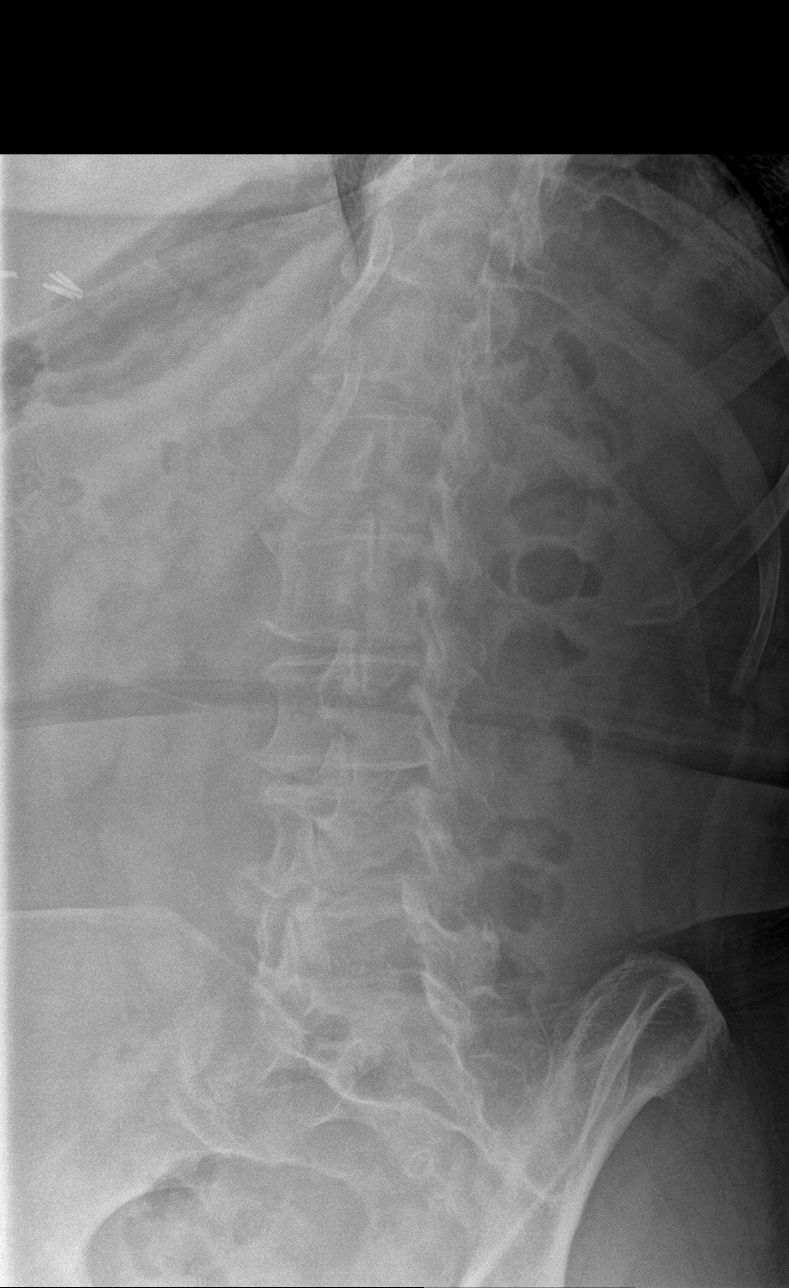

[l-spine obl (2 of 2)]
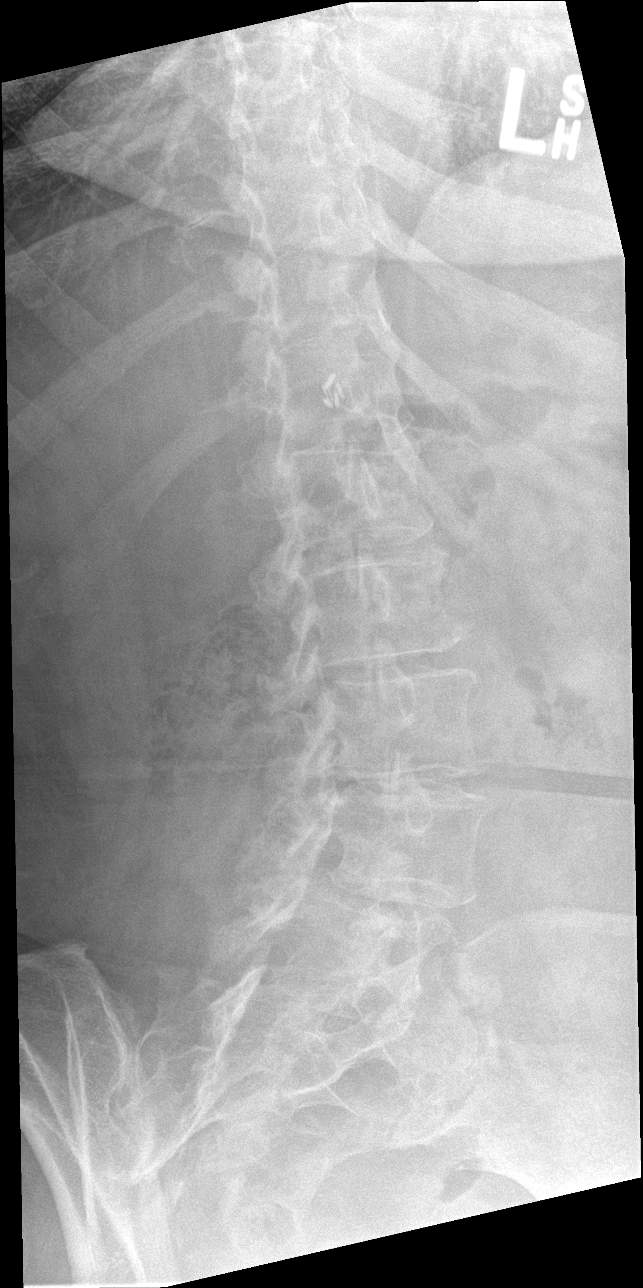

[l-spine lat]
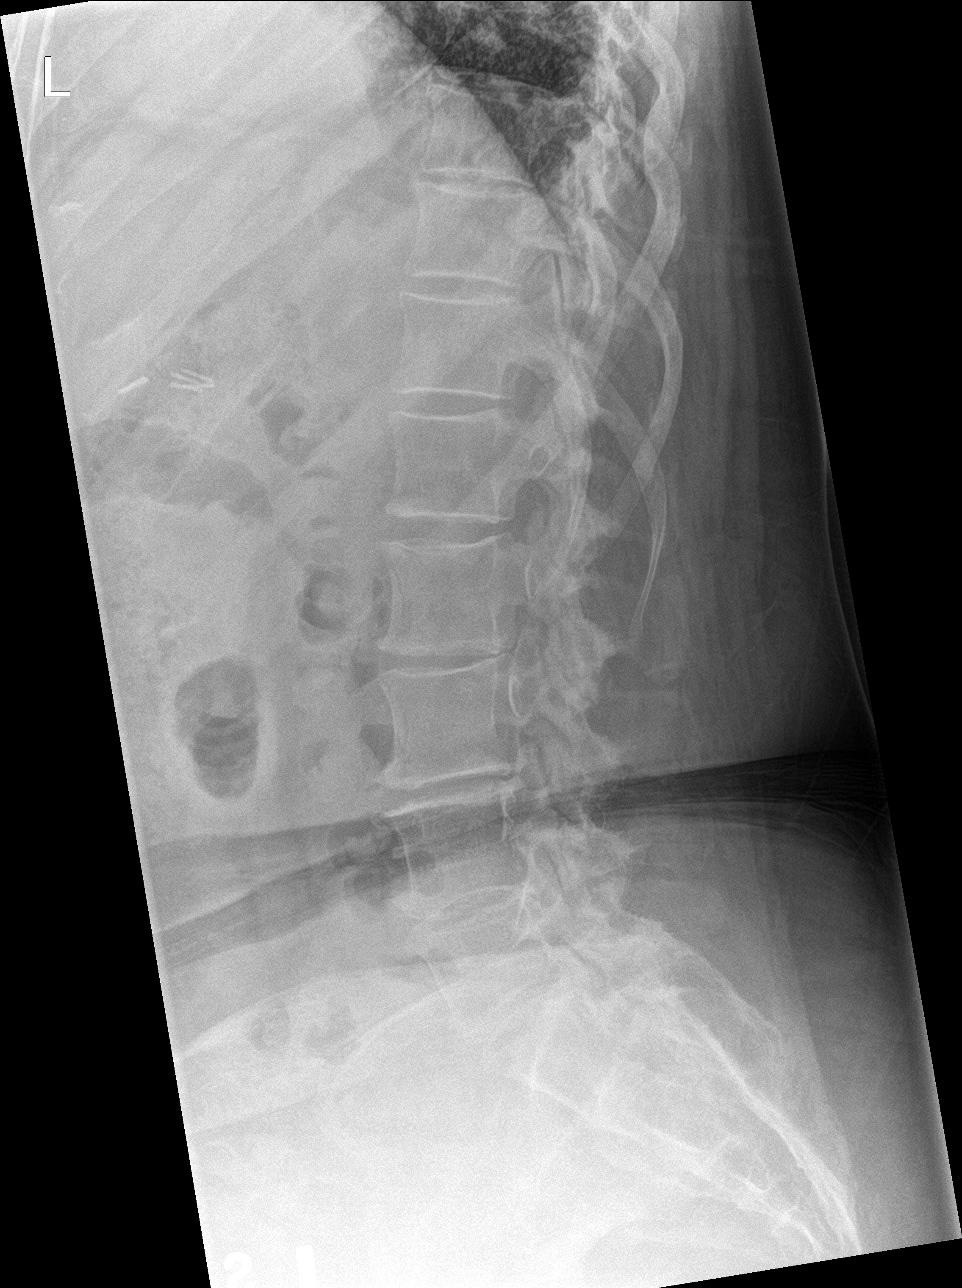

[l-spine spot]
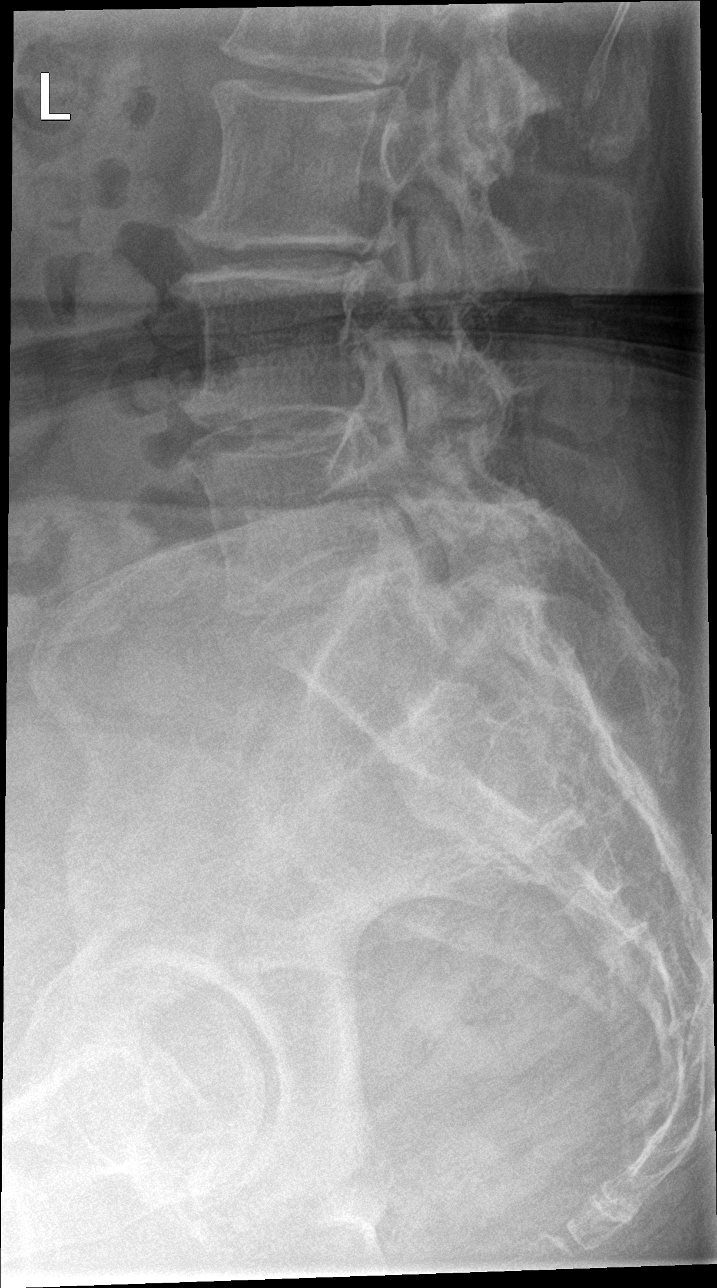

[5 of 5 positions shown; findings below may reference images not displayed]

FINDINGS: Five lumbar-type vertebral bodies. Mild lower lumbar levoscoliosis.
Normal lumbar lordosis.

No evidence of fracture or dislocation. Vertebral body heights are
maintained.

Mild degenerative changes of the lower lumbar spine.

Visualized bony pelvis appears intact.

Cholecystectomy clips.
IMPRESSION: Mild degenerative changes of the lower lumbar spine.

## 2022-01-23 ENCOUNTER — Other Ambulatory Visit: Payer: Self-pay | Admitting: Family Medicine

## 2022-01-23 DIAGNOSIS — E785 Hyperlipidemia, unspecified: Secondary | ICD-10-CM

## 2022-04-11 DIAGNOSIS — J0100 Acute maxillary sinusitis, unspecified: Secondary | ICD-10-CM | POA: Insufficient documentation

## 2022-04-11 HISTORY — DX: Acute maxillary sinusitis, unspecified: J01.00

## 2022-04-12 ENCOUNTER — Other Ambulatory Visit: Payer: Self-pay | Admitting: Family Medicine

## 2022-04-12 DIAGNOSIS — E785 Hyperlipidemia, unspecified: Secondary | ICD-10-CM

## 2022-05-30 ENCOUNTER — Other Ambulatory Visit: Payer: Self-pay | Admitting: Family Medicine

## 2022-05-30 DIAGNOSIS — E559 Vitamin D deficiency, unspecified: Secondary | ICD-10-CM

## 2022-06-24 ENCOUNTER — Encounter: Payer: Self-pay | Admitting: Gastroenterology

## 2022-06-27 ENCOUNTER — Encounter: Payer: Self-pay | Admitting: *Deleted

## 2022-07-07 ENCOUNTER — Ambulatory Visit (AMBULATORY_SURGERY_CENTER): Payer: Commercial Managed Care - HMO | Admitting: *Deleted

## 2022-07-07 VITALS — Ht 64.0 in | Wt 200.0 lb

## 2022-07-07 DIAGNOSIS — Z86010 Personal history of colonic polyps: Secondary | ICD-10-CM

## 2022-07-07 MED ORDER — NA SULFATE-K SULFATE-MG SULF 17.5-3.13-1.6 GM/177ML PO SOLN: 1.0000 | Freq: Once | ORAL | 0 refills | Status: AC

## 2022-07-07 NOTE — Progress Notes (Signed)

## 2022-07-18 ENCOUNTER — Ambulatory Visit (INDEPENDENT_AMBULATORY_CARE_PROVIDER_SITE_OTHER): Payer: Commercial Managed Care - HMO | Admitting: Family Medicine

## 2022-07-18 ENCOUNTER — Encounter: Payer: Self-pay | Admitting: Family Medicine

## 2022-07-18 VITALS — BP 112/78 | HR 65 | Temp 97.7°F | Resp 18 | Ht 64.0 in | Wt 202.4 lb

## 2022-07-18 DIAGNOSIS — I10 Essential (primary) hypertension: Secondary | ICD-10-CM

## 2022-07-18 DIAGNOSIS — E538 Deficiency of other specified B group vitamins: Secondary | ICD-10-CM | POA: Diagnosis not present

## 2022-07-18 DIAGNOSIS — Z6831 Body mass index (BMI) 31.0-31.9, adult: Secondary | ICD-10-CM

## 2022-07-18 DIAGNOSIS — E559 Vitamin D deficiency, unspecified: Secondary | ICD-10-CM | POA: Diagnosis not present

## 2022-07-18 DIAGNOSIS — E785 Hyperlipidemia, unspecified: Secondary | ICD-10-CM

## 2022-07-18 DIAGNOSIS — R600 Localized edema: Secondary | ICD-10-CM | POA: Diagnosis not present

## 2022-07-18 DIAGNOSIS — E6609 Other obesity due to excess calories: Secondary | ICD-10-CM

## 2022-07-18 LAB — CBC WITH DIFFERENTIAL/PLATELET
Basophils Absolute: 0 10*3/uL (ref 0.0–0.1)
Basophils Relative: 0.5 % (ref 0.0–3.0)
Eosinophils Absolute: 0.2 10*3/uL (ref 0.0–0.7)
Eosinophils Relative: 3.4 % (ref 0.0–5.0)
HCT: 40.1 % (ref 36.0–46.0)
Hemoglobin: 13.9 g/dL (ref 12.0–15.0)
Lymphocytes Relative: 29.5 % (ref 12.0–46.0)
Lymphs Abs: 1.7 10*3/uL (ref 0.7–4.0)
MCHC: 34.7 g/dL (ref 30.0–36.0)
MCV: 90 fl (ref 78.0–100.0)
Monocytes Absolute: 0.4 10*3/uL (ref 0.1–1.0)
Monocytes Relative: 7.8 % (ref 3.0–12.0)
Neutro Abs: 3.3 10*3/uL (ref 1.4–7.7)
Neutrophils Relative %: 58.8 % (ref 43.0–77.0)
Platelets: 230 10*3/uL (ref 150.0–400.0)
RBC: 4.46 Mil/uL (ref 3.87–5.11)
RDW: 13.3 % (ref 11.5–15.5)
WBC: 5.7 10*3/uL (ref 4.0–10.5)

## 2022-07-18 LAB — COMPREHENSIVE METABOLIC PANEL
ALT: 23 U/L (ref 0–35)
AST: 28 U/L (ref 0–37)
Albumin: 4.2 g/dL (ref 3.5–5.2)
Alkaline Phosphatase: 101 U/L (ref 39–117)
BUN: 18 mg/dL (ref 6–23)
CO2: 28 mEq/L (ref 19–32)
Calcium: 9.4 mg/dL (ref 8.4–10.5)
Chloride: 101 mEq/L (ref 96–112)
Creatinine, Ser: 0.89 mg/dL (ref 0.40–1.20)
GFR: 68.77 mL/min (ref >60.00)
Glucose, Bld: 100 mg/dL — ABNORMAL HIGH (ref 70–99)
Potassium: 3.7 mEq/L (ref 3.5–5.1)
Sodium: 139 mEq/L (ref 135–145)
Total Bilirubin: 0.7 mg/dL (ref 0.2–1.2)
Total Protein: 6.6 g/dL (ref 6.0–8.3)

## 2022-07-18 LAB — LIPID PANEL
Cholesterol: 165 mg/dL (ref 0–200)
HDL: 46.4 mg/dL (ref >39.00)
LDL Cholesterol: 88 mg/dL (ref 0–99)
NonHDL: 119.03
Total CHOL/HDL Ratio: 4
Triglycerides: 156 mg/dL — ABNORMAL HIGH (ref 0.0–149.0)
VLDL: 31.2 mg/dL (ref 0.0–40.0)

## 2022-07-18 LAB — VITAMIN B12: Vitamin B-12: 1469 pg/mL — ABNORMAL HIGH (ref 211–911)

## 2022-07-18 LAB — VITAMIN D 25 HYDROXY (VIT D DEFICIENCY, FRACTURES): VITD: 38.58 ng/mL (ref 30.00–100.00)

## 2022-07-18 MED ORDER — ROSUVASTATIN CALCIUM 20 MG PO TABS: ORAL_TABLET | ORAL | 1 refills | Status: DC

## 2022-07-18 MED ORDER — HYDROCHLOROTHIAZIDE 25 MG PO TABS: 25.0000 mg | ORAL_TABLET | Freq: Every day | ORAL | 3 refills | Status: DC

## 2022-07-18 NOTE — Assessment & Plan Note (Signed)
Tolerating statin, encouraged heart healthy diet, avoid trans fats, minimize simple carbs and saturated fats. Increase exercise as tolerated 

## 2022-07-18 NOTE — Progress Notes (Signed)
Established Patient Office Visit  Subjective   Patient ID: Tami Monroe, female    DOB: 1960/09/22  Age: 62 y.o. MRN: [id number]  Chief Complaint  Patient presents with   Hypertension   Follow-up   Hyperlipidemia    HPI Pt is here to f/u bp and chol.  She is frustrated with her weight gain and is asking for advice with that.   She was unsuccessful with HWW but is willing to try [facility] again since is online.  We also discussed My fitness pal APP,  keto diet-- (as long as she eats healthy fats), or intermittant fasting.   She will look into different eating plans and pick one---  we will check labs today and reassess at her cpe  Patient Active Problem List   Diagnosis Date Noted   Preventative health care 01/17/2022   Hyperlipidemia    Vitamin D deficiency 05/15/2018   Essential hypertension 04/04/2018   SI (sacroiliac) joint dysfunction 01/04/2018   Lower extremity edema 01/31/2016   Class 1 obesity due to excess calories with serious comorbidity and body mass index (BMI) of 31.0 to 31.9 in adult 01/31/2016   Past Medical History:  Diagnosis Date   Allergy    Arthritis    HIP,KNEE   Constipation    Hyperlipidemia    Hypertension    Joint pain    Past Surgical History:  Procedure Laterality Date   CHOLECYSTECTOMY  03/14/2009   COLONOSCOPY     Social History   Tobacco Use   Smoking status: Never   Smokeless tobacco: Never  Vaping Use   Vaping Use: Never used  Substance Use Topics   Alcohol use: Yes    Comment: OCC   Drug use: No   Social History   Socioeconomic History   Marital status: Single    Spouse name: Not on file   Number of children: Not on file   Years of education: Not on file   Highest education level: Not on file  Occupational History   Occupation: Production designer, theatre/television/film in customers    Employer: [employer]    Comment: retired 2021  Tobacco Use   Smoking status: Never   Smokeless tobacco: Never  Vaping Use   Vaping Use: Never used  Substance and  Sexual Activity   Alcohol use: Yes    Comment: OCC   Drug use: No   Sexual activity: Not Currently    Partners: Male  Other Topics Concern   Not on file  Social History Narrative   -Exercise--- walking 2 x a week   Social Determinants of Health   Financial Resource Strain: Not on file  Food Insecurity: Not on file  Transportation Needs: Not on file  Physical Activity: Not on file  Stress: Not on file  Social Connections: Not on file  Intimate Partner Violence: Not on file   Family Status  Relation Name Status   Mother  Deceased at age 74   Father  Deceased at age 70   Sister  Alive   Brother  Deceased   Brother  Armed forces training and education officer   Other  (Not Specified)   Neg Hx  (Not Specified)   Family History  Problem Relation Age of Onset   Arthritis Mother    Hyperlipidemia Mother    Heart disease Mother    Hypertension Mother    Diabetes Mother    Bladder Cancer Mother 40   Depression Mother    Obesity Mother    Lung cancer Father  smoker   Alcohol abuse Father    Obesity Father    Diabetes Sister 92   Vision loss Sister        optic nerve stroke   Stroke Sister    Diabetes Mellitus II Brother 60   Heart disease Brother    Crohn's disease Brother 66   Cancer Other        Ureter cancer   Colon cancer Neg Hx    Colon polyps Neg Hx    Esophageal cancer Neg Hx    Rectal cancer Neg Hx    Stomach cancer Neg Hx    Ulcerative colitis Neg Hx    No Known Allergies    Review of Systems  Constitutional:  Negative for fever and malaise/fatigue.  HENT:  Negative for congestion.   Eyes:  Negative for blurred vision.  Respiratory:  Negative for shortness of breath.   Cardiovascular:  Negative for chest pain, palpitations and leg swelling.  Gastrointestinal:  Negative for abdominal pain, blood in stool and nausea.  Genitourinary:  Negative for dysuria and frequency.  Musculoskeletal:  Negative for falls.  Skin:  Negative for rash.  Neurological:  Negative for dizziness, loss  of consciousness and headaches.  Endo/Heme/Allergies:  Negative for environmental allergies.  Psychiatric/Behavioral:  Negative for depression. The patient is not nervous/anxious.       Objective:     BP 112/78 (BP Location: Left Arm, Patient Position: Sitting, Cuff Size: Large)   Pulse 65   Temp 97.7 F (36.5 C) (Oral)   Resp 18   Ht 5\' 4"  (1.626 m)   Wt 202 lb 6.4 oz (91.8 kg)   SpO2 97%   BMI 34.74 kg/m  BP Readings from Last 3 Encounters:  07/18/22 112/78  01/17/22 110/70  07/12/21 112/78   Wt Readings from Last 3 Encounters:  07/18/22 202 lb 6.4 oz (91.8 kg)  07/07/22 200 lb (90.7 kg)  01/17/22 201 lb 12.8 oz (91.5 kg)   SpO2 Readings from Last 3 Encounters:  07/18/22 97%  01/17/22 96%  07/12/21 98%      Physical Exam Vitals and nursing note reviewed.  Constitutional:      Appearance: She is well-developed.  HENT:     Head: Normocephalic and atraumatic.  Eyes:     Conjunctiva/sclera: Conjunctivae normal.  Neck:     Thyroid: No thyromegaly.     Vascular: No carotid bruit or JVD.  Cardiovascular:     Rate and Rhythm: Normal rate and regular rhythm.     Heart sounds: Normal heart sounds. No murmur heard. Pulmonary:     Effort: Pulmonary effort is normal. No respiratory distress.     Breath sounds: Normal breath sounds. No wheezing or rales.  Chest:     Chest wall: No tenderness.  Musculoskeletal:     Cervical back: Normal range of motion and neck supple.  Neurological:     Mental Status: She is alert and oriented to person, place, and time.      No results found for any visits on 07/18/22.  Last CBC Lab Results  Component Value Date   WBC 6.8 01/17/2022   HGB 14.2 01/17/2022   HCT 41.8 01/17/2022   MCV 90.8 01/17/2022   MCH 32.0 01/09/2020   RDW 13.4 01/17/2022   PLT 239.0 01/17/2022   Last metabolic panel Lab Results  Component Value Date   GLUCOSE 81 01/17/2022   NA 138 01/17/2022   K 3.6 01/17/2022   CL 98 01/17/2022   CO2  30  01/17/2022   BUN 18 01/17/2022   CREATININE 0.88 01/17/2022   GFRNONAA 63 04/02/2018   CALCIUM 9.7 01/17/2022   PROT 6.9 01/17/2022   ALBUMIN 4.5 01/17/2022   LABGLOB 2.4 04/02/2018   AGRATIO 1.9 04/02/2018   BILITOT 0.7 01/17/2022   ALKPHOS 106 01/17/2022   AST 30 01/17/2022   ALT 25 01/17/2022   Last lipids Lab Results  Component Value Date   CHOL 164 01/17/2022   HDL 42.50 01/17/2022   LDLCALC 78 07/12/2021   LDLDIRECT 97.0 01/17/2022   TRIG 263.0 (H) 01/17/2022   CHOLHDL 4 01/17/2022   Last hemoglobin A1c Lab Results  Component Value Date   HGBA1C 5.5 04/02/2018   Last thyroid functions Lab Results  Component Value Date   TSH 1.55 01/17/2022   Last vitamin D Lab Results  Component Value Date   VD25OH 34.49 01/17/2022   Last vitamin B12 and Folate Lab Results  Component Value Date   VITAMINB12 >1506 (H) 06/11/2020      The 10-year ASCVD risk score (Arnett DK, et al., 2019) is: 4.7%    Assessment & Plan:   Problem List Items Addressed This Visit       Unprioritized   Vitamin D deficiency - Primary   Relevant Orders   VITAMIN D 25 Hydroxy (Vit-D Deficiency, Fractures)   Hyperlipidemia    Tolerating statin, encouraged heart healthy diet, avoid trans fats, minimize simple carbs and saturated fats. Increase exercise as tolerated       Relevant Medications   rosuvastatin (CRESTOR) 20 MG tablet   hydrochlorothiazide (HYDRODIURIL) 25 MG tablet   Other Relevant Orders   CBC with Differential/Platelet   Comprehensive metabolic panel   Lipid panel   Essential hypertension    Well controlled, no changes to meds. Encouraged heart healthy diet such as the DASH diet and exercise as tolerated.        Relevant Medications   rosuvastatin (CRESTOR) 20 MG tablet   hydrochlorothiazide (HYDRODIURIL) 25 MG tablet   Class 1 obesity due to excess calories with serious comorbidity and body mass index (BMI) of 31.0 to 31.9 in adult    D/w pt HWW--- she was  unsuccessful with this She will look at [facility] again or my fitness pal etc  F/u cpe       Other Visit Diagnoses     Bilateral edema of lower extremity       Relevant Medications   hydrochlorothiazide (HYDRODIURIL) 25 MG tablet   Other Relevant Orders   CBC with Differential/Platelet   Comprehensive metabolic panel   Lipid panel   Vitamin B12 deficiency       Relevant Orders   Vitamin B12       Return in about 6 months (around 01/18/2023), or if symptoms worsen or fail to improve, for annual exam, fasting.    Donato Schultz, DO

## 2022-07-18 NOTE — Assessment & Plan Note (Signed)
D/w pt HWW--- she was unsuccessful with this She will look at [facility] again or my fitness pal etc  F/u cpe

## 2022-07-18 NOTE — Patient Instructions (Signed)
Cholesterol Content in Foods ?Cholesterol is a waxy, fat-like substance that helps to carry fat in the blood. The body needs cholesterol in small amounts, but too much cholesterol can cause damage to the arteries and heart. ?What foods have cholesterol? ? ?Cholesterol is found in animal-based foods, such as meat, seafood, and dairy. Generally, low-fat dairy and lean meats have less cholesterol than full-fat dairy and fatty meats. The milligrams of cholesterol per serving (mg per serving) of common cholesterol-containing foods are listed below. ?Meats and other proteins ?Egg -- one large whole egg has 186 mg. ?Veal shank -- 4 oz (113 g) has 141 mg. ?Lean ground turkey (93% lean) -- 4 oz (113 g) has 118 mg. ?Fat-trimmed lamb loin -- 4 oz (113 g) has 106 mg. ?Lean ground beef (90% lean) -- 4 oz (113 g) has 100 mg. ?Lobster -- 3.5 oz (99 g) has 90 mg. ?Pork loin chops -- 4 oz (113 g) has 86 mg. ?Canned salmon -- 3.5 oz (99 g) has 83 mg. ?Fat-trimmed beef top loin -- 4 oz (113 g) has 78 mg. ?Frankfurter -- 1 frank (3.5 oz or 99 g) has 77 mg. ?Crab -- 3.5 oz (99 g) has 71 mg. ?Roasted chicken without skin, white meat -- 4 oz (113 g) has 66 mg. ?Light bologna -- 2 oz (57 g) has 45 mg. ?Deli-cut turkey -- 2 oz (57 g) has 31 mg. ?Canned tuna -- 3.5 oz (99 g) has 31 mg. ?Bacon -- 1 oz (28 g) has 29 mg. ?Oysters and mussels (raw) -- 3.5 oz (99 g) has 25 mg. ?Mackerel -- 1 oz (28 g) has 22 mg. ?Trout -- 1 oz (28 g) has 20 mg. ?Pork sausage -- 1 link (1 oz or 28 g) has 17 mg. ?Salmon -- 1 oz (28 g) has 16 mg. ?Tilapia -- 1 oz (28 g) has 14 mg. ?Dairy ?Soft-serve ice cream -- ? cup (4 oz or 86 g) has 103 mg. ?Whole-milk yogurt -- 1 cup (8 oz or 245 g) has 29 mg. ?Cheddar cheese -- 1 oz (28 g) has 28 mg. ?American cheese -- 1 oz (28 g) has 28 mg. ?Whole milk -- 1 cup (8 oz or 250 mL) has 23 mg. ?2% milk -- 1 cup (8 oz or 250 mL) has 18 mg. ?Cream cheese -- 1 tablespoon (Tbsp) (14.5 g) has 15 mg. ?Cottage cheese -- ? cup (4 oz or  113 g) has 14 mg. ?Low-fat (1%) milk -- 1 cup (8 oz or 250 mL) has 10 mg. ?Sour cream -- 1 Tbsp (12 g) has 8.5 mg. ?Low-fat yogurt -- 1 cup (8 oz or 245 g) has 8 mg. ?Nonfat Greek yogurt -- 1 cup (8 oz or 228 g) has 7 mg. ?Half-and-half cream -- 1 Tbsp (15 mL) has 5 mg. ?Fats and oils ?Cod liver oil -- 1 tablespoon (Tbsp) (13.6 g) has 82 mg. ?Butter -- 1 Tbsp (14 g) has 15 mg. ?Lard -- 1 Tbsp (12.8 g) has 14 mg. ?Bacon grease -- 1 Tbsp (12.9 g) has 14 mg. ?Mayonnaise -- 1 Tbsp (13.8 g) has 5-10 mg. ?Margarine -- 1 Tbsp (14 g) has 3-10 mg. ?The items listed above may not be a complete list of foods with cholesterol. Exact amounts of cholesterol in these foods may vary depending on specific ingredients and brands. Contact a dietitian for more information. ?What foods do not have cholesterol? ?Most plant-based foods do not have cholesterol unless you combine them with a food that has   cholesterol. Foods without cholesterol include: ?Grains and cereals. ?Vegetables. ?Fruits. ?Vegetable oils, such as olive, canola, and sunflower oil. ?Legumes, such as peas, beans, and lentils. ?Nuts and seeds. ?Egg whites. ?The items listed above may not be a complete list of foods that do not have cholesterol. Contact a dietitian for more information. ?Summary ?The body needs cholesterol in small amounts, but too much cholesterol can cause damage to the arteries and heart. ?Cholesterol is found in animal-based foods, such as meat, seafood, and dairy. Generally, low-fat dairy and lean meats have less cholesterol than full-fat dairy and fatty meats. ?This information is not intended to replace advice given to you by your health care provider. Make sure you discuss any questions you have with your health care provider. ?Document Revised: 07/10/2020 Document Reviewed: 07/10/2020 ?Elsevier Patient Education ? 2023 Else[employer] Inc[employer] ? ?

## 2022-07-18 NOTE — Assessment & Plan Note (Signed)
Well controlled, no changes to meds. Encouraged heart healthy diet such as the DASH diet and exercise as tolerated.  °

## 2022-07-20 ENCOUNTER — Encounter: Payer: Self-pay | Admitting: Gastroenterology

## 2022-08-02 ENCOUNTER — Ambulatory Visit: Payer: Commercial Managed Care - HMO | Admitting: Gastroenterology

## 2022-08-02 ENCOUNTER — Encounter: Payer: Self-pay | Admitting: Gastroenterology

## 2022-08-02 VITALS — BP 129/63 | HR 61 | Temp 98.0°F | Ht 65.0 in | Wt 200.0 lb

## 2022-08-02 MED ORDER — ONDANSETRON HCL 4 MG PO TABS: 4.0000 mg | ORAL_TABLET | ORAL | 0 refills | Status: DC

## 2022-08-02 MED ORDER — SODIUM CHLORIDE 0.9 % IV SOLN: 500.0000 mL | INTRAVENOUS | Status: DC

## 2022-08-02 NOTE — Progress Notes (Signed)
Patient states she did not finish Suprep, left 3/4 of 2nd bottle due to N/V.  States she is not clear.  Dr. Lavon Paganini informed, states she can either finish prep and r/s to this afternoon or reschedule to another day.  Patient informed, states she would rather r/s.

## 2022-08-02 NOTE — Progress Notes (Unsigned)
Pt rsc for 10/27/22 @11 :00 am.  New instructions given for Miralax with gatorade with 5 days of Miralax prior to procedure.   Also sent in RX for zofran for pt to take 30 mins prior.

## 2022-08-20 ENCOUNTER — Other Ambulatory Visit: Payer: Self-pay | Admitting: Family Medicine

## 2022-08-20 DIAGNOSIS — E559 Vitamin D deficiency, unspecified: Secondary | ICD-10-CM

## 2022-10-12 ENCOUNTER — Encounter: Payer: Commercial Managed Care - HMO | Admitting: Gastroenterology

## 2022-10-14 ENCOUNTER — Encounter: Payer: Self-pay | Admitting: Gastroenterology

## 2022-10-17 ENCOUNTER — Other Ambulatory Visit: Payer: Self-pay

## 2022-10-17 ENCOUNTER — Emergency Department (HOSPITAL_BASED_OUTPATIENT_CLINIC_OR_DEPARTMENT_OTHER): Payer: Commercial Managed Care - HMO

## 2022-10-17 ENCOUNTER — Telehealth: Payer: Self-pay | Admitting: Family Medicine

## 2022-10-17 ENCOUNTER — Emergency Department (HOSPITAL_BASED_OUTPATIENT_CLINIC_OR_DEPARTMENT_OTHER)
Admission: EM | Admit: 2022-10-17 | Discharge: 2022-10-17 | Disposition: A | Discharge status: Home / Self Care | Payer: Commercial Managed Care - HMO | Attending: Emergency Medicine | Admitting: Emergency Medicine

## 2022-10-17 DIAGNOSIS — R55 Syncope and collapse: Secondary | ICD-10-CM | POA: Diagnosis not present

## 2022-10-17 DIAGNOSIS — R42 Dizziness and giddiness: Secondary | ICD-10-CM | POA: Diagnosis not present

## 2022-10-17 DIAGNOSIS — I10 Essential (primary) hypertension: Secondary | ICD-10-CM | POA: Diagnosis not present

## 2022-10-17 DIAGNOSIS — Z79899 Other long term (current) drug therapy: Secondary | ICD-10-CM | POA: Insufficient documentation

## 2022-10-17 DIAGNOSIS — M542 Cervicalgia: Secondary | ICD-10-CM | POA: Insufficient documentation

## 2022-10-17 DIAGNOSIS — M25512 Pain in left shoulder: Secondary | ICD-10-CM | POA: Diagnosis not present

## 2022-10-17 LAB — URINALYSIS, ROUTINE W REFLEX MICROSCOPIC
Bilirubin Urine: NEGATIVE
Glucose, UA: NEGATIVE mg/dL
Hgb urine dipstick: NEGATIVE
Ketones, ur: NEGATIVE mg/dL
Nitrite: NEGATIVE
Protein, ur: NEGATIVE mg/dL
Specific Gravity, Urine: >=1.03 (ref 1.005–1.030)
pH: 5.5 (ref 5.0–8.0)

## 2022-10-17 LAB — BASIC METABOLIC PANEL
Anion gap: 12 (ref 5–15)
BUN: 18 mg/dL (ref 8–23)
CO2: 29 mmol/L (ref 22–32)
Calcium: 9.3 mg/dL (ref 8.9–10.3)
Chloride: 97 mmol/L — ABNORMAL LOW (ref 98–111)
Creatinine, Ser: 0.94 mg/dL (ref 0.44–1.00)
GFR, Estimated: >60 mL/min (ref >60)
Glucose, Bld: 149 mg/dL — ABNORMAL HIGH (ref 70–99)
Potassium: 3.4 mmol/L — ABNORMAL LOW (ref 3.5–5.1)
Sodium: 138 mmol/L (ref 135–145)

## 2022-10-17 LAB — URINALYSIS, MICROSCOPIC (REFLEX)

## 2022-10-17 LAB — CBC
HCT: 42.4 % (ref 36.0–46.0)
Hemoglobin: 14.3 g/dL (ref 12.0–15.0)
MCH: 30.2 pg (ref 26.0–34.0)
MCHC: 33.7 g/dL (ref 30.0–36.0)
MCV: 89.6 fL (ref 80.0–100.0)
Platelets: 254 10*3/uL (ref 150–400)
RBC: 4.73 MIL/uL (ref 3.87–5.11)
RDW: 12.9 % (ref 11.5–15.5)
WBC: 7 10*3/uL (ref 4.0–10.5)
nRBC: 0 % (ref 0.0–0.2)

## 2022-10-17 MED ORDER — SODIUM CHLORIDE 0.9 % IV BOLUS
1000.0000 mL | Freq: Once | INTRAVENOUS | Status: AC
  Administered 2022-10-17: 1000 mL via INTRAVENOUS

## 2022-10-17 MED ORDER — LIDOCAINE 5 % EX PTCH
1.0000 | MEDICATED_PATCH | Freq: Once | CUTANEOUS | Status: DC
  Administered 2022-10-17: 1 via TRANSDERMAL
  Filled 2022-10-17: qty 1

## 2022-10-17 MED ORDER — IOHEXOL 350 MG/ML SOLN
75.0000 mL | Freq: Once | INTRAVENOUS | Status: AC | PRN
  Administered 2022-10-17: 75 mL via INTRAVENOUS

## 2022-10-17 MED ORDER — LIDOCAINE 5 % EX PTCH: 1.0000 | MEDICATED_PATCH | CUTANEOUS | 0 refills | Status: AC

## 2022-10-17 NOTE — ED Triage Notes (Addendum)
Patient presents to ED via POV from home. Here with left shoulder pain x 2 weeks. Patient states "I think its just from my bra strap pinching". Denies injury or trauma. Patient reports feeling faint today.

## 2022-10-17 NOTE — Telephone Encounter (Signed)
Contact Type Call Who Is Calling Patient / Member / Family / Caregiver Call Type Triage / Clinical Relationship To Patient Self Return Phone Number [phone] (Primary) Chief Complaint Neck Pain Reason for Call Symptomatic / Request for Health Information Initial Comment Caller states she has been having shoulder and neck pain, felt pressure going up her neck felt like she was blacking out. Translation No Nurse Assessment Nurse: Myles Rosenthal, RN, Katelyn Date/Time (Eastern Time): 10/17/2022 2:29:41 PM Confirm and document reason for call. If symptomatic, describe symptoms. ---Caller states she was driving about an hour and started to feel pressure in her throat/neck, vision was blurry/ blackness and notes symptoms resolved. Right now feels light headed and nauseous. Denies headache or chest pain Has been having shoulder and neck pain and into jaw for a week now.  Final Disposition 10/17/2022 2:36:36 PM Go to ED Now Yes Cruise, RN, [employer]

## 2022-10-17 NOTE — ED Notes (Signed)
Patient transported to CT 

## 2022-10-17 NOTE — ED Provider Notes (Signed)
[place] [facility] Provider Note   CSN: [id number] Arrival date & time: 10/17/22  1452     History  Chief Complaint  Patient presents with   Dizziness    Tami Monroe is a 62 y.o. female with a history of hypertension, hyperlipidemia, and arthritis who presents to the ED today after a presyncopal event.  Patient reports she was driving in her car this morning when she felt pressure moving up the left anterior side of her neck causing vision loss on the bottom half of her eyes bilaterally.  This lasted a couple seconds and went away on its own.  She has never experienced anything like this before.  Patient reports she has been feeling lightheaded since this event.  Denies any weakness, numbness, or tingling. Additionally she reports left shoulder pain that began about a week ago that radiates up to the posterior left neck.  Denies any injury or trauma.  This pain has been gradually been getting better since it first began.  She reports that she feels her bra strap pinching the skin.  When she moves her bra strap off her shoulder, the pain gets better. She has taken Ibuprofen at home with some relief of symptoms.  Patient maintains full range of motion of her left arm as well as her neck.  No other complaints or concerns at this time.    Home Medications Prior to Admission medications   Medication Sig Start Date End Date Taking? Authorizing Provider  lidocaine (LIDODERM) 5 % Place 1 patch onto the skin daily for 7 days. Remove & Discard patch within 12 hours or as directed by MD 10/17/22 10/24/22 Yes Maxwell Marion, PA-C  albuterol (VENTOLIN HFA) 108 (90 Base) MCG/ACT inhaler 1 puff every 6 (six) hours. Patient not taking: Reported on 07/07/2022 04/11/22   [provider]  Cyanocobalamin (VITAMIN B-12 PO) Take by mouth. TAKE ONE EVERY OTHER DAY    [provider]  hydrochlorothiazide (HYDRODIURIL) 25 MG tablet Take 1 tablet (25 mg total) by  mouth daily. TAKE 1 TABLET(25 MG) BY MOUTH DAILY AS NEEDED Strength: 25 mg 07/18/22   Zola Button, Grayling Congress, DO  ibuprofen (ADVIL,MOTRIN) 200 MG tablet Take 200 mg by mouth as needed.    [provider]  Multiple Vitamins-Minerals (ICAPS AREDS 2 PO) Take by mouth daily. TAKE ONE DAILY    [provider]  ondansetron (ZOFRAN) 4 MG tablet Take 1 tablet (4 mg total) by mouth as directed. Take 1 tablet 30 minutes prior to starting prep 08/02/22   Napoleon Form, MD  rosuvastatin (CRESTOR) 20 MG tablet 1 po qd 07/18/22   Zola Button, Grayling Congress, DO  Vitamin D, Ergocalciferol, (DRISDOL) 1.25 MG (50000 UNIT) CAPS capsule TAKE 1 CAPSULE BY MOUTH EVERY 7 DAYS 08/22/22   Eulis Foster, FNP      Allergies    Patient has no known allergies.    Review of Systems   Review of Systems  Neurological:  Positive for light-headedness.  All other systems reviewed and are negative.   Physical Exam Updated Vital Signs BP (!) 145/79   Pulse 72   Temp 98 F (36.7 C) (Oral)   Resp 18   Ht 5\' 4"  (1.626 m)   Wt 90.7 kg   SpO2 98%   BMI 34.33 kg/m  Physical Exam Vitals and nursing note reviewed.  Constitutional:      Appearance: Normal appearance.  HENT:     Head: Normocephalic  and atraumatic.     Mouth/Throat:     Mouth: Mucous membranes are moist.  Eyes:     Conjunctiva/sclera: Conjunctivae normal.     Pupils: Pupils are equal, round, and reactive to light.  Cardiovascular:     Rate and Rhythm: Normal rate and regular rhythm.     Pulses: Normal pulses.     Heart sounds: Normal heart sounds.  Pulmonary:     Effort: Pulmonary effort is normal.     Breath sounds: Normal breath sounds.  Abdominal:     Palpations: Abdomen is soft.     Tenderness: There is no abdominal tenderness.  Musculoskeletal:        General: Tenderness present.     Comments: Tenderness to palpation of the left shoulder and left side of neck  Skin:    General: Skin is warm and dry.     Findings: No rash.   Neurological:     General: No focal deficit present.     Mental Status: She is alert.  Psychiatric:        Mood and Affect: Mood normal.        Behavior: Behavior normal.     ED Results / Procedures / Treatments   Labs (all labs ordered are listed, but only abnormal results are displayed) Labs Reviewed  BASIC METABOLIC PANEL - Abnormal; Notable for the following components:      Result Value   Potassium 3.4 (*)    Chloride 97 (*)    Glucose, Bld 149 (*)    All other components within normal limits  URINALYSIS, ROUTINE W REFLEX MICROSCOPIC - Abnormal; Notable for the following components:   Leukocytes,Ua TRACE (*)    All other components within normal limits  URINALYSIS, MICROSCOPIC (REFLEX) - Abnormal; Notable for the following components:   Bacteria, UA FEW (*)    All other components within normal limits  CBC    EKG EKG Interpretation Date/Time:  Monday October 17 2022 15:00:15 EDT Ventricular Rate:  73 PR Interval:  166 QRS Duration:  95 QT Interval:  404 QTC Calculation: 446 R Axis:   -48  Text Interpretation: Sinus rhythm LAD, consider left anterior fascicular block Low voltage, precordial leads Consider anterior infarct Confirmed by Glyn Ade [phone]) on 10/17/2022 9:15:28 PM  Radiology CT ANGIO HEAD NECK W WO CM  Result Date: 10/17/2022 CLINICAL DATA:  Left-sided neck pain EXAM: CT ANGIOGRAPHY HEAD AND NECK WITH AND WITHOUT CONTRAST TECHNIQUE: Multidetector CT imaging of the head and neck was performed using the standard protocol during bolus administration of intravenous contrast. Multiplanar CT image reconstructions and MIPs were obtained to evaluate the vascular anatomy. Carotid stenosis measurements (when applicable) are obtained utilizing NASCET criteria, using the distal internal carotid diameter as the denominator. RADIATION DOSE REDUCTION: This exam was performed according to the departmental dose-optimization program which includes automated exposure  control, adjustment of the mA and/or kV according to patient size and/or use of iterative reconstruction technique. CONTRAST:  75mL OMNIPAQUE IOHEXOL 350 MG/ML SOLN COMPARISON:  None Available. FINDINGS: CT HEAD FINDINGS Brain: There is no mass, hemorrhage or extra-axial collection. The size and configuration of the ventricles and extra-axial CSF spaces are normal. There is no acute or chronic infarction. The brain parenchyma is normal. Skull: The visualized skull base, calvarium and extracranial soft tissues are normal. Sinuses/Orbits: No fluid levels or advanced mucosal thickening of the visualized paranasal sinuses. No mastoid or middle ear effusion. The orbits are normal. CTA NECK FINDINGS SKELETON: There is no  bony spinal canal stenosis. No lytic or blastic lesion. OTHER NECK: Normal pharynx, larynx and major salivary glands. No cervical lymphadenopathy. Unremarkable thyroid gland. UPPER CHEST: No pneumothorax or pleural effusion. No nodules or masses. AORTIC ARCH: There is no calcific atherosclerosis of the aortic arch. Conventional 3 vessel aortic branching pattern. RIGHT CAROTID SYSTEM: Normal without aneurysm, dissection or stenosis. LEFT CAROTID SYSTEM: No dissection, occlusion or aneurysm. Mild atherosclerotic calcification at the carotid bifurcation without hemodynamically significant stenosis. VERTEBRAL ARTERIES: Left dominant configuration.There is no dissection, occlusion or flow-limiting stenosis to the skull base (V1-V3 segments). CTA HEAD FINDINGS POSTERIOR CIRCULATION: --Vertebral arteries: Normal V4 segments. --Inferior cerebellar arteries: Normal. --Basilar artery: Normal. --Superior cerebellar arteries: Normal. --Posterior cerebral arteries (PCA): Normal. ANTERIOR CIRCULATION: --Intracranial internal carotid arteries: Normal. --Anterior cerebral arteries (ACA): Normal. Both A1 segments are present. Patent anterior communicating artery (a-comm). --Middle cerebral arteries (MCA): Normal. VENOUS  SINUSES: As permitted by contrast timing, patent. ANATOMIC VARIANTS: None Review of the MIP images confirms the above findings. IMPRESSION: 1. No emergent large vessel occlusion or hemodynamically significant stenosis of the head or neck. 2. Mild left carotid bifurcation atherosclerosis without hemodynamically significant stenosis. Electronically Signed   By: Deatra Robinson M.D.   On: 10/17/2022 20:18    Procedures Procedures: not indicated.   Medications Ordered in ED Medications  lidocaine (LIDODERM) 5 % 1 patch (1 patch Transdermal Patch Applied 10/17/22 1826)  sodium chloride 0.9 % bolus 1,000 mL (1,000 mLs Intravenous New Bag/Given 10/17/22 1840)  iohexol (OMNIPAQUE) 350 MG/ML injection 75 mL (75 mLs Intravenous Contrast Given 10/17/22 1925)    ED Course/ Medical Decision Making/ A&P Clinical Course as of 10/17/22 2147  Mon Oct 17, 2022  1908 Stable 62 YOF left sided neck pain since earlier today. While she was driving. Pressure and then developed a near syncopal event.  It resolved spontaneously.  Comes in for further care and management.  She is ambulatory tolerating p.o. intake has been asymptomatic during her 7-hour observation in the emergency room.  She does have this intermittent left neck pain.  Seems inconsistent with ACS typical or atypical at this time.  Will refer to cardiology for workup presyncope further outpatient care management. [CC]    Clinical Course User Index [CC] Glyn Ade, MD                                 Medical Decision Making Amount and/or Complexity of Data Reviewed Labs: ordered.  Risk Prescription drug management.   This patient presents to the ED for concern of presyncope, this involves an extensive number of treatment options, and is a complaint that carries with it a high risk of complications and morbidity.   Differential diagnosis includes: CVA, stroke, dehydration, vasovagal syncope,   Co morbidities that complicate the patient  evaluation  Hypertension Arthritis Hyperlipidemia   Additional history  Additional history obtained from patient's records.   Cardiac Monitoring / EKG  The patient was maintained on a cardiac monitor.  I personally viewed and interpreted the cardiac monitored which showed: Sinus rhythm with a heart rate of 73 bpm.   Lab Tests  I ordered and personally interpreted labs.  The pertinent results include:   BMP is within normal limits - no acute electrolyte abnormality or AKI CBC is unremarkable  UA is shows trace leukocyte estrace but patient denies any urinary symptoms at this time - will not treat   Imaging Studies  I  ordered imaging studies including CTA head and neck.  I independently visualized and interpreted imaging which showed: No emergent large vessel occlusion or hemodynamically significant stenosis of the head or neck.  Mild left carotid bifurcation atherosclerosis without hemodynamically significant stenosis. I agree with the radiologist interpretation   Problem List / ED Course / Critical interventions / Medication management  Left anterior neck pain with partial loss of bottom half of vision bilaterally  I ordered medications including: Lidocaine patch for left shoulder pain Reevaluation of the patient after these medicines showed that the patient improved I have reviewed the patients home medicines and have made adjustments as needed   Social Determinants of Health  Access to healthcare   Test / Admission - Considered  Discussed results with patient. She is stable to be discharged home. Prescription for lidocaine patches sent to the pharmacy for left shoulder pain. Referral for cardiology provided to follow-up for presyncopal event. Return precautions provided.       Final Clinical Impression(s) / ED Diagnoses Final diagnoses:  Near syncope    Rx / DC Orders ED Discharge Orders          Ordered    Ambulatory referral to Cardiology        Comments: If you have not heard from the Cardiology office within the next 72 hours please call [phone].   10/17/22 2136    lidocaine (LIDODERM) 5 %  Every 24 hours        10/17/22 2142              Maxwell Marion, PA-C 10/17/22 2149    Glyn Ade, MD 10/17/22 2324

## 2022-10-17 NOTE — Telephone Encounter (Signed)
Pt went to ED

## 2022-10-17 NOTE — Discharge Instructions (Addendum)
As discussed, your CTA of the head and neck was unremarkable.  Due to your presyncopal symptoms, referral to cardiology has been sent.  You should expect to a call from them in the next couple days schedule an appointment. Your labs are within normal limits as well.   Get help right away if: You pass out or faint. You have any of these symptoms: Fast or uneven heartbeats (palpitations). Pain in your chest, belly, or back. Shortness of breath. You have a seizure. You have a very bad headache. You are confused. You have trouble seeing. You are very weak. You have trouble walking. You are bleeding from your mouth or butt. You have black or tarry poop (stool).

## 2022-10-17 NOTE — Telephone Encounter (Signed)
Pt states she is having shoulder pain along with feeling faint and wanted an appt. Transferred to triage.

## 2022-10-17 NOTE — Telephone Encounter (Signed)
Noted  

## 2022-10-21 ENCOUNTER — Ambulatory Visit: Payer: Commercial Managed Care - HMO | Admitting: Family Medicine

## 2022-10-21 VITALS — BP 120/80 | HR 58 | Temp 98.0°F | Resp 18 | Ht 64.0 in | Wt 205.6 lb

## 2022-10-21 DIAGNOSIS — R55 Syncope and collapse: Secondary | ICD-10-CM | POA: Diagnosis not present

## 2022-10-21 DIAGNOSIS — E6609 Other obesity due to excess calories: Secondary | ICD-10-CM

## 2022-10-21 DIAGNOSIS — I10 Essential (primary) hypertension: Secondary | ICD-10-CM

## 2022-10-21 DIAGNOSIS — Z6831 Body mass index (BMI) 31.0-31.9, adult: Secondary | ICD-10-CM

## 2022-10-21 DIAGNOSIS — N62 Hypertrophy of breast: Secondary | ICD-10-CM

## 2022-10-21 DIAGNOSIS — E785 Hyperlipidemia, unspecified: Secondary | ICD-10-CM

## 2022-10-21 MED ORDER — WEGOVY 0.25 MG/0.5ML ~~LOC~~ SOAJ
0.2500 mg | SUBCUTANEOUS | 0 refills | Status: DC
Start: 2022-10-21 — End: 2022-12-01

## 2022-10-21 NOTE — Assessment & Plan Note (Signed)
Tolerating statin, encouraged heart healthy diet, avoid trans fats, minimize simple carbs and saturated fats. Increase exercise as tolerated 

## 2022-10-21 NOTE — Assessment & Plan Note (Signed)
Pt would like to see if she can now get the wegovy since this episode

## 2022-10-21 NOTE — Patient Instructions (Signed)
Syncope, Adult  Syncope refers to a condition in which a person temporarily loses consciousness. Syncope may also be called fainting or passing out. It is caused by a sudden decrease in blood flow to the brain. This can happen for a variety of reasons. Most causes of syncope are not dangerous. It can be triggered by things such as needle sticks, seeing blood, pain, or intense emotion. However, syncope can also be a sign of a serious medical problem, such as a heart abnormality. Other causes can include dehydration, migraines, or taking medicines that lower blood pressure. Your health care provider may do tests to find the reason why you are having syncope. If you faint, get medical help right away. Call your local emergency services (911 in the [place] Follow these instructions at home: Pay attention to any changes in your symptoms. Take these actions to stay safe and to help relieve your symptoms: Knowing when you may be about to faint Signs that you may be about to faint include: Feeling dizzy, weak, light-headed, or like the room is spinning. Feeling nauseous. Seeing spots or seeing all white or all black in your field of vision. Having cold, clammy skin or feeling warm and sweaty. Hearing ringing in the ears (tinnitus). If you start to feel like you might faint, sit or lie down right away. If sitting, put your head down between your legs. If lying down, raise (elevate) your feet above the level of your heart. Breathe deeply and steadily. Wait until all the symptoms have passed. Have someone stay with you until you feel stable. Medicines Take over-the-counter and prescription medicines only as told by your health care provider. If you are taking blood pressure or heart medicine, get up slowly and take several minutes to sit and then stand. This can reduce dizziness and decrease the risk of syncope. Lifestyle Do not drive, use machinery, or play sports until your health care provider says it is  okay. Do not drink alcohol. Do not use any products that contain nicotine or tobacco. These products include cigarettes, chewing tobacco, and vaping devices, such as e-cigarettes. If you need help quitting, ask your health care provider. Avoid hot tubs and saunas. General instructions Talk with your health care provider about your symptoms. You may need to have testing to understand the cause of your syncope. Drink enough fluid to keep your urine pale yellow. Avoid prolonged standing. If you must stand for a long time, do movements such as: Moving your legs. Crossing your legs. Flexing and stretching your leg muscles. Squatting. Keep all follow-up visits. This is important. Contact a health care provider if: You have episodes of near fainting. Get help right away if: You faint. You hit your head or are injured after fainting. You have any of these symptoms that may indicate trouble with your heart: Fast or irregular heartbeats (palpitations). Unusual pain in your chest, abdomen, or back. Shortness of breath. You have a seizure. You have a severe headache. You are confused. You have vision problems. You have severe weakness or trouble walking. You are bleeding from your mouth or rectum, or you have black or tarry stool. These symptoms may represent a serious problem that is an emergency. Do not wait to see if your symptoms will go away. Get medical help right away. Call your local emergency services (911 in the [place] Do not drive yourself to the hospital. Summary Syncope refers to a condition in which a person temporarily loses consciousness. Syncope may also be called   fainting or passing out. It is caused by a sudden decrease in blood flow to the brain. Signs that you may be about to faint include dizziness, feeling light-headed, feeling nauseous, sudden vision changes, or cold, clammy skin. Even though most causes of syncope are not dangerous, syncope can be a sign of a serious  medical problem. Get help right away if you faint. If you start to feel like you might faint, sit or lie down right away. If sitting, put your head down between your legs. If lying down, raise (elevate) your feet above the level of your heart. This information is not intended to replace advice given to you by your health care provider. Make sure you discuss any questions you have with your health care provider. Document Revised: 07/09/2020 Document Reviewed: 07/09/2020 [employer]vier Patient Education  2024 [employer]  

## 2022-10-21 NOTE — Progress Notes (Signed)
Established Patient Office Visit  Subjective   Patient ID: Tami Monroe, female    DOB: 07/16/1960  Age: 62 y.o. MRN: [id number]  Chief Complaint  Patient presents with   ER follow up    Near syncope,     HPI Discussed the use of AI scribe software for clinical note transcription with the patient, who gave verbal consent to proceed.  History of Present Illness   The patient, with a history of large breasts causing neck pain, presents with a recent episode of near syncope. She describes a sensation of pressure rising in her neck, similar to choking, which was associated with a loss of vision. The vision loss was progressive, with a 'sliver of light' remaining before her vision returned to normal. The patient did not lose consciousness. The episode occurred while driving and was not associated with any particular stress or upset, although she had been running errands for several hours in hot weather and had not eaten much. She had also been wearing a bra for an extended period, which she believes may have contributed to the episode due to the discomfort and pinching sensation it causes in her neck. The patient continues to experience neck pain, which is exacerbated by wearing a bra and sometimes associated with a burning sensation in the back of her shoulder.      Patient Active Problem List   Diagnosis Date Noted   Preventative health care 01/17/2022   Hyperlipidemia    Vitamin D deficiency 05/15/2018   Essential hypertension 04/04/2018   SI (sacroiliac) joint dysfunction 01/04/2018   Lower extremity edema 01/31/2016   Class 1 obesity due to excess calories with serious comorbidity and body mass index (BMI) of 31.0 to 31.9 in adult 01/31/2016   Past Medical History:  Diagnosis Date   Allergy    Arthritis    HIP,KNEE   Constipation    Hyperlipidemia    Hypertension    Joint pain    Past Surgical History:  Procedure Laterality Date   CHOLECYSTECTOMY  03/14/2009   COLONOSCOPY      Social History   Tobacco Use   Smoking status: Never   Smokeless tobacco: Never  Vaping Use   Vaping status: Never Used  Substance Use Topics   Alcohol use: Yes    Comment: OCC   Drug use: No   Social History   Socioeconomic History   Marital status: Single    Spouse name: Not on file   Number of children: Not on file   Years of education: Not on file   Highest education level: Bachelor's degree (e.g., BA, AB, BS)  Occupational History   Occupation: Production designer, theatre/television/film in customers    Employer: [employer]    Comment: retired 2021  Tobacco Use   Smoking status: Never   Smokeless tobacco: Never  Vaping Use   Vaping status: Never Used  Substance and Sexual Activity   Alcohol use: Yes    Comment: OCC   Drug use: No   Sexual activity: Not Currently    Partners: Male  Other Topics Concern   Not on file  Social History Narrative   -Exercise--- walking 2 x a week   Social Determinants of Health   Financial Resource Strain: Low Risk  (10/21/2022)   Overall Financial Resource Strain (CARDIA)    Difficulty of Paying Living Expenses: Not very hard  Food Insecurity: No Food Insecurity (10/21/2022)   Hunger Vital Sign    Worried About Running Out of  Food in the Last Year: Never true    Ran Out of Food in the Last Year: Never true  Transportation Needs: No Transportation Needs (10/21/2022)   PRAPARE - Administrator, Civil Service (Medical): No    Lack of Transportation (Non-Medical): No  Physical Activity: Insufficiently Active (10/21/2022)   Exercise Vital Sign    Days of Exercise per Week: 3 days    Minutes of Exercise per Session: 40 min  Stress: No Stress Concern Present (10/21/2022)   [employer] of Occupational Health - Occupational Stress Questionnaire    Feeling of Stress : Only a little  Social Connections: Moderately Integrated (10/21/2022)   Social Connection and Isolation Panel [NHANES]    Frequency of Communication with Friends and Family: More than  three times a week    Frequency of Social Gatherings with Friends and Family: Twice a week    Attends Religious Services: More than 4 times per year    Active Member of [employer] or Organizations: Yes    Attends Engineer, structural: More than 4 times per year    Marital Status: Never married  Intimate Partner Violence: Unknown (06/17/2021)   Received from [facility], [facility]   HITS    Physically Hurt: Not on file    Insult or Talk Down To: Not on file    Threaten Physical Harm: Not on file    Scream or Curse: Not on file   Family Status  Relation Name Status   Mother  Deceased at age 78   Father  Deceased at age 31   Sister  Alive   Brother  Deceased   Brother  Armed forces training and education officer   Other  (Not Specified)   Neg Hx  (Not Specified)  No partnership data on file   Family History  Problem Relation Age of Onset   Arthritis Mother    Hyperlipidemia Mother    Heart disease Mother    Hypertension Mother    Diabetes Mother    Bladder Cancer Mother 72   Depression Mother    Obesity Mother    Lung cancer Father        smoker   Alcohol abuse Father    Obesity Father    Diabetes Sister 53   Vision loss Sister        optic nerve stroke   Stroke Sister    Diabetes Mellitus II Brother 60   Heart disease Brother    Crohn's disease Brother 68   Cancer Other        Ureter cancer   Colon cancer Neg Hx    Colon polyps Neg Hx    Esophageal cancer Neg Hx    Rectal cancer Neg Hx    Stomach cancer Neg Hx    Ulcerative colitis Neg Hx    No Known Allergies    Review of Systems  Constitutional:  Negative for fever and malaise/fatigue.  HENT:  Negative for congestion.   Eyes:  Negative for blurred vision.  Respiratory:  Negative for cough and shortness of breath.   Cardiovascular:  Negative for chest pain, palpitations and leg swelling.  Gastrointestinal:  Negative for abdominal pain, blood in stool, nausea and vomiting.  Genitourinary:  Negative for dysuria and frequency.   Musculoskeletal:  Negative for back pain and falls.  Skin:  Negative for rash.  Neurological:  Negative for dizziness, loss of consciousness and headaches.  Endo/Heme/Allergies:  Negative for environmental allergies.  Psychiatric/Behavioral:  Negative for depression. The patient  is not nervous/anxious.       Objective:     BP 120/80 (BP Location: Right Arm, Patient Position: Sitting, Cuff Size: Large)   Pulse (!) 58   Temp 98 F (36.7 C) (Oral)   Resp 18   Ht 5\' 4"  (1.626 m)   Wt 205 lb 9.6 oz (93.3 kg)   SpO2 95%   BMI 35.29 kg/m  BP Readings from Last 3 Encounters:  10/21/22 120/80  10/17/22 115/77  08/02/22 129/63   Wt Readings from Last 3 Encounters:  10/21/22 205 lb 9.6 oz (93.3 kg)  10/17/22 200 lb (90.7 kg)  08/02/22 200 lb (90.7 kg)   SpO2 Readings from Last 3 Encounters:  10/21/22 95%  10/17/22 97%  08/02/22 97%      Physical Exam Vitals and nursing note reviewed.  Constitutional:      General: She is not in acute distress.    Appearance: Normal appearance. She is well-developed.  HENT:     Head: Normocephalic and atraumatic.  Eyes:     General: No scleral icterus.       Right eye: No discharge.        Left eye: No discharge.  Cardiovascular:     Rate and Rhythm: Normal rate and regular rhythm.     Heart sounds: No murmur heard. Pulmonary:     Effort: Pulmonary effort is normal. No respiratory distress.     Breath sounds: Normal breath sounds.  Musculoskeletal:        General: Normal range of motion.     Cervical back: Normal range of motion and neck supple.     Right lower leg: No edema.     Left lower leg: No edema.  Skin:    General: Skin is warm and dry.  Neurological:     Mental Status: She is alert and oriented to person, place, and time.  Psychiatric:        Mood and Affect: Mood normal.        Behavior: Behavior normal.        Thought Content: Thought content normal.        Judgment: Judgment normal.      No results found for  any visits on 10/21/22.  Last CBC Lab Results  Component Value Date   WBC 7.0 10/17/2022   HGB 14.3 10/17/2022   HCT 42.4 10/17/2022   MCV 89.6 10/17/2022   MCH 30.2 10/17/2022   RDW 12.9 10/17/2022   PLT 254 10/17/2022   Last metabolic panel Lab Results  Component Value Date   GLUCOSE 149 (H) 10/17/2022   NA 138 10/17/2022   K 3.4 (L) 10/17/2022   CL 97 (L) 10/17/2022   CO2 29 10/17/2022   BUN 18 10/17/2022   CREATININE 0.94 10/17/2022   GFRNONAA >60 10/17/2022   CALCIUM 9.3 10/17/2022   PROT 6.6 07/18/2022   ALBUMIN 4.2 07/18/2022   LABGLOB 2.4 04/02/2018   AGRATIO 1.9 04/02/2018   BILITOT 0.7 07/18/2022   ALKPHOS 101 07/18/2022   AST 28 07/18/2022   ALT 23 07/18/2022   ANIONGAP 12 10/17/2022   Last lipids Lab Results  Component Value Date   CHOL 165 07/18/2022   HDL 46.40 07/18/2022   LDLCALC 88 07/18/2022   LDLDIRECT 97.0 01/17/2022   TRIG 156.0 (H) 07/18/2022   CHOLHDL 4 07/18/2022   Last hemoglobin A1c Lab Results  Component Value Date   HGBA1C 5.5 04/02/2018   Last thyroid functions Lab Results  Component Value Date  TSH 1.55 01/17/2022   Last vitamin D Lab Results  Component Value Date   VD25OH 38.58 07/18/2022   Last vitamin B12 and Folate Lab Results  Component Value Date   VITAMINB12 1,469 (H) 07/18/2022      The 10-year ASCVD risk score (Arnett DK, et al., 2019) is: 5.7%    Assessment & Plan:   Problem List Items Addressed This Visit       Unprioritized   Hyperlipidemia - Primary    Tolerating statin, encouraged heart healthy diet, avoid trans fats, minimize simple carbs and saturated fats. Increase exercise as tolerated       Relevant Medications   Semaglutide-Weight Management (WEGOVY) 0.25 MG/0.5ML SOAJ   Essential hypertension    Well controlled, no changes to meds. Encouraged heart healthy diet such as the DASH diet and exercise as tolerated.        Relevant Medications   Semaglutide-Weight Management (WEGOVY)  0.25 MG/0.5ML SOAJ   Class 1 obesity due to excess calories with serious comorbidity and body mass index (BMI) of 31.0 to 31.9 in adult    Pt would like to see if she can now get the wegovy since this episode      Relevant Medications   Semaglutide-Weight Management (WEGOVY) 0.25 MG/0.5ML SOAJ   Other Visit Diagnoses     Near syncope       Relevant Medications   Semaglutide-Weight Management (WEGOVY) 0.25 MG/0.5ML SOAJ   Large breasts       Relevant Orders   Ambulatory referral to Plastic Surgery     Assessment and Plan    Near Syncope Episode of near syncope while driving, with associated neck pressure and transient vision loss. No recurrence. No associated palpitations or shortness of breath. Blood work normal. -Referred to cardiology for further evaluation. Appointment scheduled for September.  Neck Pain Chronic neck pain, exacerbated by bra strap pressure due to large breast size. Pain is worse on the left side and sometimes associated with a burning sensation in the back of the shoulder. -Referred to plastic surgery for consultation regarding potential breast reduction surgery.  Weight Management Patient expresses interest in weight loss to potentially alleviate neck pain. Discussed potential use of Wegovy, pending insurance approval. -Submitted request for [phone] to insurance. -Follow-up in October for annual visit.        Return if symptoms worsen or fail to improve.    Donato Schultz, DO

## 2022-10-21 NOTE — Assessment & Plan Note (Signed)
Well controlled, no changes to meds. Encouraged heart healthy diet such as the DASH diet and exercise as tolerated.  °

## 2022-10-24 ENCOUNTER — Telehealth: Payer: Self-pay | Admitting: *Deleted

## 2022-10-24 ENCOUNTER — Telehealth: Payer: Self-pay | Admitting: Gastroenterology

## 2022-10-24 NOTE — Telephone Encounter (Signed)
Pt is using miralax prep and has understanding of using miralax once or twice daily for 5 days prior to colonoscopy as well.

## 2022-10-24 NOTE — Telephone Encounter (Signed)
I called this pt and spoke with her, she had miralax instructions in from her office visit from May

## 2022-10-24 NOTE — Telephone Encounter (Signed)
Attempt to reach pt to answer questions about prep. No answer. New Suprep instructions sent to my chart.

## 2022-10-24 NOTE — Telephone Encounter (Signed)
Attempt to reach pt for new prep instructions. No answer. New Suprep instructions sent to my chart.

## 2022-10-24 NOTE — Telephone Encounter (Signed)
Patient called would like to discuss her prep instructions with a nurse. Has questions on the Miralax dosage along with the recommended Miralax with Gatorade.

## 2022-10-27 ENCOUNTER — Ambulatory Visit (AMBULATORY_SURGERY_CENTER): Payer: Commercial Managed Care - HMO | Admitting: Gastroenterology

## 2022-10-27 ENCOUNTER — Encounter: Payer: Self-pay | Admitting: Gastroenterology

## 2022-10-27 VITALS — BP 111/69 | HR 61 | Temp 98.7°F | Resp 22 | Ht 65.0 in | Wt 200.0 lb

## 2022-10-27 DIAGNOSIS — D122 Benign neoplasm of ascending colon: Secondary | ICD-10-CM

## 2022-10-27 DIAGNOSIS — K635 Polyp of colon: Secondary | ICD-10-CM | POA: Diagnosis not present

## 2022-10-27 DIAGNOSIS — Z09 Encounter for follow-up examination after completed treatment for conditions other than malignant neoplasm: Secondary | ICD-10-CM

## 2022-10-27 DIAGNOSIS — Z86010 Personal history of colonic polyps: Secondary | ICD-10-CM

## 2022-10-27 DIAGNOSIS — D125 Benign neoplasm of sigmoid colon: Secondary | ICD-10-CM | POA: Diagnosis not present

## 2022-10-27 MED ORDER — SODIUM CHLORIDE 0.9 % IV SOLN: 500.0000 mL | Freq: Once | INTRAVENOUS | Status: DC

## 2022-10-27 NOTE — Progress Notes (Signed)
[facility] History and Physical   Primary Care Physician:  Zola Button, Grayling Congress, DO   Reason for Procedure:  History of adenomatous colon polyps  Plan:    Surveillance colonoscopy with possible interventions as needed     HPI: Tami Monroe is a very pleasant 62 y.o. female here for surveillance colonoscopy. Denies any nausea, vomiting, abdominal pain, melena or bright red blood per rectum  The risks and benefits as well as alternatives of endoscopic procedure(s) have been discussed and reviewed. All questions answered. The patient agrees to proceed.    Past Medical History:  Diagnosis Date   Allergy    Arthritis    HIP,KNEE   Constipation    Hyperlipidemia    Hypertension    Joint pain     Past Surgical History:  Procedure Laterality Date   CHOLECYSTECTOMY  03/14/2009   COLONOSCOPY      Prior to Admission medications   Medication Sig Start Date End Date Taking? Authorizing Provider  Cyanocobalamin (VITAMIN B-12 PO) Take by mouth. TAKE ONE EVERY OTHER DAY    [provider]  hydrochlorothiazide (HYDRODIURIL) 25 MG tablet Take 1 tablet (25 mg total) by mouth daily. TAKE 1 TABLET(25 MG) BY MOUTH DAILY AS NEEDED Strength: 25 mg 07/18/22   Zola Button, Grayling Congress, DO  ibuprofen (ADVIL,MOTRIN) 200 MG tablet Take 200 mg by mouth as needed.    [provider]  Multiple Vitamins-Minerals (ICAPS AREDS 2 PO) Take by mouth daily. TAKE ONE DAILY    [provider]  ondansetron (ZOFRAN) 4 MG tablet Take 1 tablet (4 mg total) by mouth as directed. Take 1 tablet 30 minutes prior to starting prep 08/02/22   Napoleon Form, MD  rosuvastatin (CRESTOR) 20 MG tablet 1 po qd 07/18/22   Zola Button, Grayling Congress, DO  Semaglutide-Weight Management (WEGOVY) 0.25 MG/0.5ML SOAJ Inject 0.25 mg into the skin once a week. 10/21/22   Donato Schultz, DO  Vitamin D, Ergocalciferol, (DRISDOL) 1.25 MG (50000 UNIT) CAPS capsule TAKE 1 CAPSULE BY MOUTH EVERY 7 DAYS  08/22/22   Worthy Rancher B, FNP    Current Outpatient Medications  Medication Sig Dispense Refill   Cyanocobalamin (VITAMIN B-12 PO) Take by mouth. TAKE ONE EVERY OTHER DAY     hydrochlorothiazide (HYDRODIURIL) 25 MG tablet Take 1 tablet (25 mg total) by mouth daily. TAKE 1 TABLET(25 MG) BY MOUTH DAILY AS NEEDED Strength: 25 mg 90 tablet 3   ibuprofen (ADVIL,MOTRIN) 200 MG tablet Take 200 mg by mouth as needed.     Multiple Vitamins-Minerals (ICAPS AREDS 2 PO) Take by mouth daily. TAKE ONE DAILY     ondansetron (ZOFRAN) 4 MG tablet Take 1 tablet (4 mg total) by mouth as directed. Take 1 tablet 30 minutes prior to starting prep 2 tablet 0   rosuvastatin (CRESTOR) 20 MG tablet 1 po qd 90 tablet 1   Semaglutide-Weight Management (WEGOVY) 0.25 MG/0.5ML SOAJ Inject 0.25 mg into the skin once a week. 2 mL 0   Vitamin D, Ergocalciferol, (DRISDOL) 1.25 MG (50000 UNIT) CAPS capsule TAKE 1 CAPSULE BY MOUTH EVERY 7 DAYS 12 capsule 0   Current Facility-Administered Medications  Medication Dose Route Frequency Provider Last Rate Last Admin   0.9 %  sodium chloride infusion  500 mL Intravenous Continuous Leon Montoya V, MD       0.9 %  sodium chloride infusion  500 mL Intravenous Continuous Alayzha An, [name] Chiquito, MD        Allergies  as of 10/27/2022   (No Known Allergies)    Family History  Problem Relation Age of Onset   Arthritis Mother    Hyperlipidemia Mother    Heart disease Mother    Hypertension Mother    Diabetes Mother    Bladder Cancer Mother 40   Depression Mother    Obesity Mother    Lung cancer Father        smoker   Alcohol abuse Father    Obesity Father    Diabetes Sister 28   Vision loss Sister        optic nerve stroke   Stroke Sister    Diabetes Mellitus II Brother 60   Heart disease Brother    Crohn's disease Brother 25   Cancer Other        Ureter cancer   Colon cancer Neg Hx    Colon polyps Neg Hx    Esophageal cancer Neg Hx    Rectal cancer Neg Hx     Stomach cancer Neg Hx    Ulcerative colitis Neg Hx     Social History   Socioeconomic History   Marital status: Single    Spouse name: Not on file   Number of children: Not on file   Years of education: Not on file   Highest education level: Bachelor's degree (e.g., BA, AB, BS)  Occupational History   Occupation: Production designer, theatre/television/film in customers    Employer: [employer]    Comment: retired 2021  Tobacco Use   Smoking status: Never   Smokeless tobacco: Never  Vaping Use   Vaping status: Never Used  Substance and Sexual Activity   Alcohol use: Yes    Comment: OCC   Drug use: No   Sexual activity: Not Currently    Partners: Male  Other Topics Concern   Not on file  Social History Narrative   -Exercise--- walking 2 x a week   Social Determinants of Health   Financial Resource Strain: Low Risk  (10/21/2022)   Overall Financial Resource Strain (CARDIA)    Difficulty of Paying Living Expenses: Not very hard  Food Insecurity: No Food Insecurity (10/21/2022)   Hunger Vital Sign    Worried About Running Out of Food in the Last Year: Never true    Ran Out of Food in the Last Year: Never true  Transportation Needs: No Transportation Needs (10/21/2022)   PRAPARE - Administrator, Civil Service (Medical): No    Lack of Transportation (Non-Medical): No  Physical Activity: Insufficiently Active (10/21/2022)   Exercise Vital Sign    Days of Exercise per Week: 3 days    Minutes of Exercise per Session: 40 min  Stress: No Stress Concern Present (10/21/2022)   [employer] of Occupational Health - Occupational Stress Questionnaire    Feeling of Stress : Only a little  Social Connections: Moderately Integrated (10/21/2022)   Social Connection and Isolation Panel [NHANES]    Frequency of Communication with Friends and Family: More than three times a week    Frequency of Social Gatherings with Friends and Family: Twice a week    Attends Religious Services: More than 4 times per year     Active Member of [employer] or Organizations: Yes    Attends Banker Meetings: More than 4 times per year    Marital Status: Never married  Intimate Partner Violence: Unknown (06/17/2021)   Received from [facility], [facility]   HITS    Physically  Hurt: Not on file    Insult or Talk Down To: Not on file    Threaten Physical Harm: Not on file    Scream or Curse: Not on file    Review of Systems:  All other review of systems negative except as mentioned in the HPI.  Physical Exam: Vital signs in last 24 hours: There were no vitals taken for this visit. General:   Alert, NAD Lungs:  Clear .   Heart:  Regular rate and rhythm Abdomen:  Soft, nontender and nondistended. Neuro/Psych:  Alert and cooperative. Normal mood and affect. A and O x 3  Reviewed labs, radiology imaging, old records and pertinent past GI work up  Patient is appropriate for planned procedure(s) and anesthesia in an ambulatory setting   K. Scherry Ran , MD [phone]

## 2022-10-27 NOTE — Patient Instructions (Signed)
Handout on polyps, diverticulosis, and hemorrhoids given to patient  Await pathology results Resume previous diet and continue present medications  Repeat colonoscopy in 5 years for surveillance   YOU HAD AN ENDOSCOPIC PROCEDURE TODAY AT THE [facility]:   Refer to the procedure report that was given to you for any specific questions about what was found during the examination.  If the procedure report does not answer your questions, please call your gastroenterologist to clarify.  If you requested that your care partner not be given the details of your procedure findings, then the procedure report has been included in a sealed envelope for you to review at your convenience later.  YOU SHOULD EXPECT: Some feelings of bloating in the abdomen. Passage of more gas than usual.  Walking can help get rid of the air that was put into your GI tract during the procedure and reduce the bloating. If you had a lower endoscopy (such as a colonoscopy or flexible sigmoidoscopy) you may notice spotting of blood in your stool or on the toilet paper. If you underwent a bowel prep for your procedure, you may not have a normal bowel movement for a few days.  Please Note:  You might notice some irritation and congestion in your nose or some drainage.  This is from the oxygen used during your procedure.  There is no need for concern and it should clear up in a day or so.  SYMPTOMS TO REPORT IMMEDIATELY:  Following lower endoscopy (colonoscopy or flexible sigmoidoscopy):  Excessive amounts of blood in the stool  Significant tenderness or worsening of abdominal pains  Swelling of the abdomen that is new, acute  Fever of 100F or higher  For urgent or emergent issues, a gastroenterologist can be reached at any hour by calling [phone]. Do not use MyChart messaging for urgent concerns.    DIET:  We do recommend a small meal at first, but then you may proceed to your regular diet.  Drink plenty of  fluids but you should avoid alcoholic beverages for 24 hours.  ACTIVITY:  You should plan to take it easy for the rest of today and you should NOT DRIVE or use heavy machinery until tomorrow (because of the sedation medicines used during the test).    FOLLOW UP: Our staff will call the number listed on your records the next business day following your procedure.  We will call around 7:15- 8:00 am to check on you and address any questions or concerns that you may have regarding the information given to you following your procedure. If we do not reach you, we will leave a message.     If any biopsies were taken you will be contacted by phone or by letter within the next 1-3 weeks.  Please call us at [phone] if you have not heard about the biopsies in 3 weeks.    SIGNATURES/CONFIDENTIALITY: You and/or your care partner have signed paperwork which will be entered into your electronic medical record.  These signatures attest to the fact that that the information above on your After Visit Summary has been reviewed and is understood.  Full responsibility of the confidentiality of this discharge information lies with you and/or your care-partner.

## 2022-10-27 NOTE — Progress Notes (Signed)
Sedate, gd SR, tolerated procedure well, VSS, report to RN 

## 2022-10-27 NOTE — Progress Notes (Signed)
Pt's states no medical or surgical changes since previsit or office visit. 

## 2022-10-27 NOTE — Op Note (Signed)
[facility] Patient Name: Tami Monroe Procedure Date: 10/27/2022 11:03 AM MRN: [id number] Endoscopist: Napoleon Form , MD, [id number] Age: 62 Referring MD:  Date of Birth: 28-Aug-1960 Gender: Female Account #: [id number] Procedure:                Colonoscopy Indications:              High risk colon cancer surveillance: Personal                            history of colonic polyps, High risk colon cancer                            surveillance: Personal history of adenoma (10 mm or                            greater in size) Medicines:                Monitored Anesthesia Care Procedure:                Pre-Anesthesia Assessment:                           - Prior to the procedure, a History and Physical                            was performed, and patient medications and                            allergies were reviewed. The patient's tolerance of                            previous anesthesia was also reviewed. The risks                            and benefits of the procedure and the sedation                            options and risks were discussed with the patient.                            All questions were answered, and informed consent                            was obtained. Prior Anticoagulants: The patient has                            taken no anticoagulant or antiplatelet agents. ASA                            Grade Assessment: II - A patient with mild systemic                            disease. After reviewing the risks and benefits,  the patient was deemed in satisfactory condition to                            undergo the procedure.                           After obtaining informed consent, the colonoscope                            was passed under direct vision. Throughout the                            procedure, the patient's blood pressure, pulse, and                            oxygen saturations were monitored  continuously. The                            Olympus Scope [id number] was introduced through the                            anus and advanced to the the cecum, identified by                            appendiceal orifice and ileocecal valve. The                            colonoscopy was performed without difficulty. The                            patient tolerated the procedure well. The quality                            of the bowel preparation was good. The ileocecal                            valve, appendiceal orifice, and rectum were                            photographed. Scope In: 11:07:31 AM Scope Out: 11:26:54 AM Scope Withdrawal Time: 0 hours 14 minutes 56 seconds  Total Procedure Duration: 0 hours 19 minutes 23 seconds  Findings:                 The perianal and digital rectal examinations were                            normal.                           A 7 mm polyp was found in the ascending colon. The                            polyp was sessile. The polyp was removed with a  cold snare. Resection and retrieval were complete.                           A 5 mm polyp was found in the sigmoid colon. The                            polyp was sessile. The polyp was removed with a                            cold snare. Resection and retrieval were complete.                           Scattered medium-mouthed and small-mouthed                            diverticula were found in the sigmoid colon and                            descending colon.                           Non-bleeding external and internal hemorrhoids were                            found during retroflexion. The hemorrhoids were                            small. Complications:            No immediate complications. Estimated Blood Loss:     Estimated blood loss was minimal. Impression:               - One 7 mm polyp in the ascending colon, removed                            with a cold  snare. Resected and retrieved.                           - One 5 mm polyp in the sigmoid colon, removed with                            a cold snare. Resected and retrieved.                           - Diverticulosis in the sigmoid colon and in the                            descending colon.                           - Non-bleeding external and internal hemorrhoids. Recommendation:           - Patient has a contact number available for  emergencies. The signs and symptoms of potential                            delayed complications were discussed with the                            patient. Return to normal activities tomorrow.                            Written discharge instructions were provided to the                            patient.                           - Resume previous diet.                           - Continue present medications.                           - Await pathology results.                           - Repeat colonoscopy in 5 years for surveillance                            based on pathology results. Napoleon Form, MD 10/27/2022 11:34:01 AM This report has been signed electronically.

## 2022-10-27 NOTE — Progress Notes (Signed)
Called to room to assist during endoscopic procedure.  Patient ID and intended procedure confirmed with present staff. Received instructions for my participation in the procedure from the performing physician.  

## 2022-10-28 ENCOUNTER — Telehealth: Payer: Self-pay | Admitting: *Deleted

## 2022-10-28 NOTE — Telephone Encounter (Signed)
  Follow up Call-     10/27/2022   10:38 AM  Call back number  Post procedure Call Back phone  # [phone]  Permission to leave phone message Yes     Patient questions:  Do you have a fever, pain , or abdominal swelling? No. Pain Score  0 *  Have you tolerated food without any problems? Yes.    Have you been able to return to your normal activities? Yes.    Do you have any questions about your discharge instructions: Diet   No. Medications  No. Follow up visit  No.  Do you have questions or concerns about your Care? No.  Actions: * If pain score is 4 or above: No action needed, pain <4.

## 2022-11-02 ENCOUNTER — Encounter: Payer: Self-pay | Admitting: Gastroenterology

## 2022-11-30 ENCOUNTER — Other Ambulatory Visit: Payer: Self-pay

## 2022-11-30 DIAGNOSIS — K5900 Constipation, unspecified: Secondary | ICD-10-CM | POA: Insufficient documentation

## 2022-11-30 DIAGNOSIS — I10 Essential (primary) hypertension: Secondary | ICD-10-CM | POA: Insufficient documentation

## 2022-11-30 DIAGNOSIS — M2550 Pain in unspecified joint: Secondary | ICD-10-CM | POA: Insufficient documentation

## 2022-11-30 DIAGNOSIS — M1990 Unspecified osteoarthritis, unspecified site: Secondary | ICD-10-CM | POA: Insufficient documentation

## 2022-11-30 DIAGNOSIS — T7840XA Allergy, unspecified, initial encounter: Secondary | ICD-10-CM | POA: Insufficient documentation

## 2022-12-01 ENCOUNTER — Encounter: Payer: Self-pay | Admitting: Cardiology

## 2022-12-01 ENCOUNTER — Ambulatory Visit: Payer: Commercial Managed Care - HMO | Attending: Cardiology | Admitting: Cardiology

## 2022-12-01 VITALS — BP 112/70 | HR 63 | Ht 64.0 in | Wt 204.1 lb

## 2022-12-01 DIAGNOSIS — E785 Hyperlipidemia, unspecified: Secondary | ICD-10-CM

## 2022-12-01 DIAGNOSIS — I6529 Occlusion and stenosis of unspecified carotid artery: Secondary | ICD-10-CM | POA: Diagnosis not present

## 2022-12-01 DIAGNOSIS — R079 Chest pain, unspecified: Secondary | ICD-10-CM

## 2022-12-01 DIAGNOSIS — R011 Cardiac murmur, unspecified: Secondary | ICD-10-CM | POA: Diagnosis not present

## 2022-12-01 DIAGNOSIS — E669 Obesity, unspecified: Secondary | ICD-10-CM | POA: Insufficient documentation

## 2022-12-01 NOTE — Progress Notes (Signed)
Cardiology Office Note:    Date:  12/01/2022   ID:  Tami Monroe, DOB August 31, 1960, MRN [id number]  PCP:  Zola Button, Grayling Congress, DO  Cardiologist:  Garwin Brothers, MD   Referring MD: Maxwell Marion, PA-C    ASSESSMENT:    1. Hyperlipidemia, unspecified hyperlipidemia type   2. Carotid atherosclerosis, unspecified laterality   3. Chest pain of uncertain etiology   4. Cardiac murmur    PLAN:    In order of problems listed above:  Primary prevention stressed with the patient.  Importance of compliance with diet medication stressed and patient verbalized standing. Chest pain: Not typical for coronary etiology however in view of risk factors we will do an exercise stress Cardiolite.  She is agreeable. Cardiac murmur: Echocardiogram will be done to assess murmur heard on auscultation. Mixed dyslipidemia and obesity: Lifestyle modification urged.  Risks of obesity explained and she promises to do better.  Also she is on statin therapy.  In view of cerebrovascular atherosclerosis goal LDL must be less than 60.  This is followed by primary care.  Patient promises to do better. Patient will be seen in follow-up appointment in 6 months or earlier if the patient has any concerns.    Medication Adjustments/Labs and Tests Ordered: Current medicines are reviewed at length with the patient today.  Concerns regarding medicines are outlined above.  Orders Placed This Encounter  Procedures   EKG 12-Lead   No orders of the defined types were placed in this encounter.    History of Present Illness:    Tami Monroe is a 62 y.o. female who is being seen today for the evaluation of chest pain at the request of Maxwell Marion, PA-C.  Patient is a pleasant 62 year old female.  She has past medical history of mixed dyslipidemia.  She has carotid atherosclerosis.  Or other cerebrovascular atherosclerosis.  She mentions to me that she had pain like episode in the upper chest and neck while driving and this  resolved.  She leads a sedentary lifestyle.  This chest pain has not occurred.  No history of hypertension diabetes mellitus.  She is obese and leads a sedentary lifestyle.  At the time of my evaluation, the patient is alert awake oriented and in no distress.  This episode has concerned her and she is here for evaluation.  Past Medical History:  Diagnosis Date   Acute maxillary sinusitis, unspecified 04/11/2022   Allergy    Arthritis    HIP,KNEE   Class 1 obesity due to excess calories with serious comorbidity and body mass index (BMI) of 31.0 to 31.9 in adult 01/31/2016   Constipation    Essential hypertension 04/04/2018   Hyperlipidemia    Hypertension    Joint pain    Lower extremity edema 01/31/2016   Preventative health care 01/17/2022   SI (sacroiliac) joint dysfunction 01/04/2018   Vitamin D deficiency 05/15/2018    Past Surgical History:  Procedure Laterality Date   CHOLECYSTECTOMY  03/14/2009   COLONOSCOPY      Current Medications: Current Meds  Medication Sig   Cyanocobalamin (VITAMIN B-12 PO) Take 1 tablet by mouth daily.   hydrochlorothiazide (HYDRODIURIL) 25 MG tablet Take 1 tablet (25 mg total) by mouth daily. TAKE 1 TABLET(25 MG) BY MOUTH DAILY AS NEEDED Strength: 25 mg   ibuprofen (ADVIL,MOTRIN) 200 MG tablet Take 200 mg by mouth as needed for headache or mild pain.   Multiple Vitamins-Minerals (ICAPS AREDS 2 PO) Take 1 tablet by mouth daily.  rosuvastatin (CRESTOR) 20 MG tablet 1 po qd   Vitamin D, Ergocalciferol, (DRISDOL) 1.25 MG (50000 UNIT) CAPS capsule TAKE 1 CAPSULE BY MOUTH EVERY 7 DAYS     Allergies:   Patient has no known allergies.   Social History   Socioeconomic History   Marital status: Single    Spouse name: Not on file   Number of children: Not on file   Years of education: Not on file   Highest education level: Bachelor's degree (e.g., BA, AB, BS)  Occupational History   Occupation: Production designer, theatre/television/film in customers    Employer: [employer]    Comment: retired 2021  Tobacco Use   Smoking status: Never   Smokeless tobacco: Never  Vaping Use   Vaping status: Never Used  Substance and Sexual Activity   Alcohol use: Yes    Comment: O[facility]   Drug use: No   Sexual activity: Not Currently    Partners: Male  Other Topics Concern   Not on file  Social History Narrative   -Exercise--- walking 2 x a week   Social Determinants of Health   Financial Resource Strain: Low Risk  (10/21/2022)   Overall Financial Resource Strain (CARDIA)    Difficulty of Paying Living Expenses: Not very hard  Food Insecurity: No Food Insecurity (10/21/2022)   Hunger Vital Sign    Worried About Running Out of Food in the Last Year: Never true    Ran Out of Food in the Last Year: Never true  Transportation Needs: No Transportation Needs (10/21/2022)   PRAPARE - Administrator, Civil Service (Medical): No    Lack of Transportation (Non-Medical): No  Physical Activity: Insufficiently Active (10/21/2022)   Exercise Vital Sign    Days of Exercise per Week: 3 days    Minutes of Exercise per Session: 40 min  Stress: No Stress Concern Present (10/21/2022)   [employer] of Occupational Health - Occupational Stress Questionnaire    Feeling of Stress : Only a little  Social Connections: Moderately Integrated (10/21/2022)   Social Connection and Isolation Panel [NHANES]    Frequency of Communication with Friends and Family: More than three times a week    Frequency of Social Gatherings with Friends and Family: Twice a week    Attends Religious Services: More than 4 times per year    Active Member of [employer] or Organizations: Yes    Attends Engineer, structural: More than 4 times per year    Marital Status: Never married     Family History: The patient's family history includes Alcohol abuse in her father; Arthritis in her mother; Bladder Cancer (age of onset: 24) in her mother; Cancer in an other family member; Crohn's disease (age of  onset: 8) in her brother; Depression in her mother; Diabetes in her mother; Diabetes (age of onset: 63) in her sister; Diabetes Mellitus II (age of onset: 61) in her brother; Heart disease in her brother and mother; Hyperlipidemia in her mother; Hypertension in her mother; Lung cancer in her father; Obesity in her father and mother; Stroke in her sister; Vision loss in her sister. There is no history of Colon cancer, Colon polyps, Esophageal cancer, Rectal cancer, Stomach cancer, or Ulcerative colitis.  ROS:   Please see the history of present illness.    All other systems reviewed and are negative.  EKGs/Labs/Other Studies Reviewed:    The following studies were reviewed today:  EKG Interpretation Date/Time:  Thursday December 01 2022  13:42:58 EDT Ventricular Rate:  63 PR Interval:  172 QRS Duration:  86 QT Interval:  416 QTC Calculation: 425 R Axis:   -72  Text Interpretation: Normal sinus rhythm Left axis deviation Anterior infarct , age undetermined When compared with ECG of 17-Oct-2022 15:00, PREVIOUS ECG IS PRESENT Confirmed by Belva Crome [id number]) on 12/01/2022 1:52:42 PM     Recent Labs: 01/17/2022: TSH 1.55 07/18/2022: ALT 23 10/17/2022: BUN 18; Creatinine, Ser 0.94; Hemoglobin 14.3; Platelets 254; Potassium 3.4; Sodium 138  Recent Lipid Panel    Component Value Date/Time   CHOL 165 07/18/2022 0856   TRIG 156.0 (H) 07/18/2022 0856   HDL 46.40 07/18/2022 0856   CHOLHDL 4 07/18/2022 0856   VLDL 31.2 07/18/2022 0856   LDLCALC 88 07/18/2022 0856   LDLCALC 175 (H) 01/09/2020 0903   LDLDIRECT 97.0 01/17/2022 1333    Physical Exam:    VS:  BP 112/70   Pulse 63   Ht 5\' 4"  (1.626 m)   Wt 204 lb 1.3 oz (92.6 kg)   SpO2 97%   BMI 35.03 kg/m     Wt Readings from Last 3 Encounters:  12/01/22 204 lb 1.3 oz (92.6 kg)  10/27/22 200 lb (90.7 kg)  10/21/22 205 lb 9.6 oz (93.3 kg)     GEN: Patient is in no acute distress HEENT: Normal NECK: No JVD; No carotid  bruits LYMPHATICS: No lymphadenopathy CARDIAC: S1 S2 regular, 2/6 systolic murmur at the apex. RESPIRATORY:  Clear to auscultation without rales, wheezing or rhonchi  ABDOMEN: Soft, non-tender, non-distended MUSCULOSKELETAL:  No edema; No deformity  SKIN: Warm and dry NEUROLOGIC:  Alert and oriented x 3 PSYCHIATRIC:  Normal affect    Signed, Garwin Brothers, MD  12/01/2022 2:07 PM    [facility]

## 2022-12-01 NOTE — Patient Instructions (Signed)
Medication Instructions:  Your physician recommends that you continue on your current medications as directed. Please refer to the Current Medication list given to you today.  *If you need a refill on your cardiac medications before your next appointment, please call your pharmacy*   Lab Work: None ordered If you have labs (blood work) drawn today and your tests are completely normal, you will receive your results only by: MyChart Message (if you have MyChart) OR A paper copy in the mail If you have any lab test that is abnormal or we need to change your treatment, we will call you to review the results.   Testing/Procedures: You are scheduled for a Myocardial Perfusion Imaging Study.  Please arrive 15 minutes prior to your appointment time for registration and insurance purposes.  The test will take approximately 3 to 4 hours to complete; you may bring reading material.  If someone comes with you to your appointment, they will need to remain in the main lobby due to limited space in the testing area.   How to prepare for your Myocardial Perfusion Test: Do not eat or drink 3 hours prior to your test, except you may have water. Do not consume products containing caffeine (regular or decaffeinated) 12 hours prior to your test. (ex: coffee, chocolate, sodas, tea). Do bring a list of your current medications with you.  If not listed below, you may take your medications as normal. Do wear comfortable clothes (no dresses or overalls) and walking shoes, tennis shoes preferred (No heels or open toe shoes are allowed). Do NOT wear cologne, perfume, aftershave, or lotions (deodorant is allowed). If these instructions are not followed, your test will have to be rescheduled.  If you cannot keep your appointment, please provide 24 hours notification to the [facility], to avoid a possible $50 charge to your account.   Your physician has requested that you have an echocardiogram. Echocardiography is  a painless test that uses sound waves to create images of your heart. It provides your doctor with information about the size and shape of your heart and how well your heart's chambers and valves are working. This procedure takes approximately one hour. There are no restrictions for this procedure. Please do NOT wear cologne, perfume, aftershave, or lotions (deodorant is allowed). Please arrive 15 minutes prior to your appointment time.   Follow-Up: At [facility], you and your health needs are our priority.  As part of our continuing mission to provide you with exceptional heart care, we have created designated Provider Care Teams.  These Care Teams include your primary Cardiologist (physician) and Advanced Practice Providers (APPs -  Physician Assistants and Nurse Practitioners) who all work together to provide you with the care you need, when you need it.  We recommend signing up for the patient portal called "MyChart".  Sign up information is provided on this After Visit Summary.  MyChart is used to connect with patients for Virtual Visits (Telemedicine).  Patients are able to view lab/test results, encounter notes, upcoming appointments, etc.  Non-urgent messages can be sent to your provider as well.   To learn more about what you can do with MyChart, go to [email or web address].    Your next appointment:   9 month(s)  Provider:   Belva Crome, MD   Other Instructions  Cardiac Nuclear Scan A cardiac nuclear scan is a test that is done to check the flow of blood to your heart. It is done when you are resting and  when you are exercising. The test looks for problems such as: Not enough blood reaching a portion of the heart. The heart muscle not working as it should. You may need this test if you have: Heart disease. Lab results that are not normal. Had heart surgery or a balloon procedure to open up blocked arteries (angioplasty) or a small mesh tube (stent). Chest  pain. Shortness of breath. Had a heart attack. In this test, a special dye (tracer) is put into your bloodstream. The tracer will travel to your heart. A camera will then take pictures of your heart to see how the tracer moves through your heart. This test is usually done at a hospital and takes 2-4 hours. Tell a doctor about: Any allergies you have. All medicines you are taking, including vitamins, herbs, eye drops, creams, and over-the-counter medicines. Any bleeding problems you have. Any surgeries you have had. Any medical conditions you have. Whether you are pregnant or may be pregnant. Any history of asthma or long-term (chronic) lung disease. Any history of heart rhythm disorders or heart valve conditions. What are the risks? Your doctor will talk with you about risks. These may include: Serious chest pain and heart attack. This is only a risk if the stress portion of the test is done. Fast or uneven heartbeats (palpitations). A feeling of warmth in your chest. This feeling usually does not last long. Allergic reaction to the tracer. Shortness of breath or trouble breathing. What happens before the test? Ask your doctor about changing or stopping your normal medicines. Follow instructions from your doctor about what you cannot eat or drink. Remove your jewelry on the day of the test. Ask your doctor if you need to avoid nicotine or caffeine. What happens during the test? An IV tube will be inserted into one of your veins. Your doctor will give you a small amount of tracer through the IV tube. You will wait for 20-40 minutes while the tracer moves through your bloodstream. Your heart will be monitored with an electrocardiogram (ECG). You will lie down on an exam table. Pictures of your heart will be taken for about 15-20 minutes. You may also have a stress test. For this test, one of these things may be done: You will be asked to exercise on a treadmill or a stationary  bike. You will be given medicines that will make your heart work harder. This is done if you are unable to exercise. When blood flow to your heart has peaked, a tracer will again be given through the IV tube. After 20-40 minutes, you will get back on the exam table. More pictures will be taken of your heart. Depending on the tracer that is used, more pictures may need to be taken 3-4 hours later. Your IV tube will be removed when the test is over. The test may vary among doctors and hospitals. What happens after the test? Ask your doctor: Whether you can return to your normal schedule, including diet, activities, travel, and medicines. Whether you should drink more fluids. This will help to remove the tracer from your body. Ask your doctor, or the department that is doing the test: When will my results be ready? How will I get my results? What are my treatment options? What other tests do I need? What are my next steps? This information is not intended to replace advice given to you by your health care provider. Make sure you discuss any questions you have with your health care provider.  Document Revised: 07/27/2021 Document Reviewed: 07/27/2021 Elsevier Patient Education  2023 [employer]  Echocardiogram An echocardiogram is a test that uses sound waves (ultrasound) to produce images of the heart. Images from an echocardiogram can provide important information about: Heart size and shape. The size and thickness and movement of your heart's walls. Heart muscle function and strength. Heart valve function or if you have stenosis. Stenosis is when the heart valves are too narrow. If blood is flowing backward through the heart valves (regurgitation). A tumor or infectious growth around the heart valves. Areas of heart muscle that are not working well because of poor blood flow or injury from a heart attack. Aneurysm detection. An aneurysm is a weak or damaged part of an artery wall. The  wall bulges out from the normal force of blood pumping through the body. Tell a health care provider about: Any allergies you have. All medicines you are taking, including vitamins, herbs, eye drops, creams, and over-the-counter medicines. Any blood disorders you have. Any surgeries you have had. Any medical conditions you have. Whether you are pregnant or may be pregnant. What are the risks? Generally, this is a safe test. However, problems may occur, including an allergic reaction to dye (contrast) that may be used during the test. What happens before the test? No specific preparation is needed. You may eat and drink normally. What happens during the test?  You will take off your clothes from the waist up and put on a hospital gown. Electrodes or electrocardiogram (ECG)patches may be placed on your chest. The electrodes or patches are then connected to a device that monitors your heart rate and rhythm. You will lie down on a table for an ultrasound exam. A gel will be applied to your chest to help sound waves pass through your skin. A handheld device, called a transducer, will be pressed against your chest and moved over your heart. The transducer produces sound waves that travel to your heart and bounce back (or "echo" back) to the transducer. These sound waves will be captured in real-time and changed into images of your heart that can be viewed on a video monitor. The images will be recorded on a computer and reviewed by your health care provider. You may be asked to change positions or hold your breath for a short time. This makes it easier to get different views or better views of your heart. In some cases, you may receive contrast through an IV in one of your veins. This can improve the quality of the pictures from your heart. The procedure may vary among health care providers and hospitals. What can I expect after the test? You may return to your normal, everyday life, including diet,  activities, and medicines, unless your health care provider tells you not to do that. Follow these instructions at home: It is up to you to get the results of your test. Ask your health care provider, or the department that is doing the test, when your results will be ready. Keep all follow-up visits. This is important. Summary An echocardiogram is a test that uses sound waves (ultrasound) to produce images of the heart. Images from an echocardiogram can provide important information about the size and shape of your heart, heart muscle function, heart valve function, and other possible heart problems. You do not need to do anything to prepare before this test. You may eat and drink normally. After the echocardiogram is completed, you may return to your normal, everyday life,  unless your health care provider tells you not to do that. This information is not intended to replace advice given to you by your health care provider. Make sure you discuss any questions you have with your health care provider. Document Revised: 11/11/2020 Document Reviewed: 10/22/2019 Elsevier Patient Education  2023 [employer].

## 2022-12-02 ENCOUNTER — Other Ambulatory Visit: Payer: Self-pay | Admitting: Family

## 2022-12-02 DIAGNOSIS — E559 Vitamin D deficiency, unspecified: Secondary | ICD-10-CM

## 2023-01-03 ENCOUNTER — Ambulatory Visit (HOSPITAL_BASED_OUTPATIENT_CLINIC_OR_DEPARTMENT_OTHER)
Admission: RE | Admit: 2023-01-03 | Discharge: 2023-01-03 | Disposition: A | Discharge status: Home / Self Care | Payer: Commercial Managed Care - HMO | Source: Ambulatory Visit | Attending: Cardiology | Admitting: Cardiology

## 2023-01-03 DIAGNOSIS — R011 Cardiac murmur, unspecified: Secondary | ICD-10-CM | POA: Insufficient documentation

## 2023-01-04 LAB — ECHOCARDIOGRAM COMPLETE
AR max vel: 1.79 cm2
AV Area VTI: 1.71 cm2
AV Area mean vel: 1.65 cm2
AV Mean grad: 4 mm[Hg]
AV Peak grad: 6.8 mm[Hg]
Ao pk vel: 1.3 m/s
Area-P 1/2: 4.63 cm2
Calc EF: 72.9 %
MV M vel: 1.86 m/s
MV Peak grad: 13.8 mm[Hg]
S' Lateral: 2.4 cm
Single Plane A2C EF: 74.5 %
Single Plane A4C EF: 71.1 %

## 2023-01-06 ENCOUNTER — Encounter (HOSPITAL_COMMUNITY): Payer: Self-pay

## 2023-01-08 ENCOUNTER — Other Ambulatory Visit: Payer: Self-pay | Admitting: Family Medicine

## 2023-01-08 DIAGNOSIS — R600 Localized edema: Secondary | ICD-10-CM

## 2023-01-12 ENCOUNTER — Ambulatory Visit (HOSPITAL_COMMUNITY): Payer: Commercial Managed Care - HMO | Attending: Cardiology

## 2023-01-12 DIAGNOSIS — R079 Chest pain, unspecified: Secondary | ICD-10-CM | POA: Insufficient documentation

## 2023-01-12 LAB — MYOCARDIAL PERFUSION IMAGING
LV dias vol: 61 mL (ref 46–106)
LV sys vol: 18 mL
Nuc Stress EF: 70 %
Peak HR: 85 {beats}/min
Rest HR: 53 {beats}/min
Rest Nuclear Isotope Dose: 9 mCi
SDS: 5
SRS: 0
SSS: 5
ST Depression (mm): 0 mm
Stress Nuclear Isotope Dose: 26.9 mCi
TID: 0.99

## 2023-01-12 MED ORDER — REGADENOSON 0.4 MG/5ML IV SOLN
0.4000 mg | Freq: Once | INTRAVENOUS | Status: AC
  Administered 2023-01-12: 0.4 mg via INTRAVENOUS

## 2023-01-12 MED ORDER — TECHNETIUM TC 99M TETROFOSMIN IV KIT
26.9000 | PACK | Freq: Once | INTRAVENOUS | Status: AC | PRN
  Administered 2023-01-12: 26.9 via INTRAVENOUS

## 2023-01-12 MED ORDER — TECHNETIUM TC 99M TETROFOSMIN IV KIT
9.0000 | PACK | Freq: Once | INTRAVENOUS | Status: AC | PRN
  Administered 2023-01-12: 9 via INTRAVENOUS

## 2023-01-31 ENCOUNTER — Encounter: Payer: Managed Care, Other (non HMO) | Admitting: Family Medicine

## 2023-02-06 ENCOUNTER — Encounter: Payer: Self-pay | Admitting: Family Medicine

## 2023-02-06 ENCOUNTER — Ambulatory Visit (INDEPENDENT_AMBULATORY_CARE_PROVIDER_SITE_OTHER): Payer: Commercial Managed Care - HMO | Admitting: Family Medicine

## 2023-02-06 VITALS — BP 110/70 | HR 68 | Temp 97.9°F | Resp 18 | Ht 64.0 in | Wt 200.4 lb

## 2023-02-06 DIAGNOSIS — Z0000 Encounter for general adult medical examination without abnormal findings: Secondary | ICD-10-CM | POA: Diagnosis not present

## 2023-02-06 DIAGNOSIS — E66811 Obesity, class 1: Secondary | ICD-10-CM | POA: Diagnosis not present

## 2023-02-06 DIAGNOSIS — I159 Secondary hypertension, unspecified: Secondary | ICD-10-CM

## 2023-02-06 DIAGNOSIS — E785 Hyperlipidemia, unspecified: Secondary | ICD-10-CM

## 2023-02-06 DIAGNOSIS — Z6831 Body mass index (BMI) 31.0-31.9, adult: Secondary | ICD-10-CM

## 2023-02-06 DIAGNOSIS — Z23 Encounter for immunization: Secondary | ICD-10-CM

## 2023-02-06 DIAGNOSIS — E538 Deficiency of other specified B group vitamins: Secondary | ICD-10-CM | POA: Diagnosis not present

## 2023-02-06 DIAGNOSIS — E559 Vitamin D deficiency, unspecified: Secondary | ICD-10-CM

## 2023-02-06 DIAGNOSIS — E6609 Other obesity due to excess calories: Secondary | ICD-10-CM | POA: Diagnosis not present

## 2023-02-06 DIAGNOSIS — I10 Essential (primary) hypertension: Secondary | ICD-10-CM | POA: Diagnosis not present

## 2023-02-06 DIAGNOSIS — R55 Syncope and collapse: Secondary | ICD-10-CM | POA: Insufficient documentation

## 2023-02-06 LAB — COMPREHENSIVE METABOLIC PANEL
ALT: 29 U/L (ref 0–35)
AST: 32 U/L (ref 0–37)
Albumin: 4.6 g/dL (ref 3.5–5.2)
Alkaline Phosphatase: 103 U/L (ref 39–117)
BUN: 18 mg/dL (ref 6–23)
CO2: 30 meq/L (ref 19–32)
Calcium: 10 mg/dL (ref 8.4–10.5)
Chloride: 98 meq/L (ref 96–112)
Creatinine, Ser: 1.01 mg/dL (ref 0.40–1.20)
GFR: 58.86 mL/min — ABNORMAL LOW (ref >60.00)
Glucose, Bld: 93 mg/dL (ref 70–99)
Potassium: 4.2 meq/L (ref 3.5–5.1)
Sodium: 139 meq/L (ref 135–145)
Total Bilirubin: 1.1 mg/dL (ref 0.2–1.2)
Total Protein: 7.1 g/dL (ref 6.0–8.3)

## 2023-02-06 LAB — CBC WITH DIFFERENTIAL/PLATELET
Basophils Absolute: 0 10*3/uL (ref 0.0–0.1)
Basophils Relative: 0.5 % (ref 0.0–3.0)
Eosinophils Absolute: 0.1 10*3/uL (ref 0.0–0.7)
Eosinophils Relative: 1.6 % (ref 0.0–5.0)
HCT: 43.9 % (ref 36.0–46.0)
Hemoglobin: 14.8 g/dL (ref 12.0–15.0)
Lymphocytes Relative: 31.3 % (ref 12.0–46.0)
Lymphs Abs: 1.6 10*3/uL (ref 0.7–4.0)
MCHC: 33.8 g/dL (ref 30.0–36.0)
MCV: 91.4 fL (ref 78.0–100.0)
Monocytes Absolute: 0.4 10*3/uL (ref 0.1–1.0)
Monocytes Relative: 7.9 % (ref 3.0–12.0)
Neutro Abs: 3 10*3/uL (ref 1.4–7.7)
Neutrophils Relative %: 58.7 % (ref 43.0–77.0)
Platelets: 235 10*3/uL (ref 150.0–400.0)
RBC: 4.8 Mil/uL (ref 3.87–5.11)
RDW: 13.8 % (ref 11.5–15.5)
WBC: 5.1 10*3/uL (ref 4.0–10.5)

## 2023-02-06 LAB — HEMOGLOBIN A1C: Hgb A1c MFr Bld: 5.4 % (ref 4.6–6.5)

## 2023-02-06 LAB — LIPID PANEL
Cholesterol: 142 mg/dL (ref 0–200)
HDL: 37.4 mg/dL — ABNORMAL LOW (ref >39.00)
LDL Cholesterol: 71 mg/dL (ref 0–99)
NonHDL: 104.4
Total CHOL/HDL Ratio: 4
Triglycerides: 166 mg/dL — ABNORMAL HIGH (ref 0.0–149.0)
VLDL: 33.2 mg/dL (ref 0.0–40.0)

## 2023-02-06 LAB — TSH: TSH: 1.3 u[IU]/mL (ref 0.35–5.50)

## 2023-02-06 LAB — VITAMIN B12: Vitamin B-12: 1405 pg/mL — ABNORMAL HIGH (ref 211–911)

## 2023-02-06 LAB — VITAMIN D 25 HYDROXY (VIT D DEFICIENCY, FRACTURES): VITD: 32.72 ng/mL (ref 30.00–100.00)

## 2023-02-06 NOTE — Assessment & Plan Note (Signed)
Well controlled, no changes to meds. Encouraged heart healthy diet such as the DASH diet and exercise as tolerated.  °

## 2023-02-06 NOTE — Progress Notes (Signed)
Established Patient Office Visit  Subjective   Patient ID: Tami Monroe, female    DOB: 06/06/1960  Age: 62 y.o. MRN: [id number]  Chief Complaint  Patient presents with   Annual Exam    Pt states fasting     HPI Discussed the use of AI scribe software for clinical note transcription with the patient, who gave verbal consent to proceed.  History of Present Illness   The patient presents with a history of cardiac concerns, which led to a series of heart tests including an echocardiogram and a stress test. All results were reported as normal. The patient also reports a suspected case of COVID-19 in September, despite testing negative at the time. She experienced symptoms consistent with the virus and was in contact with a confirmed case.  The patient has a family history of type 2 diabetes, with her mother, sister, and older brother all diagnosed. She also experienced an episode of near syncope in August while driving, characterized by a rising pressure in her throat and vision blacking out. This episode has not recurred.  The patient has started a new medication regimen, which she reports has led to a weight loss of approximately ten pounds in two weeks. She has noticed a decrease in discomfort in her shoulder, which she previously attributed to her bra strap. She believes this discomfort may have contributed to her near syncope episode.  The patient has also been referred for a breast reduction, but has decided to postpone this until she has achieved her desired weight loss. She has a history of normal Pap smears and is due for a mammogram. She also reports regular visits to the eye doctor and dentist.  The patient's colonoscopy in August revealed precancerous polyps, and she is due for a follow-up in 2029. She is also due for a flu shot and is considering a COVID-19 vaccination.      Patient Active Problem List   Diagnosis Date Noted   Near syncope 02/06/2023   Carotid atherosclerosis  12/01/2022   Chest pain of uncertain etiology 12/01/2022   Cardiac murmur 12/01/2022   Obesity (BMI 35.0-39.9 without comorbidity) 12/01/2022   Allergy    Arthritis    Constipation    Hypertension    Joint pain    Acute maxillary sinusitis, unspecified 04/11/2022   Preventative health care 01/17/2022   Hyperlipidemia    Vitamin D deficiency 05/15/2018   Essential hypertension 04/04/2018   SI (sacroiliac) joint dysfunction 01/04/2018   Lower extremity edema 01/31/2016   Class 1 obesity due to excess calories with serious comorbidity and body mass index (BMI) of 31.0 to 31.9 in adult 01/31/2016   Past Medical History:  Diagnosis Date   Acute maxillary sinusitis, unspecified 04/11/2022   Allergy    Arthritis    HIP,KNEE   Class 1 obesity due to excess calories with serious comorbidity and body mass index (BMI) of 31.0 to 31.9 in adult 01/31/2016   Constipation    Essential hypertension 04/04/2018   Hyperlipidemia    Hypertension    Joint pain    Lower extremity edema 01/31/2016   Preventative health care 01/17/2022   SI (sacroiliac) joint dysfunction 01/04/2018   Vitamin D deficiency 05/15/2018   Past Surgical History:  Procedure Laterality Date   CHOLECYSTECTOMY  03/14/2009   COLONOSCOPY     Social History   Tobacco Use   Smoking status: Never   Smokeless tobacco: Never  Vaping Use   Vaping status: Never Used  Substance Use  Topics   Alcohol use: Yes    Comment: OCC   Drug use: No   Social History   Socioeconomic History   Marital status: Single    Spouse name: Not on file   Number of children: Not on file   Years of education: Not on file   Highest education level: Bachelor's degree (e.g., BA, AB, BS)  Occupational History   Occupation: Production designer, theatre/television/film in customers    Employer: [employer]    Comment: retired 2021  Tobacco Use   Smoking status: Never   Smokeless tobacco: Never  Vaping Use   Vaping status: Never Used  Substance and Sexual Activity    Alcohol use: Yes    Comment: OCC   Drug use: No   Sexual activity: Not Currently    Partners: Male  Other Topics Concern   Not on file  Social History Narrative   -Exercise--- walking 2 x a week   Social Determinants of Health   Financial Resource Strain: Low Risk  (10/21/2022)   Overall Financial Resource Strain (CARDIA)    Difficulty of Paying Living Expenses: Not very hard  Food Insecurity: No Food Insecurity (10/21/2022)   Hunger Vital Sign    Worried About Running Out of Food in the Last Year: Never true    Ran Out of Food in the Last Year: Never true  Transportation Needs: No Transportation Needs (10/21/2022)   PRAPARE - Administrator, Civil Service (Medical): No    Lack of Transportation (Non-Medical): No  Physical Activity: Insufficiently Active (10/21/2022)   Exercise Vital Sign    Days of Exercise per Week: 3 days    Minutes of Exercise per Session: 40 min  Stress: No Stress Concern Present (10/21/2022)   [employer] of Occupational Health - Occupational Stress Questionnaire    Feeling of Stress : Only a little  Social Connections: Moderately Integrated (10/21/2022)   Social Connection and Isolation Panel [NHANES]    Frequency of Communication with Friends and Family: More than three times a week    Frequency of Social Gatherings with Friends and Family: Twice a week    Attends Religious Services: More than 4 times per year    Active Member of [employer] or Organizations: Yes    Attends Banker Meetings: More than 4 times per year    Marital Status: Never married  Intimate Partner Violence: Unknown (06/17/2021)   Received from [employer], [facility]   HITS    Physically Hurt: Not on file    Insult or Talk Down To: Not on file    Threaten Physical Harm: Not on file    Scream or Curse: Not on file   Family Status  Relation Name Status   Mother  Deceased at age 94   Father  Deceased at age 8   Sister  Alive   Brother  Deceased   Brother   Deceased   Other  (Not Specified)   Neg Hx  (Not Specified)  No partnership data on file   Family History  Problem Relation Age of Onset   Arthritis Mother    Hyperlipidemia Mother    Heart disease Mother    Hypertension Mother    Diabetes Mother    Bladder Cancer Mother 51   Depression Mother    Obesity Mother    Lung cancer Father        smoker   Alcohol abuse Father    Obesity Father  Diabetes Sister 19   Vision loss Sister        optic nerve stroke   Stroke Sister    Diabetes Mellitus II Brother 60   Heart disease Brother    Crohn's disease Brother 44   Cancer Other        Ureter cancer   Colon cancer Neg Hx    Colon polyps Neg Hx    Esophageal cancer Neg Hx    Rectal cancer Neg Hx    Stomach cancer Neg Hx    Ulcerative colitis Neg Hx    No Known Allergies    Review of Systems  Constitutional:  Negative for chills, fever and malaise/fatigue.  HENT:  Negative for congestion and hearing loss.   Eyes:  Negative for blurred vision and discharge.  Respiratory:  Negative for cough, sputum production and shortness of breath.   Cardiovascular:  Negative for chest pain, palpitations and leg swelling.  Gastrointestinal:  Negative for abdominal pain, blood in stool, constipation, diarrhea, heartburn, nausea and vomiting.  Genitourinary:  Negative for dysuria, frequency, hematuria and urgency.  Musculoskeletal:  Negative for back pain, falls and myalgias.  Skin:  Negative for rash.  Neurological:  Negative for dizziness, sensory change, loss of consciousness, weakness and headaches.  Endo/Heme/Allergies:  Negative for environmental allergies. Does not bruise/bleed easily.  Psychiatric/Behavioral:  Negative for depression and suicidal ideas. The patient is not nervous/anxious and does not have insomnia.       Objective:     BP 110/70 (BP Location: Right Arm, Patient Position: Sitting, Cuff Size: Large)   Pulse 68   Temp 97.9 F (36.6 C) (Oral)   Resp 18   Ht 5'  4" (1.626 m)   Wt 200 lb 6.4 oz (90.9 kg)   SpO2 99%   BMI 34.40 kg/m  BP Readings from Last 3 Encounters:  02/06/23 110/70  12/01/22 112/70  10/27/22 111/69   Wt Readings from Last 3 Encounters:  02/06/23 200 lb 6.4 oz (90.9 kg)  12/01/22 204 lb 1.3 oz (92.6 kg)  10/27/22 200 lb (90.7 kg)   SpO2 Readings from Last 3 Encounters:  02/06/23 99%  12/01/22 97%  10/27/22 96%      Physical Exam   No results found for any visits on 02/06/23.  Last CBC Lab Results  Component Value Date   WBC 7.0 10/17/2022   HGB 14.3 10/17/2022   HCT 42.4 10/17/2022   MCV 89.6 10/17/2022   MCH 30.2 10/17/2022   RDW 12.9 10/17/2022   PLT 254 10/17/2022   Last metabolic panel Lab Results  Component Value Date   GLUCOSE 149 (H) 10/17/2022   NA 138 10/17/2022   K 3.4 (L) 10/17/2022   CL 97 (L) 10/17/2022   CO2 29 10/17/2022   BUN 18 10/17/2022   CREATININE 0.94 10/17/2022   GFRNONAA >60 10/17/2022   CALCIUM 9.3 10/17/2022   PROT 6.6 07/18/2022   ALBUMIN 4.2 07/18/2022   LABGLOB 2.4 04/02/2018   AGRATIO 1.9 04/02/2018   BILITOT 0.7 07/18/2022   ALKPHOS 101 07/18/2022   AST 28 07/18/2022   ALT 23 07/18/2022   ANIONGAP 12 10/17/2022   Last lipids Lab Results  Component Value Date   CHOL 165 07/18/2022   HDL 46.40 07/18/2022   LDLCALC 88 07/18/2022   LDLDIRECT 97.0 01/17/2022   TRIG 156.0 (H) 07/18/2022   CHOLHDL 4 07/18/2022   Last hemoglobin A1c Lab Results  Component Value Date   HGBA1C 5.5 04/02/2018   Last thyroid  functions Lab Results  Component Value Date   TSH 1.55 01/17/2022   Last vitamin D Lab Results  Component Value Date   VD25OH 38.58 07/18/2022   Last vitamin B12 and Folate Lab Results  Component Value Date   VITAMINB12 1,469 (H) 07/18/2022      The 10-year ASCVD risk score (Arnett DK, et al., 2019) is: 4.8%    Assessment & Plan:   Problem List Items Addressed This Visit       Unprioritized   Vitamin D deficiency   Relevant Orders    VITAMIN D 25 Hydroxy (Vit-D Deficiency, Fractures)   Essential hypertension   Relevant Orders   Lipid panel   TSH   Class 1 obesity due to excess calories with serious comorbidity and body mass index (BMI) of 31.0 to 31.9 in adult   Relevant Medications   tirzepatide (MOUNJARO) 2.5 MG/0.5ML Pen   Other Relevant Orders   CBC with Differential/Platelet   Comprehensive metabolic panel   Lipid panel   TSH   Hemoglobin A1c   Vitamin B12   VITAMIN D 25 Hydroxy (Vit-D Deficiency, Fractures)   Insulin, random   Preventative health care - Primary    Ghm utd Check labs see AVS Health Maintenance  Topic Date Due   COVID-19 Vaccine (4 - 2023-24 season) 11/13/2022   MAMMOGRAM  01/19/2023   HIV Screening  01/05/2024 (Originally 08/28/1975)   DTaP/Tdap/Td (3 - Td or Tdap) 10/08/2025   Colonoscopy  10/27/2027   INFLUENZA VACCINE  Completed   Hepatitis C Screening  Completed   Zoster Vaccines- Shingrix  Completed   HPV VACCINES  Aged Out         Near syncope    Cardiac w/u normal No further episodes since weight loss       Hypertension    Well controlled, no changes to meds. Encouraged heart healthy diet such as the DASH diet and exercise as tolerated.        Hyperlipidemia    Encourage heart healthy diet such as MIND or DASH diet, increase exercise, avoid trans fats, simple carbohydrates and processed foods, consider a krill or fish or flaxseed oil cap daily.        Relevant Orders   CBC with Differential/Platelet   Comprehensive metabolic panel   Lipid panel   Other Visit Diagnoses     Need for influenza vaccination       Relevant Orders   Flu vaccine trivalent PF, 6mos and older(Flulaval,Afluria,Fluarix,Fluzone) (Completed)   Vitamin B12 deficiency       Relevant Orders   Vitamin B12     Assessment and Plan    Weight Loss   She lost approximately 10 pounds in the last two weeks using a research peptide from [employer], vetted by a PA friend and reported relief  from shoulder discomfort after weight loss. We will monitor her weight and overall health, watch for adverse effects from the medication, and discuss the use of research peptides with a healthcare provider.  Hypertension   Her current blood pressure is 110/70 mmHg while on hydrochlorothiazide. We advised her to monitor her blood pressure as weight loss continues and consider stopping hydrochlorothiazide if her blood pressure drops significantly. She should report any significant changes in blood pressure.  Cardiac Evaluation   Her recent echocardiogram and stress test were normal with no further episodes of near syncope since August. She reports a sensation at the bottom of her throat but no further concerning symptoms, and an emergency  room scan ruled out stroke and carotid artery issues. We will monitor for new or recurring symptoms and no immediate follow-up with cardiology is required.  Breast Reduction Consideration   She previously considered breast reduction surgery but decided to postpone until after weight loss to reassess breast size. She experienced relief from shoulder discomfort after weight loss. We will reevaluate the need for breast reduction surgery after further weight loss.  General Health Maintenance   She is due for a mammogram and eligible for Medicare in June. Her last bone density scan was last year, with the next one due next year. A colonoscopy in August showed typical precancerous polyps, with the next due in 2029. She makes regular visits to the eye doctor and dentist. We will schedule a mammogram, continue regular visits to the eye doctor and dentist, repeat the bone density scan next year, and repeat the colonoscopy in 2029.  Vaccinations   She is due for a flu shot and has not had a COVID-19 vaccine in years. She had a possible COVID-19 infection in September but tested negative. We advised waiting three months from the suspected infection before considering a COVID-19  vaccine. We will administer the flu shot and discuss the COVID-19 vaccine after three months from the suspected infection.  Follow-up   Blood work will be done today and again in six months. We will monitor her blood pressure and report any significant changes.        No follow-ups on file.    Donato Schultz, DO

## 2023-02-06 NOTE — Assessment & Plan Note (Signed)
Encourage heart healthy diet such as MIND or DASH diet, increase exercise, avoid trans fats, simple carbohydrates and processed foods, consider a krill or fish or flaxseed oil cap daily.  °

## 2023-02-06 NOTE — Assessment & Plan Note (Signed)
Ghm utd Check labs see AVS Health Maintenance  Topic Date Due   COVID-19 Vaccine (4 - 2023-24 season) 11/13/2022   MAMMOGRAM  01/19/2023   HIV Screening  01/05/2024 (Originally 08/28/1975)   DTaP/Tdap/Td (3 - Td or Tdap) 10/08/2025   Colonoscopy  10/27/2027   INFLUENZA VACCINE  Completed   Hepatitis C Screening  Completed   Zoster Vaccines- Shingrix  Completed   HPV VACCINES  Aged Out

## 2023-02-06 NOTE — Assessment & Plan Note (Signed)
Cardiac w/u normal No further episodes since weight loss

## 2023-02-07 ENCOUNTER — Other Ambulatory Visit (HOSPITAL_BASED_OUTPATIENT_CLINIC_OR_DEPARTMENT_OTHER): Payer: Self-pay | Admitting: Family Medicine

## 2023-02-07 DIAGNOSIS — Z1231 Encounter for screening mammogram for malignant neoplasm of breast: Secondary | ICD-10-CM

## 2023-02-07 LAB — INSULIN, RANDOM: Insulin: 20.8 u[IU]/mL — ABNORMAL HIGH

## 2023-02-13 ENCOUNTER — Encounter (HOSPITAL_BASED_OUTPATIENT_CLINIC_OR_DEPARTMENT_OTHER): Payer: Self-pay

## 2023-02-13 ENCOUNTER — Ambulatory Visit (HOSPITAL_BASED_OUTPATIENT_CLINIC_OR_DEPARTMENT_OTHER)
Admission: RE | Admit: 2023-02-13 | Discharge: 2023-02-13 | Disposition: A | Discharge status: Home / Self Care | Payer: Commercial Managed Care - HMO | Source: Ambulatory Visit | Attending: Family Medicine | Admitting: Family Medicine

## 2023-02-13 DIAGNOSIS — Z1231 Encounter for screening mammogram for malignant neoplasm of breast: Secondary | ICD-10-CM | POA: Insufficient documentation

## 2023-04-07 ENCOUNTER — Other Ambulatory Visit: Payer: Self-pay | Admitting: Family Medicine

## 2023-04-07 DIAGNOSIS — E785 Hyperlipidemia, unspecified: Secondary | ICD-10-CM

## 2023-04-07 NOTE — Telephone Encounter (Signed)
Last Fill: 07/18/22  Last OV: 02/06/23 Next OV: 08/15/23  Routing to provider for review/authorization.

## 2023-04-07 NOTE — Telephone Encounter (Signed)
Copied from CRM [id number]. Topic: Clinical - Medication Refill >> Apr 07, 2023  4:37 PM Fuller Mandril wrote: Most Recent Primary Care Visit:  Provider: Seabron Spates R  Department: [facility]  Visit Type: PHYSICAL  Date: 02/06/2023  Medication: rosuvastatin (CRESTOR) 20 MG tablet  Has the patient contacted their pharmacy? Yes (Agent: If no, request that the patient contact the pharmacy for the refill. If patient does not wish to contact the pharmacy document the reason why and proceed with request.) (Agent: If yes, when and what did the pharmacy advise?) received a text from pharmacy that provider did not refill Rx.  Is this the correct pharmacy for this prescription? Yes If no, delete pharmacy and type the correct one.  This is the patient's preferred pharmacy:  [id number] - [place], [place] - [address] [place]  [address] Phone: [phone] Fax: [phone]   Has the prescription been filled recently? No  Is the patient out of the medication? No  Has the patient been seen for an appointment in the last year OR does the patient have an upcoming appointment? Yes  Can we respond through MyChart? Yes  Agent: Please be advised that Rx refills may take up to 3 business days. We ask that you follow-up with your pharmacy.

## 2023-04-10 MED ORDER — ROSUVASTATIN CALCIUM 20 MG PO TABS: ORAL_TABLET | ORAL | 1 refills | Status: DC

## 2023-08-08 ENCOUNTER — Telehealth: Payer: Self-pay | Admitting: Family Medicine

## 2023-08-08 DIAGNOSIS — R600 Localized edema: Secondary | ICD-10-CM

## 2023-08-08 NOTE — Telephone Encounter (Unsigned)
 Copied from CRM [id number]. Topic: Clinical - Medication Refill >> Aug 08, 2023  1:55 PM Chuck Crater wrote: Medication: hydrochlorothiazide  (HYDRODIURIL ) 25 MG tablet  Has the patient contacted their pharmacy? Yes (Agent: If no, request that the patient contact the pharmacy for the refill. If patient does not wish to contact the pharmacy document the reason why and proceed with request.) (Agent: If yes, when and what did the pharmacy advise?) no response / waiting for approval  This is the patient's preferred pharmacy:  [id number] - [place], [place] - [address] [place] [address] Phone: [phone] Fax: [phone]  Is this the correct pharmacy for this prescription? Yes If no, delete pharmacy and type the correct one.   Has the prescription been filled recently? No  Is the patient out of the medication? Yes 1 week left   Has the patient been seen for an appointment in the last year OR does the patient have an upcoming appointment? Yes  Can we respond through MyChart? Yes  Agent: Please be advised that Rx refills may take up to 3 business days. We ask that you follow-up with your pharmacy.

## 2023-08-09 MED ORDER — HYDROCHLOROTHIAZIDE 25 MG PO TABS: 25.0000 mg | ORAL_TABLET | Freq: Every day | ORAL | 0 refills | Status: DC | PRN

## 2023-08-15 ENCOUNTER — Ambulatory Visit (INDEPENDENT_AMBULATORY_CARE_PROVIDER_SITE_OTHER): Payer: Commercial Managed Care - HMO | Admitting: Family Medicine

## 2023-08-15 ENCOUNTER — Encounter: Payer: Self-pay | Admitting: Family Medicine

## 2023-08-15 VITALS — BP 100/70 | HR 70 | Temp 97.7°F | Resp 16 | Ht 64.0 in | Wt 174.4 lb

## 2023-08-15 DIAGNOSIS — I10 Essential (primary) hypertension: Secondary | ICD-10-CM | POA: Diagnosis not present

## 2023-08-15 DIAGNOSIS — E538 Deficiency of other specified B group vitamins: Secondary | ICD-10-CM

## 2023-08-15 DIAGNOSIS — E785 Hyperlipidemia, unspecified: Secondary | ICD-10-CM | POA: Diagnosis not present

## 2023-08-15 DIAGNOSIS — Z23 Encounter for immunization: Secondary | ICD-10-CM | POA: Diagnosis not present

## 2023-08-15 DIAGNOSIS — R600 Localized edema: Secondary | ICD-10-CM

## 2023-08-15 DIAGNOSIS — Z6831 Body mass index (BMI) 31.0-31.9, adult: Secondary | ICD-10-CM

## 2023-08-15 DIAGNOSIS — E559 Vitamin D deficiency, unspecified: Secondary | ICD-10-CM | POA: Diagnosis not present

## 2023-08-15 DIAGNOSIS — E6609 Other obesity due to excess calories: Secondary | ICD-10-CM

## 2023-08-15 DIAGNOSIS — R252 Cramp and spasm: Secondary | ICD-10-CM | POA: Insufficient documentation

## 2023-08-15 DIAGNOSIS — E66811 Obesity, class 1: Secondary | ICD-10-CM

## 2023-08-15 LAB — COMPREHENSIVE METABOLIC PANEL WITH GFR
ALT: 11 U/L (ref 0–35)
AST: 18 U/L (ref 0–37)
Albumin: 4.5 g/dL (ref 3.5–5.2)
Alkaline Phosphatase: 104 U/L (ref 39–117)
BUN: 19 mg/dL (ref 6–23)
CO2: 32 meq/L (ref 19–32)
Calcium: 9.7 mg/dL (ref 8.4–10.5)
Chloride: 99 meq/L (ref 96–112)
Creatinine, Ser: 0.9 mg/dL (ref 0.40–1.20)
GFR: 67.35 mL/min (ref >60.00)
Glucose, Bld: 86 mg/dL (ref 70–99)
Potassium: 3.7 meq/L (ref 3.5–5.1)
Sodium: 140 meq/L (ref 135–145)
Total Bilirubin: 0.8 mg/dL (ref 0.2–1.2)
Total Protein: 6.9 g/dL (ref 6.0–8.3)

## 2023-08-15 LAB — CBC WITH DIFFERENTIAL/PLATELET
Basophils Absolute: 0 10*3/uL (ref 0.0–0.1)
Basophils Relative: 0.4 % (ref 0.0–3.0)
Eosinophils Absolute: 0.2 10*3/uL (ref 0.0–0.7)
Eosinophils Relative: 2.7 % (ref 0.0–5.0)
HCT: 41.2 % (ref 36.0–46.0)
Hemoglobin: 14 g/dL (ref 12.0–15.0)
Lymphocytes Relative: 25.9 % (ref 12.0–46.0)
Lymphs Abs: 1.6 10*3/uL (ref 0.7–4.0)
MCHC: 34.1 g/dL (ref 30.0–36.0)
MCV: 88.8 fl (ref 78.0–100.0)
Monocytes Absolute: 0.4 10*3/uL (ref 0.1–1.0)
Monocytes Relative: 6.1 % (ref 3.0–12.0)
Neutro Abs: 4 10*3/uL (ref 1.4–7.7)
Neutrophils Relative %: 64.9 % (ref 43.0–77.0)
Platelets: 240 10*3/uL (ref 150.0–400.0)
RBC: 4.64 Mil/uL (ref 3.87–5.11)
RDW: 13.7 % (ref 11.5–15.5)
WBC: 6.1 10*3/uL (ref 4.0–10.5)

## 2023-08-15 LAB — LIPID PANEL
Cholesterol: 161 mg/dL (ref 0–200)
HDL: 41.9 mg/dL (ref >39.00)
LDL Cholesterol: 78 mg/dL (ref 0–99)
NonHDL: 119.04
Total CHOL/HDL Ratio: 4
Triglycerides: 205 mg/dL — ABNORMAL HIGH (ref 0.0–149.0)
VLDL: 41 mg/dL — ABNORMAL HIGH (ref 0.0–40.0)

## 2023-08-15 LAB — VITAMIN D 25 HYDROXY (VIT D DEFICIENCY, FRACTURES): VITD: 27.3 ng/mL — ABNORMAL LOW (ref 30.00–100.00)

## 2023-08-15 LAB — TSH: TSH: 1.66 u[IU]/mL (ref 0.35–5.50)

## 2023-08-15 LAB — VITAMIN B12: Vitamin B-12: 581 pg/mL (ref 211–911)

## 2023-08-15 MED ORDER — HYDROCHLOROTHIAZIDE 25 MG PO TABS: 25.0000 mg | ORAL_TABLET | Freq: Every day | ORAL | 3 refills | Status: AC | PRN

## 2023-08-15 MED ORDER — VITAMIN D (ERGOCALCIFEROL) 1.25 MG (50000 UNIT) PO CAPS: 50000.0000 [IU] | ORAL_CAPSULE | ORAL | 3 refills | Status: AC

## 2023-08-15 NOTE — Assessment & Plan Note (Signed)
 Encourage heart healthy diet such as MIND or DASH diet, increase exercise, avoid trans fats, simple carbohydrates and processed foods, consider a krill or fish or flaxseed oil cap daily.

## 2023-08-15 NOTE — Progress Notes (Signed)
 Established Patient Office Visit  Subjective   Patient ID: Tami Monroe, female    DOB: [birth year]  Age: 63 y.o. MRN: [id number]  Chief Complaint  Patient presents with   Hyperlipidemia   Hypertension   Follow-up    HPI Discussed the use of AI scribe software for clinical note transcription with the patient, who gave verbal consent to proceed.  History of Present Illness Tami Monroe is a 63 year old female who presents for a follow-up visit and medication refills.  She requires refills for her vitamin D  and blood pressure medication. She has experienced issues with her pharmacy, [facility], regarding the refill of her hydrochlorothiazide  prescription, which she has attempted to resolve by contacting them twice. She also mentions similar issues with her statin prescription in the past.  She is currently taking Mounjaro at a dose of 5 mg, which has helped her lose 30 pounds. Her goal is to lose a total of 55 pounds and reach a weight of 150 pounds.  Her blood pressure at home was recorded as 100/70 mmHg, and she feels fine with no dizziness or lightheadedness. She has a blood pressure cuff at home but admits to rarely using it.  She recently transitioned to Medicare. She inquired about her tetanus vaccination status, which was last administered in 2017, and has not yet received the pneumonia vaccine.  No swelling in the ankles.   Patient Active Problem List   Diagnosis Date Noted   Leg cramps 08/15/2023   Near syncope 02/06/2023   Carotid atherosclerosis 12/01/2022   Chest pain of uncertain etiology 12/01/2022   Cardiac murmur 12/01/2022   Obesity (BMI 35.0-39.9 without comorbidity) 12/01/2022   Allergy    Arthritis    Constipation    Hypertension    Joint pain    Acute maxillary sinusitis, unspecified 04/11/2022   Preventative health care 01/17/2022   Hyperlipidemia    Vitamin D  deficiency 05/15/2018   Essential hypertension 04/04/2018   SI (sacroiliac) joint  dysfunction 01/04/2018   Lower extremity edema 01/31/2016   Class 1 obesity due to excess calories with serious comorbidity and body mass index (BMI) of 31.0 to 31.9 in adult 01/31/2016   Past Medical History:  Diagnosis Date   Acute maxillary sinusitis, unspecified 04/11/2022   Allergy    Arthritis    HIP,KNEE   Class 1 obesity due to excess calories with serious comorbidity and body mass index (BMI) of 31.0 to 31.9 in adult 01/31/2016   Constipation    Essential hypertension 04/04/2018   Hyperlipidemia    Hypertension    Joint pain    Lower extremity edema 01/31/2016   Preventative health care 01/17/2022   SI (sacroiliac) joint dysfunction 01/04/2018   Vitamin D  deficiency 05/15/2018   Past Surgical History:  Procedure Laterality Date   CHOLECYSTECTOMY  03/14/2009   COLONOSCOPY     Social History   Tobacco Use   Smoking status: Never   Smokeless tobacco: Never  Vaping Use   Vaping status: Never Used  Substance Use Topics   Alcohol use: Yes    Comment: OCC   Drug use: No   Social History   Socioeconomic History   Marital status: Single    Spouse name: Not on file   Number of children: Not on file   Years of education: Not on file   Highest education level: Bachelor's degree (e.g., BA, AB, BS)  Occupational History   Occupation: Production designer, theatre/television/film in customers    Employer: [employer]  Comment: retired 2021  Tobacco Use   Smoking status: Never   Smokeless tobacco: Never  Vaping Use   Vaping status: Never Used  Substance and Sexual Activity   Alcohol use: Yes    Comment: OCC   Drug use: No   Sexual activity: Not Currently    Partners: Male  Other Topics Concern   Not on file  Social History Narrative   -Exercise--- walking 2 x a week   Social Drivers of Health   Financial Resource Strain: Low Risk  (10/21/2022)   Overall Financial Resource Strain (CARDIA)    Difficulty of Paying Living Expenses: Not very hard  Food Insecurity: No Food Insecurity  (10/21/2022)   Hunger Vital Sign    Worried About Running Out of Food in the Last Year: Never true    Ran Out of Food in the Last Year: Never true  Transportation Needs: No Transportation Needs (10/21/2022)   PRAPARE - Administrator, Civil Service (Medical): No    Lack of Transportation (Non-Medical): No  Physical Activity: Insufficiently Active (10/21/2022)   Exercise Vital Sign    Days of Exercise per Week: 3 days    Minutes of Exercise per Session: 40 min  Stress: No Stress Concern Present (10/21/2022)   [employer] of Occupational Health - Occupational Stress Questionnaire    Feeling of Stress : Only a little  Social Connections: Moderately Integrated (10/21/2022)   Social Connection and Isolation Panel [NHANES]    Frequency of Communication with Friends and Family: More than three times a week    Frequency of Social Gatherings with Friends and Family: Twice a week    Attends Religious Services: More than 4 times per year    Active Member of [employer] or Organizations: Yes    Attends Engineer, structural: More than 4 times per year    Marital Status: Never married  Intimate Partner Violence: Unknown (06/17/2021)   Received from [employer], [facility]   HITS    Physically Hurt: Not on file    Insult or Talk Down To: Not on file    Threaten Physical Harm: Not on file    Scream or Curse: Not on file   Family Status  Relation Name Status   Mother  Deceased at age 49   Father  Deceased at age 106   Sister  Alive   Brother  Deceased   Brother  Deceased   Other  (Not Specified)   Neg Hx  (Not Specified)  No partnership data on file   Family History  Problem Relation Age of Onset   Arthritis Mother    Hyperlipidemia Mother    Heart disease Mother    Hypertension Mother    Diabetes Mother    Bladder Cancer Mother 5   Depression Mother    Obesity Mother    Lung cancer Father        smoker   Alcohol abuse Father    Obesity Father    Diabetes Sister 59    Vision loss Sister        optic nerve stroke   Stroke Sister    Diabetes Mellitus II Brother 60   Heart disease Brother    Crohn's disease Brother 24   Cancer Other        Ureter cancer   Colon cancer Neg Hx    Colon polyps Neg Hx    Esophageal cancer Neg Hx    Rectal cancer Neg Hx  Stomach cancer Neg Hx    Ulcerative colitis Neg Hx    No Known Allergies    Review of Systems  Constitutional:  Negative for chills, fever and malaise/fatigue.  HENT:  Negative for congestion and hearing loss.   Eyes:  Negative for blurred vision and discharge.  Respiratory:  Negative for cough, sputum production and shortness of breath.   Cardiovascular:  Negative for chest pain, palpitations and leg swelling.  Gastrointestinal:  Negative for abdominal pain, blood in stool, constipation, diarrhea, heartburn, nausea and vomiting.  Genitourinary:  Negative for dysuria, frequency, hematuria and urgency.  Musculoskeletal:  Negative for back pain, falls and myalgias.  Skin:  Negative for rash.  Neurological:  Negative for dizziness, sensory change, loss of consciousness, weakness and headaches.  Endo/Heme/Allergies:  Negative for environmental allergies. Does not bruise/bleed easily.  Psychiatric/Behavioral:  Negative for depression and suicidal ideas. The patient is not nervous/anxious and does not have insomnia.       Objective:     BP 100/70 (BP Location: Left Arm, Patient Position: Sitting, Cuff Size: Normal)   Pulse 70   Temp 97.7 F (36.5 C) (Oral)   Resp 16   Ht 5\' 4"  (1.626 m)   Wt 174 lb 6.4 oz (79.1 kg)   SpO2 96%   BMI 29.94 kg/m  BP Readings from Last 3 Encounters:  08/15/23 100/70  02/06/23 110/70  12/01/22 112/70   Wt Readings from Last 3 Encounters:  08/15/23 174 lb 6.4 oz (79.1 kg)  02/06/23 200 lb 6.4 oz (90.9 kg)  12/01/22 204 lb 1.3 oz (92.6 kg)   SpO2 Readings from Last 3 Encounters:  08/15/23 96%  02/06/23 99%  12/01/22 97%      Physical Exam Vitals  and nursing note reviewed.  Constitutional:      General: She is not in acute distress.    Appearance: Normal appearance. She is well-developed.  HENT:     Head: Normocephalic and atraumatic.  Eyes:     General: No scleral icterus.       Right eye: No discharge.        Left eye: No discharge.  Cardiovascular:     Rate and Rhythm: Normal rate and regular rhythm.     Heart sounds: No murmur heard. Pulmonary:     Effort: Pulmonary effort is normal. No respiratory distress.     Breath sounds: Normal breath sounds.  Musculoskeletal:        General: Normal range of motion.     Cervical back: Normal range of motion and neck supple.     Right lower leg: No edema.     Left lower leg: No edema.  Skin:    General: Skin is warm and dry.  Neurological:     Mental Status: She is alert and oriented to person, place, and time.  Psychiatric:        Mood and Affect: Mood normal.        Behavior: Behavior normal.        Thought Content: Thought content normal.        Judgment: Judgment normal.      No results found for any visits on 08/15/23.  Last CBC Lab Results  Component Value Date   WBC 5.1 02/06/2023   HGB 14.8 02/06/2023   HCT 43.9 02/06/2023   MCV 91.4 02/06/2023   MCH 30.2 10/17/2022   RDW 13.8 02/06/2023   PLT 235.0 02/06/2023   Last metabolic panel Lab Results  Component Value Date  GLUCOSE 93 02/06/2023   NA 139 02/06/2023   K 4.2 02/06/2023   CL 98 02/06/2023   CO2 30 02/06/2023   BUN 18 02/06/2023   CREATININE 1.01 02/06/2023   GFR 58.86 (L) 02/06/2023   CALCIUM  10.0 02/06/2023   PROT 7.1 02/06/2023   ALBUMIN 4.6 02/06/2023   LABGLOB 2.4 04/02/2018   AGRATIO 1.9 04/02/2018   BILITOT 1.1 02/06/2023   ALKPHOS 103 02/06/2023   AST 32 02/06/2023   ALT 29 02/06/2023   ANIONGAP 12 10/17/2022   Last lipids Lab Results  Component Value Date   CHOL 142 02/06/2023   HDL 37.40 (L) 02/06/2023   LDLCALC 71 02/06/2023   LDLDIRECT 97.0 01/17/2022   TRIG 166.0  (H) 02/06/2023   CHOLHDL 4 02/06/2023   Last hemoglobin A1c Lab Results  Component Value Date   HGBA1C 5.4 02/06/2023   Last thyroid  functions Lab Results  Component Value Date   TSH 1.30 02/06/2023   Last vitamin D  Lab Results  Component Value Date   VD25OH 32.72 02/06/2023   Last vitamin B12 and Folate Lab Results  Component Value Date   VITAMINB12 1,405 (H) 02/06/2023      The 10-year ASCVD risk score (Arnett DK, et al., 2019) is: 4.1%    Assessment & Plan:   Problem List Items Addressed This Visit       Unprioritized   Vitamin D  deficiency   Relevant Medications   Vitamin D , Ergocalciferol , (DRISDOL ) 1.25 MG (50000 UNIT) CAPS capsule   Other Relevant Orders   VITAMIN D  25 Hydroxy (Vit-D Deficiency, Fractures)   Leg cramps   Check labs  Pt is on potassium supplement        Hyperlipidemia   Encourage heart healthy diet such as MIND or DASH diet, increase exercise, avoid trans fats, simple carbohydrates and processed foods, consider a krill or fish or flaxseed oil cap daily.        Relevant Medications   hydrochlorothiazide  (HYDRODIURIL ) 25 MG tablet   Other Relevant Orders   CBC with Differential/Platelet   Comprehensive metabolic panel with GFR   Lipid panel   TSH   Essential hypertension - Primary   Well controlled, no changes to meds. Encouraged heart healthy diet such as the DASH diet and exercise as tolerated.        Relevant Medications   hydrochlorothiazide  (HYDRODIURIL ) 25 MG tablet   Other Relevant Orders   CBC with Differential/Platelet   Comprehensive metabolic panel with GFR   Lipid panel   TSH   Class 1 obesity due to excess calories with serious comorbidity and body mass index (BMI) of 31.0 to 31.9 in adult   Pt taking mounjaro      Relevant Medications   tirzepatide (MOUNJARO) 5 MG/0.5ML Pen   Other Visit Diagnoses       Vitamin B12 deficiency       Relevant Orders   Vitamin B12     Bilateral edema of lower extremity        Relevant Medications   hydrochlorothiazide  (HYDRODIURIL ) 25 MG tablet     Need for pneumococcal 20-valent conjugate vaccination       Relevant Orders   Pneumococcal conjugate vaccine 20-valent (Prevnar 20) (Completed)     Assessment and Plan Assessment & Plan Hypertension   Chronic hypertension is managed with hydrochlorothiazide . She reports issues with prescription refills. Current home blood pressure is 100/70 mmHg and she is asymptomatic. Discussed monitoring for hypotensive symptoms such as dizziness or lightheadedness. Resend  hydrochlorothiazide  prescription to pharmacy. Advise home blood pressure monitoring and report if it drops below 100/70 mmHg or if dizziness or lightheadedness occur.  Hyperlipidemia   She is on rosuvastatin  for hyperlipidemia and has issues with prescription refills. Plan to assess cholesterol levels with current lab work to determine if medication adjustment is needed. Resend rosuvastatin  prescription to pharmacy. Review cholesterol levels from lab work and consider adjusting rosuvastatin  dose if LDL is very low.  Obesity   She is actively managing weight with Mounjaro and has lost 30 pounds. Current dose is 5 mg, with plans to increase to 30 mg. The goal is to lose a total of 55 pounds and reach 150 pounds. Discussed potential discontinuation of antihypertensive medication with continued weight loss, though discontinuation of cholesterol medication is uncertain. Continue Mounjaro at current dose and plan to increase to 30 mg as scheduled. Encourage continued weight loss efforts.  General Health Maintenance   She is up to date with most vaccinations. Discussed pneumonia vaccine, recommended for individuals over 50. She is considering it, as it is a one-time vaccine with minimal side effects such as redness at the injection site. Administer pneumonia vaccine. Schedule 'Welcome to Medicare' wellness visit within the first 12 months of Medicare  enrollment.  Follow-up   She has transitioned to [facility] and is considering differences between Original Medicare and Medicare Advantage plans. She chose Original Medicare due to broader provider access. Perform blood work today. Schedule regular Medicare annual wellness visit in November.    No follow-ups on file.    Cailen Mihalik R Lowne Chase, DO

## 2023-08-15 NOTE — Assessment & Plan Note (Signed)
 Pt taking mounjaro

## 2023-08-15 NOTE — Assessment & Plan Note (Signed)
 Check labs  Pt is on potassium supplement

## 2023-08-15 NOTE — Assessment & Plan Note (Signed)
 Well controlled, no changes to meds. Encouraged heart healthy diet such as the DASH diet and exercise as tolerated.

## 2023-08-16 ENCOUNTER — Ambulatory Visit: Payer: Self-pay | Admitting: Family Medicine

## 2023-09-24 ENCOUNTER — Other Ambulatory Visit: Payer: Self-pay | Admitting: Family Medicine

## 2023-09-24 DIAGNOSIS — E785 Hyperlipidemia, unspecified: Secondary | ICD-10-CM

## 2024-02-12 ENCOUNTER — Other Ambulatory Visit (HOSPITAL_BASED_OUTPATIENT_CLINIC_OR_DEPARTMENT_OTHER): Payer: Self-pay | Admitting: Family Medicine

## 2024-02-12 ENCOUNTER — Ambulatory Visit: Admitting: Family Medicine

## 2024-02-12 ENCOUNTER — Encounter: Payer: Self-pay | Admitting: Family Medicine

## 2024-02-12 VITALS — BP 98/70 | HR 68 | Temp 97.8°F | Resp 18 | Ht 64.0 in | Wt 162.8 lb

## 2024-02-12 DIAGNOSIS — Z0000 Encounter for general adult medical examination without abnormal findings: Secondary | ICD-10-CM

## 2024-02-12 DIAGNOSIS — R600 Localized edema: Secondary | ICD-10-CM | POA: Diagnosis not present

## 2024-02-12 DIAGNOSIS — Z23 Encounter for immunization: Secondary | ICD-10-CM

## 2024-02-12 DIAGNOSIS — Z1231 Encounter for screening mammogram for malignant neoplasm of breast: Secondary | ICD-10-CM

## 2024-02-12 DIAGNOSIS — I159 Secondary hypertension, unspecified: Secondary | ICD-10-CM | POA: Diagnosis not present

## 2024-02-12 DIAGNOSIS — E785 Hyperlipidemia, unspecified: Secondary | ICD-10-CM | POA: Diagnosis not present

## 2024-02-12 DIAGNOSIS — E538 Deficiency of other specified B group vitamins: Secondary | ICD-10-CM | POA: Diagnosis not present

## 2024-02-12 DIAGNOSIS — E559 Vitamin D deficiency, unspecified: Secondary | ICD-10-CM

## 2024-02-12 DIAGNOSIS — E2839 Other primary ovarian failure: Secondary | ICD-10-CM

## 2024-02-12 DIAGNOSIS — I10 Essential (primary) hypertension: Secondary | ICD-10-CM

## 2024-02-12 LAB — COMPREHENSIVE METABOLIC PANEL WITH GFR
ALT: 12 U/L (ref 0–35)
AST: 20 U/L (ref 0–37)
Albumin: 4.5 g/dL (ref 3.5–5.2)
Alkaline Phosphatase: 102 U/L (ref 39–117)
BUN: 16 mg/dL (ref 6–23)
CO2: 32 meq/L (ref 19–32)
Calcium: 10 mg/dL (ref 8.4–10.5)
Chloride: 99 meq/L (ref 96–112)
Creatinine, Ser: 1.06 mg/dL (ref 0.40–1.20)
GFR: 55.15 mL/min — ABNORMAL LOW (ref >60.00)
Glucose, Bld: 82 mg/dL (ref 70–99)
Potassium: 4 meq/L (ref 3.5–5.1)
Sodium: 139 meq/L (ref 135–145)
Total Bilirubin: 1.1 mg/dL (ref 0.2–1.2)
Total Protein: 6.8 g/dL (ref 6.0–8.3)

## 2024-02-12 LAB — VITAMIN B12: Vitamin B-12: 871 pg/mL (ref 211–911)

## 2024-02-12 LAB — LIPID PANEL
Cholesterol: 169 mg/dL (ref 0–200)
HDL: 49.1 mg/dL (ref >39.00)
LDL Cholesterol: 93 mg/dL (ref 0–99)
NonHDL: 120.02
Total CHOL/HDL Ratio: 3
Triglycerides: 137 mg/dL (ref 0.0–149.0)
VLDL: 27.4 mg/dL (ref 0.0–40.0)

## 2024-02-12 LAB — CBC WITH DIFFERENTIAL/PLATELET
Basophils Absolute: 0 K/uL (ref 0.0–0.1)
Basophils Relative: 0.6 % (ref 0.0–3.0)
Eosinophils Absolute: 0.1 K/uL (ref 0.0–0.7)
Eosinophils Relative: 1.3 % (ref 0.0–5.0)
HCT: 41.4 % (ref 36.0–46.0)
Hemoglobin: 14.3 g/dL (ref 12.0–15.0)
Lymphocytes Relative: 30.3 % (ref 12.0–46.0)
Lymphs Abs: 1.6 K/uL (ref 0.7–4.0)
MCHC: 34.5 g/dL (ref 30.0–36.0)
MCV: 91.2 fl (ref 78.0–100.0)
Monocytes Absolute: 0.4 K/uL (ref 0.1–1.0)
Monocytes Relative: 6.8 % (ref 3.0–12.0)
Neutro Abs: 3.3 K/uL (ref 1.4–7.7)
Neutrophils Relative %: 61 % (ref 43.0–77.0)
Platelets: 230 K/uL (ref 150.0–400.0)
RBC: 4.54 Mil/uL (ref 3.87–5.11)
RDW: 13.6 % (ref 11.5–15.5)
WBC: 5.3 K/uL (ref 4.0–10.5)

## 2024-02-12 LAB — TSH: TSH: 1.54 u[IU]/mL (ref 0.35–5.50)

## 2024-02-12 LAB — VITAMIN D 25 HYDROXY (VIT D DEFICIENCY, FRACTURES): VITD: 61.82 ng/mL (ref 30.00–100.00)

## 2024-02-12 NOTE — Assessment & Plan Note (Addendum)
 Well controlled, no changes to meds. Encouraged heart healthy diet such as the DASH diet and exercise as tolerated.   Can stop hydrochlorothiazide  and take as needed

## 2024-02-12 NOTE — Progress Notes (Signed)
 Chief Complaint  Patient presents with   welcome to medicare     Subjective:   Tami Monroe is a 63 y.o. female who presents for a Welcome to Medicare Exam.   Allergies (verified) Patient has no known allergies.   History: Past Medical History:  Diagnosis Date   Acute maxillary sinusitis, unspecified 04/11/2022   Allergy    Arthritis    HIP,KNEE   Class 1 obesity due to excess calories with serious comorbidity and body mass index (BMI) of 31.0 to 31.9 in adult 01/31/2016   Constipation    Essential hypertension 04/04/2018   Hyperlipidemia    Hypertension    Joint pain    Lower extremity edema 01/31/2016   Preventative health care 01/17/2022   SI (sacroiliac) joint dysfunction 01/04/2018   Vitamin D  deficiency 05/15/2018   Past Surgical History:  Procedure Laterality Date   CHOLECYSTECTOMY  03/14/2009   COLONOSCOPY     Family History  Problem Relation Age of Onset   Arthritis Mother    Hyperlipidemia Mother    Heart disease Mother    Hypertension Mother    Diabetes Mother    Bladder Cancer Mother 93   Depression Mother    Obesity Mother    Lung cancer Father        smoker   Alcohol abuse Father    Obesity Father    Diabetes Sister 73   Vision loss Sister        optic nerve stroke   Stroke Sister    Diabetes Mellitus II Brother 60   Heart disease Brother    Crohn's disease Brother 26   Cancer Other        Ureter cancer   Colon cancer Neg Hx    Colon polyps Neg Hx    Esophageal cancer Neg Hx    Rectal cancer Neg Hx    Stomach cancer Neg Hx    Ulcerative colitis Neg Hx    Social History   Occupational History   Occupation: production designer, theatre/television/film in [employer]: [employer]    Comment: retired 2021  Tobacco Use   Smoking status: Never   Smokeless tobacco: Never  Vaping Use   Vaping status: Never Used  Substance and Sexual Activity   Alcohol use: Yes    Comment: OCC   Drug use: No   Sexual activity: Not Currently    Partners: Male    Tobacco Counseling Counseling given: Not Answered  SDOH Screenings   Food Insecurity: No Food Insecurity (02/11/2024)  Housing: Unknown (02/11/2024)  Transportation Needs: No Transportation Needs (02/11/2024)  Alcohol Screen: Low Risk  (02/11/2024)  Depression (PHQ2-9): Low Risk  (02/12/2024)  Financial Resource Strain: Low Risk  (02/11/2024)  Physical Activity: Sufficiently Active (02/11/2024)  Social Connections: Moderately Integrated (02/11/2024)  Stress: No Stress Concern Present (02/11/2024)  Tobacco Use: Low Risk  (02/12/2024)   See flowsheets for full screening details  Depression Screen PHQ 2 & 9 Depression Scale- Over the past 2 weeks, how often have you been bothered by any of the following problems? Little interest or pleasure in doing things: 0 Feeling down, depressed, or hopeless (PHQ Adolescent also includes...irritable): 0 PHQ-2 Total Score: 0 Trouble falling or staying asleep, or sleeping too much: 0 Feeling tired or having little energy: 0 Poor appetite or overeating (PHQ Adolescent also includes...weight loss): 0 Trouble concentrating on things, such as reading the newspaper or watching television [facility] Adolescent also includes...like school work): 0 Moving or speaking  so slowly that other people could have noticed. Or the opposite - being so fidgety or restless that you have been moving around a lot more than usual: 0 Thoughts that you would be better off dead, or of hurting yourself in some way: 0 PHQ-9 Total Score: 0 If you checked off any problems, how difficult have these problems made it for you to do your work, take care of things at home, or get along with other people?: Not difficult at all      Goals Addressed   None    Functional Status Activities of Daily Living (to include ambulation/medication): (Patient-Rptd) Independent Ambulation: (Patient-Rptd) Independent Home Management: (Patient-Rptd) Independent  Fall Screening Falls in the past  year?: 0 Number of falls in past year: 0 Was there an injury with Fall?: 0 Fall Risk Category Calculator: 0 Patient Fall Risk Level: Low Fall Risk  Fall Risk Fall risk Follow up: Falls evaluation completed  Advance Directives (For Healthcare) Does Patient Have a Medical Advance Directive?: Yes Does patient want to make changes to medical advance directive?: Yes (MAU/Ambulatory/Procedural Areas - Information given) Type of Advance Directive: Healthcare Power of [place]; Living will Copy of Healthcare Power of Attorney in Chart?: No - copy requested Copy of Living Will in Chart?: No - copy requested         Objective:    Today's Vitals   02/12/24 0936  BP: 98/70  Pulse: 68  Resp: 18  Temp: 97.8 F (36.6 C)  TempSrc: Oral  SpO2: 96%  Weight: 162 lb 12.8 oz (73.8 kg)  Height: 5' 4 (1.626 m)   Body mass index is 27.94 kg/m.   Physical Exam Vitals and nursing note reviewed.  Constitutional:      General: She is not in acute distress.    Appearance: Normal appearance. She is well-developed.  HENT:     Head: Normocephalic and atraumatic.     Right Ear: Tympanic membrane, ear canal and external ear normal. There is no impacted cerumen.     Left Ear: Tympanic membrane, ear canal and external ear normal. There is no impacted cerumen.     Nose: Nose normal.     Mouth/Throat:     Mouth: Mucous membranes are moist.     Pharynx: Oropharynx is clear. No oropharyngeal exudate or posterior oropharyngeal erythema.  Eyes:     General: No scleral icterus.       Right eye: No discharge.        Left eye: No discharge.     Conjunctiva/sclera: Conjunctivae normal.     Pupils: Pupils are equal, round, and reactive to light.  Neck:     Thyroid : No thyromegaly or thyroid  tenderness.     Vascular: No JVD.  Cardiovascular:     Rate and Rhythm: Normal rate and regular rhythm.     Heart sounds: Normal heart sounds. No murmur heard. Pulmonary:     Effort: Pulmonary effort is normal. No  respiratory distress.     Breath sounds: Normal breath sounds.  Abdominal:     General: Bowel sounds are normal. There is no distension.     Palpations: Abdomen is soft. There is no mass.     Tenderness: There is no abdominal tenderness. There is no guarding or rebound.  Musculoskeletal:        General: Normal range of motion.     Cervical back: Normal range of motion and neck supple.     Right lower leg: No edema.  Left lower leg: No edema.  Lymphadenopathy:     Cervical: No cervical adenopathy.  Skin:    General: Skin is warm and dry.     Findings: No erythema or rash.  Neurological:     Mental Status: She is alert and oriented to person, place, and time.     Cranial Nerves: No cranial nerve deficit.     Deep Tendon Reflexes: Reflexes are normal and symmetric.  Psychiatric:        Mood and Affect: Mood normal.        Behavior: Behavior normal.        Thought Content: Thought content normal.        Judgment: Judgment normal.    Current Medications (verified) Outpatient Encounter Medications as of 02/12/2024  Medication Sig   hydrochlorothiazide  (HYDRODIURIL ) 25 MG tablet Take 1 tablet (25 mg total) by mouth daily as needed.   ibuprofen (ADVIL,MOTRIN) 200 MG tablet Take 200 mg by mouth as needed for headache or mild pain.   Multiple Vitamins-Minerals (ICAPS AREDS 2 PO) Take 1 tablet by mouth daily.   rosuvastatin  (CRESTOR ) 20 MG tablet TAKE 1 TABLET BY MOUTH EVERY DAY   tirzepatide (MOUNJARO) 5 MG/0.5ML Pen Inject 5 mg into the skin once a week.   Vitamin D , Ergocalciferol , (DRISDOL ) 1.25 MG (50000 UNIT) CAPS capsule Take 1 capsule (50,000 Units total) by mouth every 7 (seven) days.   No facility-administered encounter medications on file as of 02/12/2024.   Hearing/Vision screen Vision Screening   Right eye Left eye Both eyes  Without correction     With correction 20/20 20/20 20/20   Hearing Screening - Comments:: Normal whisper Immunizations and Health  Maintenance Health Maintenance  Topic Date Due   Medicare Annual Wellness (AWV)  Never done   HIV Screening  Never done   COVID-19 Vaccine (6 - 2025-26 season) 11/13/2023   Bone Density Scan  01/19/2024   Mammogram  02/13/2024   DTaP/Tdap/Td (3 - Td or Tdap) 10/08/2025   Colonoscopy  10/27/2027   Pneumococcal Vaccine: 50+ Years  Completed   Influenza Vaccine  Completed   Hepatitis B Vaccines 19-59 Average Risk  Completed   Hepatitis C Screening  Completed   Zoster Vaccines- Shingrix   Completed   Meningococcal B Vaccine  Aged Out    EKG: unchanged from previous tracings--2024     Assessment/Plan:  This is a routine wellness examination for Kailah.  Patient Care Team: Antonio Meth, Jamee SAUNDERS, DO as PCP - General (Family Medicine) Beverley Perkins, PA-C (Dermatology)  I have personally reviewed and noted the following in the patient's chart:   Medical and social history Use of alcohol, tobacco or illicit drugs  Current medications and supplements including opioid prescriptions. Functional ability and status Nutritional status Physical activity Advanced directives List of other physicians Hospitalizations, surgeries, and ER visits in previous 12 months Vitals Screenings to include cognitive, depression, and falls Referrals and appointments  Orders Placed This Encounter  Procedures   DG Bone Density    Standing Status:   Future    Expiration Date:   02/11/2025    Reason for Exam (SYMPTOM  OR DIAGNOSIS REQUIRED):   estrogen def    Preferred imaging location?:   [facility]    Release to patient:   Immediate   Flu vaccine HIGH DOSE PF(Fluzone Trivalent)   CBC with Differential/Platelet   Comprehensive metabolic panel with GFR   Lipid panel   TSH   Vitamin B12   VITAMIN D   25 Hydroxy (Vit-D Deficiency, Fractures)   EKG 12-Lead   In addition, I have reviewed and discussed with patient certain preventive protocols, quality metrics, and best practice recommendations.  A written personalized care plan for preventive services as well as general preventive health recommendations were provided to patient.  Assessment and Plan Assessment & Plan Adult Wellness Visit   During her routine adult wellness visit, there were no significant changes in family history or new surgeries. She engages in regular physical activity, walking five days a week for 45 minutes. There are no signs of depression or memory issues. Her vision is 20/20 with glasses, and she reports no joint or stomach issues. A weight loss of 44 pounds was achieved with tirzepatide (Mounjaro). A mammogram and bone density scan are scheduled. She is encouraged to continue regular physical activity. The potential transition to Mounjaro under Medicare coverage was discussed.  Immunization management   She was due for a flu shot today, which was administered. COVID-19 vaccination is recommended for those 65 and over, though not mandatory. Potential side effects of receiving both flu and COVID-19 vaccines simultaneously were discussed. The COVID-19 vaccination was offered at the facility.  Essential hypertension   Her blood pressure was low today, under 100 mmHg. The potential discontinuation of HCTZ was discussed if blood pressure remains stable. She is advised to monitor her blood pressure at home to ensure stability. Discontinue HCTZ if no swelling occurs. She should purchase an arm blood pressure monitor for home use and monitor her blood pressure a couple of times a week or when feeling unwell.  Vitamin D  deficiency   She is currently taking vitamin D  supplements and consumes yogurt regularly for calcium  intake. Continue vitamin D  supplementation.  Deficiency of B group vitamins   She requires B12 supplementation.  Assessment and Plan Assessment & Plan Adult Wellness Visit   During her routine adult wellness visit, there were no significant changes in family history or new surgeries. She engages in  regular physical activity, walking five days a week for 45 minutes. There are no signs of depression or memory issues. Her vision is 20/20 with glasses, and she reports no joint or stomach issues. A weight loss of 44 pounds was achieved with tirzepatide (Mounjaro). A mammogram and bone density scan are scheduled. She is encouraged to continue regular physical activity. The potential transition to Mounjaro under Medicare coverage was discussed.  Immunization management   She was due for a flu shot today, which was administered. COVID-19 vaccination is recommended for those 65 and over, though not mandatory. Potential side effects of receiving both flu and COVID-19 vaccines simultaneously were discussed. The COVID-19 vaccination was offered at the facility.  Essential hypertension   Her blood pressure was low today, under 100 mmHg. The potential discontinuation of HCTZ was discussed if blood pressure remains stable. She is advised to monitor her blood pressure at home to ensure stability. Discontinue HCTZ if no swelling occurs. She should purchase an arm blood pressure monitor for home use and monitor her blood pressure a couple of times a week or when feeling unwell.  Vitamin D  deficiency   She is currently taking vitamin D  supplements and consumes yogurt regularly for calcium  intake. Continue vitamin D  supplementation.  Deficiency of B group vitamins   She requires B12 supplementation. Assessment and Plan Assessment & Plan Adult Wellness Visit   During her routine adult wellness visit, there were no significant changes in family history or new surgeries. She engages in regular  physical activity, walking five days a week for 45 minutes. There are no signs of depression or memory issues. Her vision is 20/20 with glasses, and she reports no joint or stomach issues. A weight loss of 44 pounds was achieved with tirzepatide (Mounjaro). A mammogram and bone density scan are scheduled. She is encouraged to  continue regular physical activity. The potential transition to Mounjaro under Medicare coverage was discussed.  Immunization management   She was due for a flu shot today, which was administered. COVID-19 vaccination is recommended for those 65 and over, though not mandatory. Potential side effects of receiving both flu and COVID-19 vaccines simultaneously were discussed. The COVID-19 vaccination was offered at the facility.  Essential hypertension   Her blood pressure was low today, under 100 mmHg. The potential discontinuation of HCTZ was discussed if blood pressure remains stable. She is advised to monitor her blood pressure at home to ensure stability. Discontinue HCTZ if no swelling occurs. She should purchase an arm blood pressure monitor for home use and monitor her blood pressure a couple of times a week or when feeling unwell.  Vitamin D  deficiency   She is currently taking vitamin D  supplements and consumes yogurt regularly for calcium  intake. Continue vitamin D  supplementation.  Deficiency of B group vitamins   She requires B12 supplementation.     Niall Illes R Lowne Chase, DO   02/12/2024   Return in about 6 months (around 08/12/2024).

## 2024-02-12 NOTE — Assessment & Plan Note (Signed)
 Well controlled, no changes to meds. Encouraged heart healthy diet such as the DASH diet and exercise as tolerated.

## 2024-02-12 NOTE — Assessment & Plan Note (Signed)
 Ghm utd Check labs  See AVS Health Maintenance  Topic Date Due   Medicare Annual Wellness (AWV)  Never done   HIV Screening  Never done   COVID-19 Vaccine (6 - 2025-26 season) 11/13/2023   Bone Density Scan  01/19/2024   Mammogram  02/13/2024   DTaP/Tdap/Td (3 - Td or Tdap) 10/08/2025   Colonoscopy  10/27/2027   Pneumococcal Vaccine: 50+ Years  Completed   Influenza Vaccine  Completed   Hepatitis B Vaccines 19-59 Average Risk  Completed   Hepatitis C Screening  Completed   Zoster Vaccines- Shingrix   Completed   Meningococcal B Vaccine  Aged Out

## 2024-02-12 NOTE — Assessment & Plan Note (Signed)
 Encourage heart healthy diet such as MIND or DASH diet, increase exercise, avoid trans fats, simple carbohydrates and processed foods, consider a krill or fish or flaxseed oil cap daily.

## 2024-02-12 NOTE — Assessment & Plan Note (Signed)
 Hydrochlorothiazide  as needed

## 2024-02-12 NOTE — Patient Instructions (Signed)
 Preventive Care 83 Years and Older, Female Preventive care refers to lifestyle choices and visits with your health care provider that can promote health and wellness. Preventive care visits are also called wellness exams. What can I expect for my preventive care visit? Counseling Your health care provider may ask you questions about your: Medical history, including: Past medical problems. Family medical history. Pregnancy and menstrual history. History of falls. Current health, including: Memory and ability to understand (cognition). Emotional well-being. Home life and relationship well-being. Sexual activity and sexual health. Lifestyle, including: Alcohol, nicotine or tobacco, and drug use. Access to firearms. Diet, exercise, and sleep habits. Work and work Astronomer. Sunscreen use. Safety issues such as seatbelt and bike helmet use. Physical exam Your health care provider will check your: Height and weight. These may be used to calculate your BMI (body mass index). BMI is a measurement that tells if you are at a healthy weight. Waist circumference. This measures the distance around your waistline. This measurement also tells if you are at a healthy weight and may help predict your risk of certain diseases, such as type 2 diabetes and high blood pressure. Heart rate and blood pressure. Body temperature. Skin for abnormal spots. What immunizations do I need?  Vaccines are usually given at various ages, according to a schedule. Your health care provider will recommend vaccines for you based on your age, medical history, and lifestyle or other factors, such as travel or where you work. What tests do I need? Screening Your health care provider may recommend screening tests for certain conditions. This may include: Lipid and cholesterol levels. Hepatitis C test. Hepatitis B test. HIV (human immunodeficiency virus) test. STI (sexually transmitted infection) testing, if you are at  risk. Lung cancer screening. Colorectal cancer screening. Diabetes screening. This is done by checking your blood sugar (glucose) after you have not eaten for a while (fasting). Mammogram. Talk with your health care provider about how often you should have regular mammograms. BRCA-related cancer screening. This may be done if you have a family history of breast, ovarian, tubal, or peritoneal cancers. Bone density scan. This is done to screen for osteoporosis. Talk with your health care provider about your test results, treatment options, and if necessary, the need for more tests. Follow these instructions at home: Eating and drinking  Eat a diet that includes fresh fruits and vegetables, whole grains, lean protein, and low-fat dairy products. Limit your intake of foods with high amounts of sugar, saturated fats, and salt. Take vitamin and mineral supplements as recommended by your health care provider. Do not drink alcohol if your health care provider tells you not to drink. If you drink alcohol: Limit how much you have to 0-1 drink a day. Know how much alcohol is in your drink. In the [place] one drink equals one 12 oz bottle of beer (355 mL), one 5 oz glass of wine (148 mL), or one 1 oz glass of hard liquor (44 mL). Lifestyle Brush your teeth every morning and night with fluoride toothpaste. Floss one time each day. Exercise for at least 30 minutes 5 or more days each week. Do not use any products that contain nicotine or tobacco. These products include cigarettes, chewing tobacco, and vaping devices, such as e-cigarettes. If you need help quitting, ask your health care provider. Do not use drugs. If you are sexually active, practice safe sex. Use a condom or other form of protection in order to prevent STIs. Take aspirin only as told by  your health care provider. Make sure that you understand how much to take and what form to take. Work with your health care provider to find out whether it  is safe and beneficial for you to take aspirin daily. Ask your health care provider if you need to take a cholesterol-lowering medicine (statin). Find healthy ways to manage stress, such as: Meditation, yoga, or listening to music. Journaling. Talking to a trusted person. Spending time with friends and family. Minimize exposure to UV radiation to reduce your risk of skin cancer. Safety Always wear your seat belt while driving or riding in a vehicle. Do not drive: If you have been drinking alcohol. Do not ride with someone who has been drinking. When you are tired or distracted. While texting. If you have been using any mind-altering substances or drugs. Wear a helmet and other protective equipment during sports activities. If you have firearms in your house, make sure you follow all gun safety procedures. What's next? Visit your health care provider once a year for an annual wellness visit. Ask your health care provider how often you should have your eyes and teeth checked. Stay up to date on all vaccines. This information is not intended to replace advice given to you by your health care provider. Make sure you discuss any questions you have with your health care provider. Document Revised: 08/26/2020 Document Reviewed: 08/26/2020 Elsevier Patient Education  2024 [employer].

## 2024-02-21 ENCOUNTER — Ambulatory Visit: Payer: Self-pay | Admitting: Family Medicine

## 2024-02-22 ENCOUNTER — Ambulatory Visit: Payer: Self-pay

## 2024-02-22 NOTE — Telephone Encounter (Signed)
 FYI Only or Action Required?: FYI only for provider: appointment scheduled on 12/12.  Patient was last seen in primary care on 02/12/2024 by Antonio Meth, Jamee SAUNDERS, DO.  Called Nurse Triage reporting Urinary Frequency.  Symptoms began several days ago.  Interventions attempted: Nothing.  Symptoms are: unchanged.  Triage Disposition: See PCP Within 2 Weeks  Patient/caregiver understands and will follow disposition?: Yes            Message from [place] G sent at 02/22/2024  8:08 AM EST  Summary: Possible UTI   Reason for Triage: Patient called in stating she is having cloudy urine, pressure and she thinks she has a UTI that has been for a few days now. Patient is requesting antibiotics and can be reached for a call back at [phone].      Reason for Disposition  All other urine symptoms  Answer Assessment - Initial Assessment Questions 1. SYMPTOM: What's the main symptom you're concerned about? (e.g., frequency, incontinence)      Pressure, urge, cloudy urine   2. ONSET: When did the  symptoms  start?     X 5 days  3. PAIN: Is there any pain? If Yes, ask: How bad is it? (Scale: 1-10; mild, moderate, severe)     No pain  4. CAUSE: What do you think is causing the symptoms?     Possible UTI   5. OTHER SYMPTOMS: Do you have any other symptoms? (e.g., blood in urine, fever, flank pain, pain with urination)  No    Patient called in to triage with complaints of cloudy urine, pressure, and urge to urinate This has been ongoing for a few days  The patient stated she also has a mild headache For home care, the patient is not taking any OTC medications. Appointment scheduled for further evaluation; and agrees with the plan of care, and will reach out if symptoms worsen or persist.  Protocols used: Urinary Symptoms-A-AH

## 2024-02-22 NOTE — Telephone Encounter (Signed)
 Appt scheduled

## 2024-02-23 ENCOUNTER — Ambulatory Visit (INDEPENDENT_AMBULATORY_CARE_PROVIDER_SITE_OTHER): Admitting: Medical

## 2024-02-23 VITALS — BP 116/78 | HR 66 | Temp 97.7°F | Resp 16 | Ht 64.0 in | Wt 164.2 lb

## 2024-02-23 DIAGNOSIS — R319 Hematuria, unspecified: Secondary | ICD-10-CM

## 2024-02-23 DIAGNOSIS — R350 Frequency of micturition: Secondary | ICD-10-CM

## 2024-02-23 DIAGNOSIS — R1024 Suprapubic pain: Secondary | ICD-10-CM | POA: Diagnosis not present

## 2024-02-23 DIAGNOSIS — R8290 Unspecified abnormal findings in urine: Secondary | ICD-10-CM | POA: Diagnosis not present

## 2024-02-23 LAB — POCT URINALYSIS DIPSTICK
Bilirubin, UA: NEGATIVE
Glucose, UA: NEGATIVE
Ketones, UA: NEGATIVE
Nitrite, UA: NEGATIVE
Protein, UA: POSITIVE — AB
Spec Grav, UA: 1.015 (ref 1.010–1.025)
Urobilinogen, UA: 0.2 U/dL
pH, UA: 5 (ref 5.0–8.0)

## 2024-02-23 MED ORDER — CEPHALEXIN 500 MG PO CAPS: 500.0000 mg | ORAL_CAPSULE | Freq: Two times a day (BID) | ORAL | 0 refills | Status: AC

## 2024-02-23 NOTE — Progress Notes (Signed)
 Subjective:    Patient ID: Tami Monroe, female    DOB: 07/14/1960, 63 y.o.   MRN: [id number]  HPI  Tami Monroe is a 63 year old female who presents with urinary symptoms.  She developed urinary symptoms earlier this week with bladder pressure and constant urgency, including when the bladder feels empty. Since yesterday morning her urine has been cloudy. She denies dysuria, foul odor, fever, chills, sweats, or flank pain. She has recurrent urinary infections, though none in the past several years.  She had a single episode of diarrhea last Friday night after a meal, which resolved by the next day.  She has had intermittent mild evening headaches over the past few days but has no headache now. She stopped her blood pressure medication last week after discussing it with Dr. Antonio because her home blood pressure readings have been under 116/78.    Review of Systems  Constitutional:  Negative for fatigue and fever.  Gastrointestinal:  Negative for abdominal pain and vomiting.  Genitourinary:  Positive for frequency and urgency. Negative for difficulty urinating, pelvic pain and vaginal pain.  Neurological:  Negative for dizziness and headaches.       None pressently  Hematological:  Negative for adenopathy.    Past Medical History:  Diagnosis Date   Acute maxillary sinusitis, unspecified 04/11/2022   Allergy    Arthritis    HIP,KNEE   Class 1 obesity due to excess calories with serious comorbidity and body mass index (BMI) of 31.0 to 31.9 in adult 01/31/2016   Constipation    Essential hypertension 04/04/2018   Hyperlipidemia    Hypertension    Joint pain    Lower extremity edema 01/31/2016   Preventative health care 01/17/2022   SI (sacroiliac) joint dysfunction 01/04/2018   Vitamin D  deficiency 05/15/2018     Social History   Socioeconomic History   Marital status: Single    Spouse name: Not on file   Number of children: Not on file   Years of education: Not on file    Highest education level: Bachelor's degree (e.g., BA, AB, BS)  Occupational History   Occupation: production designer, theatre/television/film in customers    Employer: [employer]    Comment: retired 2021  Tobacco Use   Smoking status: Never   Smokeless tobacco: Never  Vaping Use   Vaping status: Never Used  Substance and Sexual Activity   Alcohol use: Yes    Comment: OCC   Drug use: No   Sexual activity: Not Currently    Partners: Male  Other Topics Concern   Not on file  Social History Narrative   -Exercise--- walking 5 days a week  45 min   Social Drivers of Health   Tobacco Use: Low Risk (02/12/2024)   Patient History    Smoking Tobacco Use: Never    Smokeless Tobacco Use: Never    Passive Exposure: Not on file  Financial Resource Strain: Low Risk (02/11/2024)   Overall Financial Resource Strain (CARDIA)    Difficulty of Paying Living Expenses: Not hard at all  Food Insecurity: No Food Insecurity (02/11/2024)   Epic    Worried About Programme Researcher, Broadcasting/film/video in the Last Year: Never true    Ran Out of Food in the Last Year: Never true  Transportation Needs: No Transportation Needs (02/11/2024)   Epic    Lack of Transportation (Medical): No    Lack of Transportation (Non-Medical): No  Physical Activity: Sufficiently Active (02/11/2024)   Exercise Vital  Sign    Days of Exercise per Week: 4 days    Minutes of Exercise per Session: 50 min  Stress: No Stress Concern Present (02/11/2024)   [employer] of Occupational Health - Occupational Stress Questionnaire    Feeling of Stress: Only a little  Social Connections: Moderately Integrated (02/11/2024)   Social Connection and Isolation Panel    Frequency of Communication with Friends and Family: More than three times a week    Frequency of Social Gatherings with Friends and Family: Twice a week    Attends Religious Services: More than 4 times per year    Active Member of [employer] or Organizations: Yes    Attends Banker Meetings: More  than 4 times per year    Marital Status: Never married  Intimate Partner Violence: Unknown (06/17/2021)   Received from [facility]   HITS    Physically Hurt: Not on file    Insult or Talk Down To: Not on file    Threaten Physical Harm: Not on file    Scream or Curse: Not on file  Depression (PHQ2-9): Low Risk (02/12/2024)   Depression (PHQ2-9)    PHQ-2 Score: 0  Alcohol Screen: Low Risk (02/11/2024)   Alcohol Screen    Last Alcohol Screening Score (AUDIT): 1  Housing: Unknown (02/11/2024)   Epic    Unable to Pay for Housing in the Last Year: No    Number of Times Moved in the Last Year: Not on file    Homeless in the Last Year: No  Utilities: Not on file  Health Literacy: Not on file    Past Surgical History:  Procedure Laterality Date   CHOLECYSTECTOMY  03/14/2009   COLONOSCOPY      Family History  Problem Relation Age of Onset   Arthritis Mother    Hyperlipidemia Mother    Heart disease Mother    Hypertension Mother    Diabetes Mother    Bladder Cancer Mother 47   Depression Mother    Obesity Mother    Lung cancer Father        smoker   Alcohol abuse Father    Obesity Father    Diabetes Sister 82   Vision loss Sister        optic nerve stroke   Stroke Sister    Diabetes Mellitus II Brother 60   Heart disease Brother    Crohn's disease Brother 26   Cancer Other        Ureter cancer   Colon cancer Neg Hx    Colon polyps Neg Hx    Esophageal cancer Neg Hx    Rectal cancer Neg Hx    Stomach cancer Neg Hx    Ulcerative colitis Neg Hx     Allergies[1]  Medications Ordered Prior to Encounter[2]  BP 116/78   Pulse 66   Temp 97.7 F (36.5 C) (Oral)   Resp 16   Ht 5' 4 (1.626 m)   Wt 164 lb 3.2 oz (74.5 kg)   SpO2 97%   BMI 28.18 kg/m        Objective:   Physical Exam  General Mental Status- Alert. General Appearance- Not in acute distress.   Skin General: Color- Normal Color. Moisture- Normal Moisture.  Neck Carotid Arteries- Normal  color. Moisture- Normal Moisture. No carotid bruits. No JVD.  Chest and Lung Exam Auscultation: Breath Sounds:-Normal.  Cardiovascular Auscultation:Rythm- Regular. Murmurs & Other Heart Sounds:Auscultation of the heart reveals- No Murmurs.  Abdomen Inspection:-Inspeection  Normal. Palpation/Percussion:Note:No mass. Palpation and Percussion of the abdomen reveal- Non Tender, Non Distended + BS, no rebound or guarding.   Neurologic Cranial Nerve exam:- CN III-XII intact(No nystagmus), symmetric smile. Strength:- 5/5 equal and symmetric strength both upper and lower extremities.   Back- no cva pain.     Assessment & Plan:   Suprapubic pressure, cloudy urine and mild frequent urination. suspect uti.  Urinalysis indicates infection. Differential includes UTI. No antibiotic allergies. Keflex  selected for treatment. - Prescribed Keflex  500 mg twice daily for 7 days. - Sent urine culture. - Advised hydration. - Repeat urinalysis in 2 weeks for hematuria.  Headache Intermittent evening headaches but no ha now and normal neurologic exam. Blood pressure medication stopped due to low readings. Current BP 116/78 mmHg. Headache possibly linked to BP fluctuations. - Check BP during headaches. - Use Tylenol for relief. - Monitor for recurrence or worsening and update us .  Follow up date to be determined after urine culture. Also depending on repeat UA study for blood.  Noell Shular, PA-C     [1] No Known Allergies [2]  Current Outpatient Medications on File Prior to Visit  Medication Sig Dispense Refill   hydrochlorothiazide  (HYDRODIURIL ) 25 MG tablet Take 1 tablet (25 mg total) by mouth daily as needed. 90 tablet 3   ibuprofen (ADVIL,MOTRIN) 200 MG tablet Take 200 mg by mouth as needed for headache or mild pain.     Multiple Vitamins-Minerals (ICAPS AREDS 2 PO) Take 1 tablet by mouth daily.     rosuvastatin  (CRESTOR ) 20 MG tablet TAKE 1 TABLET BY MOUTH EVERY DAY 90 tablet 1    tirzepatide (MOUNJARO) 5 MG/0.5ML Pen Inject 5 mg into the skin once a week.     Vitamin D , Ergocalciferol , (DRISDOL ) 1.25 MG (50000 UNIT) CAPS capsule Take 1 capsule (50,000 Units total) by mouth every 7 (seven) days. 12 capsule 3   No current facility-administered medications on file prior to visit.

## 2024-02-23 NOTE — Patient Instructions (Addendum)
 Suprapubic pressure, cloudy urine and mild frequent urination. Suspect uti.  Urinalysis indicates infection. Differential includes UTI. No antibiotic allergies. Keflex  selected for treatment. - Prescribed Keflex  500 mg twice daily for 7 days. - Sent urine culture. - Advised hydration. - Repeat urinalysis in 2 weeks for hematuria.  Headache Intermittent evening headaches but no ha now and normal neurologic exam. Blood pressure medication stopped due to low readings. Current BP 116/78 mmHg. Headache possibly linked to BP fluctuations. - Check BP during headaches. - Use Tylenol for relief. - Monitor for recurrence or worsening and update us .  Follow up date to be determined after urine culture. Also depending on repeat UA study for blood.

## 2024-02-25 ENCOUNTER — Ambulatory Visit: Payer: Self-pay | Admitting: Medical

## 2024-02-25 LAB — URINE CULTURE
MICRO NUMBER:: 17349391
SPECIMEN QUALITY:: ADEQUATE

## 2024-03-12 ENCOUNTER — Other Ambulatory Visit (INDEPENDENT_AMBULATORY_CARE_PROVIDER_SITE_OTHER)

## 2024-03-12 ENCOUNTER — Ambulatory Visit: Payer: Self-pay | Admitting: Family Medicine

## 2024-03-12 DIAGNOSIS — R8290 Unspecified abnormal findings in urine: Secondary | ICD-10-CM | POA: Diagnosis not present

## 2024-03-12 DIAGNOSIS — R319 Hematuria, unspecified: Secondary | ICD-10-CM | POA: Diagnosis not present

## 2024-03-12 LAB — POC URINALSYSI DIPSTICK (AUTOMATED)
Bilirubin, UA: NEGATIVE
Blood, UA: NEGATIVE
Glucose, UA: NEGATIVE
Ketones, UA: NEGATIVE
Nitrite, UA: NEGATIVE
Protein, UA: NEGATIVE
Spec Grav, UA: 1.015
Urobilinogen, UA: 0.2 U/dL
pH, UA: 5

## 2024-03-15 ENCOUNTER — Other Ambulatory Visit: Payer: Self-pay | Admitting: Family Medicine

## 2024-03-15 DIAGNOSIS — N390 Urinary tract infection, site not specified: Secondary | ICD-10-CM

## 2024-03-15 LAB — URINE CULTURE
MICRO NUMBER:: 17409817
SPECIMEN QUALITY:: ADEQUATE

## 2024-03-15 MED ORDER — NITROFURANTOIN MONOHYD MACRO 100 MG PO CAPS: 100.0000 mg | ORAL_CAPSULE | Freq: Two times a day (BID) | ORAL | 0 refills | Status: AC

## 2024-03-23 ENCOUNTER — Other Ambulatory Visit: Payer: Self-pay | Admitting: Family Medicine

## 2024-03-23 DIAGNOSIS — E785 Hyperlipidemia, unspecified: Secondary | ICD-10-CM

## 2024-03-28 ENCOUNTER — Ambulatory Visit (HOSPITAL_BASED_OUTPATIENT_CLINIC_OR_DEPARTMENT_OTHER)
Admission: RE | Admit: 2024-03-28 | Discharge: 2024-03-28 | Disposition: A | Discharge status: Home / Self Care | Source: Ambulatory Visit | Attending: Family Medicine | Admitting: Family Medicine

## 2024-03-28 ENCOUNTER — Encounter (HOSPITAL_BASED_OUTPATIENT_CLINIC_OR_DEPARTMENT_OTHER): Payer: Self-pay

## 2024-03-28 ENCOUNTER — Ambulatory Visit (HOSPITAL_BASED_OUTPATIENT_CLINIC_OR_DEPARTMENT_OTHER)
Admission: RE | Admit: 2024-03-28 | Discharge: 2024-03-28 | Disposition: A | Source: Ambulatory Visit | Attending: Family Medicine | Admitting: Family Medicine

## 2024-03-28 DIAGNOSIS — Z1231 Encounter for screening mammogram for malignant neoplasm of breast: Secondary | ICD-10-CM | POA: Diagnosis present

## 2024-03-28 DIAGNOSIS — E2839 Other primary ovarian failure: Secondary | ICD-10-CM

## 2024-08-12 ENCOUNTER — Ambulatory Visit: Admitting: Family Medicine
